# Patient Record
Sex: Male | Born: 1940 | Race: White | Hispanic: No | Smoking: Former smoker
Health system: Southern US, Community
[De-identification: ages and names within clinical notes are randomized; demographics above are authoritative.]

## PROBLEM LIST (undated history)

## (undated) DIAGNOSIS — M159 Polyosteoarthritis, unspecified: Secondary | ICD-10-CM

## (undated) HISTORY — DX: Polyosteoarthritis, unspecified: M15.9

## (undated) HISTORY — PX: TONSILLECTOMY: SUR1361

---

## 1969-06-21 HISTORY — PX: LUMBAR DISC SURGERY: SHX700

## 1989-06-21 HISTORY — PX: KNEE ARTHROSCOPY: SUR90

## 2000-12-18 ENCOUNTER — Encounter: Payer: Self-pay | Admitting: Emergency Medicine

## 2000-12-18 ENCOUNTER — Inpatient Hospital Stay (HOSPITAL_COMMUNITY): Admission: EM | Admit: 2000-12-18 | Discharge: 2000-12-23 | Payer: Self-pay | Admitting: Emergency Medicine

## 2002-04-01 ENCOUNTER — Ambulatory Visit (HOSPITAL_BASED_OUTPATIENT_CLINIC_OR_DEPARTMENT_OTHER): Admission: RE | Admit: 2002-04-01 | Discharge: 2002-04-01 | Payer: Self-pay | Admitting: Orthopedic Surgery

## 2004-06-09 ENCOUNTER — Emergency Department (HOSPITAL_COMMUNITY): Admission: EM | Admit: 2004-06-09 | Discharge: 2004-06-09 | Payer: Self-pay | Admitting: Emergency Medicine

## 2009-09-06 ENCOUNTER — Encounter: Admission: RE | Admit: 2009-09-06 | Discharge: 2009-09-06 | Payer: Self-pay | Admitting: Family Medicine

## 2011-03-08 NOTE — Op Note (Signed)
. [facility]  Patient:    Bryan Thomas, Bryan Thomas Visit Number: [id number] MRN: [id number]          Service Type: DSU Location: [facility] Attending Physician:  Cornell Barman Dictated by:   Lenard Galloway Chaney Malling, M.D. Proc. Date: 04/01/02 Admit Date:  04/01/2002 Discharge Date: 04/01/2002                             Operative Report  PREOPERATIVE DIAGNOSIS:  Internal derangement of left knee.  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  OPERATION:  Arthroscopy, partial medial meniscectomy of left knee.  SURGEON:  Lenard Galloway. Chaney Malling, M.D.  ANESTHESIA:  General.  PATHOLOGY:  With the arthroscope in the knee, a very careful examination of the entire knee was then obtained.  The patellofemoral junction was visualized.  There was grade 2 cartilage damage to the posterior aspect of the patella, and grade 3 cartilage damage in the femoral notch area and osteoarthritis in the femoral notch area.  In the medial compartment, there was a complex tear of the posterior one-half of the medial meniscus.  There was some scuffing of the tibial cartilage over the medial femoral condyle.  In the intercondylar, the ACL was intact.  In the lateral compartment, there were fissures and cartilage tearing about the lateral tibial plateau.  The lateral meniscus appeared fairly normal except for some mild fraying of the leading edge.  DESCRIPTION OF PROCEDURE:  The patient was placed on the operating table in the supine position with a pneumatic tourniquet about the left upper thigh. The left leg was placed in the leg holder and the entire left lower extremity was prepped with DuraPrep, and draped out in the usual manner.  An infusion cannula was placed in the superior medial pouch and the knee distended with saline.  Anteromedial  and anterolateral portals were made and the arthroscope was introduced.  The findings are described as above.  Attention was turned first to the medial compartment.  Through both medial and lateral portals, a series of baskets were inserted, and the complex tear of the medial meniscus was very aggressively debrided.  This was followed up with the intra-articular shaver and all debris was removed.  A smooth stable rim was then developed and this transitioned nicely to the anterior half of the medial meniscus which appeared fairly normal.  There was some fraying of the articular cartilage about the lateral tibial plateau and this was debrided with the chondroplasty shaver.  The leading edge of the lateral meniscus was also widely debrided with the chondroplasty shaver.  The knee was then filled with Marcaine.  A large bulky compressive dressing was applied.  The patient was returned to the recovery room in excellent condition. Technically, this procedure went extremely well.  FOLLOWUP CARE:  See in my office in one week. Dictated by:   Lenard Galloway Chaney Malling, M.D. Attending Physician:  Cornell Barman DD:  04/01/02 TD:  04/03/02 Job: [id number]

## 2011-03-08 NOTE — Cardiovascular Report (Signed)
[place]. [facility]  Patient:    Bryan Thomas, Bryan Thomas                    MRN: [id number] Proc. Date: 12/22/00 Adm. Date:  [id number] Attending:  Talitha Givens CC:         CV Laboratory  Hadassah Pais. Jeannetta Nap, M.D.  Luis Abed, M.D. [facility]   Cardiac Catheterization  INDICATIONS:  Mr. Lebeda presented with chest pain.  His CT scan was negative for PE.  An adenosine Cardiolite revealed inferoseptal ischemia.  As a result further evaluation was recommended.  PROCEDURES: 1. Left heart catheterization. 2. Selective coronary arteriography. 3. Selective left ventriculography.  DESCRIPTION OF PROCEDURE:  The procedure was performed from the right femoral artery using 6 French catheters.  He tolerated the procedure without complication.  When injecting the right coronary, he developed what appeared to be catheter related spasm on some moderate ostial disease.  Because his blood pressure was barely over 90, we were unable to given intracoronary nitroglycerin.  We then gave intravenous fluids and 1500 units of heparin.  We also gave sublingual nitroglycerin and waited for about 10 minutes. Ventriculography was then performed.  We then placed a catheter in the right coronary ostium and followup views were obtained.  There was improving appearance of the right coronary artery with a residual of about 40-50% at the ostium.  The procedure was completed without difficulty.  He was taken to the holding area in satisfactory clinical condition.  HEMODYNAMIC DATA:  The central aortic pressure was 102/68.  LV pressure 99/5. There was no gradient on pullback across the aortic valve.  ANGIOGRAPHIC DATA:  The left main coronary artery was free of critical disease.  The left anterior descending artery demonstrates minimal luminal irregularity. There are a couple of smaller diagonal branches and a septal perforator.  The distal LAD wraps the apex.  No high-grade  stenoses are noted.  The circumflex provides basically three marginal branches which trifurcate. There is minimal luminal irregularity but no high-grade stenosis.  The right coronary artery is a dominant vessel.  There is a moderate sized posterior descending artery which bifurcates in the posterolateral system. Right at the ostium, there appears to be probably around 40-50% narrowing. This is not seen initially the first views because of the takeoff orientation of the right coronary.  Later, with what appears to be catheter related spasm, this was up to 80% in residual luminal narrowing with slightly a slow flow. Following the nitroglycerin, there is 40-50% narrowing at the ostium with a residual mean lumen diameter that appears to be in excess of 3.5 mm.  There is efflux of dye easily with the 2 mm catheter and no damping.  LEFT VENTRICULOGRAPHY:  Ventriculography in the RAO projection reveals adequate systolic function.  No segmental abnormalities contraction are identified.  CONCLUSIONS: 1. 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 2. Preserved overall left ventricular function.  DISPOSITION:  The patient chews a lot of tobacco.  We will recommend that he stop this.  Risk factor reduction will be recommended.  I also will recommend that the patient do an exercise test.  He will follow up with Dr. Myrtis Ser. DD:  12/22/00 TD:  12/23/00 Job: [id number]

## 2011-03-08 NOTE — Discharge Summary (Signed)
[place]. [facility]  Patient:    Bryan Thomas, Bryan Thomas                    MRN: [id number] Adm. Date:  [id number] Disc. Date: 12/23/00 Attending:  Talitha Givens Dictator:   Thad Ranger McNeer, P.A. CC:         [facility]   Discharge Summary  DISCHARGE DIAGNOSIS:  Chest pain status post cardiac catheterization, status post negative exercise tolerance test.  HOSPITAL COURSE:  Patient presented to the ER from his primary care Kristianne Albin complaining of chest pain.  He was seen and admitted by Dr. Willa Rough.  At time of presentation the patient was complaining of generalized fatigue and his chest pain had improved.  He described the chest pain as a band around the chest which worsened somewhat with inspiration.  He also noted the patient had a history of deep vein thrombosis or pulmonary embolus.  In light of this, he felt that it was prudent to rule out pulmonary embolus with a spiral CT.  He also ordered exercise Cardiolite later in the day.  On December 20, 2000, patient still complained of overall weakness, however, he denied chest pain.  Spiral CT revealed no evidence of focal pulmonary embolism.  His Cardiolite exam was abnormal with some inferoseptal ischemia and a normal left ejection fraction.  As a result, Dr. Juanito Doom felt that the patient needed cardiac catheterization.  On December 21, 2000, patient complained of chest pain, ______ , shortness of breath with minimal exertion.  Denied chest pain.  Catheterization was scheduled for the following day.  Patient was taken to the cath lab on Monday, December 22, 2000 by Dr. Bonnee Quin.  He noted a 40-50% lesion in the right coronary artery.  The vessel also spasmed down to 80% with good administration of intracoronary nitroglycerin reduced down to 40%.  Aortic pressure was 102/68, LV pressure was 99/5.  There was no gradient.  Dr. Louretta Shorten recommendations were for medical treatment as well as  a standard GXT Cardiolite and the patient should stop smoking.  On December 23, 2000, patient underwent a GXT Cariolite nuclear scan using the standard Bruce protocol.  Exercise total of 9-1/2 minutes.  There was limited fatigue and no EKG changes noted.  On the basis of this information Dr. Myrtis Ser felt the patient was stable for discharge.  Electrocardiogram reveals sinus bradycardia of approximately 58.  PR interval of 0.190, QRS of 0.124, QTC of 0.426, axis 24.  LABORATORY TESTS:  Cardiac enzymes serially negative for MI.  White count 8.4, hemoglobin 15.0, hematocrit 44.4, platelets 295.  Sodium of 142, potassium 3.8, chloride 108, CO2 31, BUN 14, creatinine 0.8, glucose 96.  TSH 2.448. Cholesterol of 147.  Spiral CT of the chest revealed no sign of pulmonary emboli.  There was also noted some slight enlargement of multiple lymph nodes for nonspecific _____ . Spiral CT of the lower extremities revealed no evidence of deep vein thrombosis.  DISCHARGE MEDICATION:  Enteric-coated aspirin 325 mg q.d.  DISCHARGE INSTRUCTIONS:  Patient was to increase his activity level as tolerated.  He is to eat a low-fat, low-cholesterol diet.  He is to stop smoking.  He is to followup with [facility] as needed or scheduled.  Follow up with Dr. Myrtis Ser in the office in two to four weeks. DD:  12/23/00 TD:  12/24/00 Job: [id number]

## 2016-02-13 ENCOUNTER — Ambulatory Visit (INDEPENDENT_AMBULATORY_CARE_PROVIDER_SITE_OTHER): Payer: PPO | Admitting: Internal Medicine

## 2016-02-13 ENCOUNTER — Encounter: Payer: Self-pay | Admitting: Internal Medicine

## 2016-02-13 ENCOUNTER — Telehealth: Payer: Self-pay

## 2016-02-13 VITALS — BP 108/80 | HR 59 | Temp 98.1°F | Ht 74.0 in | Wt 241.0 lb

## 2016-02-13 DIAGNOSIS — R1900 Intra-abdominal and pelvic swelling, mass and lump, unspecified site: Secondary | ICD-10-CM

## 2016-02-13 DIAGNOSIS — M159 Polyosteoarthritis, unspecified: Secondary | ICD-10-CM | POA: Diagnosis not present

## 2016-02-13 DIAGNOSIS — Z0000 Encounter for general adult medical examination without abnormal findings: Secondary | ICD-10-CM | POA: Insufficient documentation

## 2016-02-13 DIAGNOSIS — R200 Anesthesia of skin: Secondary | ICD-10-CM | POA: Insufficient documentation

## 2016-02-13 DIAGNOSIS — R208 Other disturbances of skin sensation: Secondary | ICD-10-CM

## 2016-02-13 DIAGNOSIS — Z7189 Other specified counseling: Secondary | ICD-10-CM

## 2016-02-13 LAB — CBC WITH DIFFERENTIAL/PLATELET
BASOS ABS: 0 10*3/uL (ref 0.0–0.1)
Basophils Relative: 0.4 % (ref 0.0–3.0)
Eosinophils Absolute: 0.4 10*3/uL (ref 0.0–0.7)
Eosinophils Relative: 3.9 % (ref 0.0–5.0)
HCT: 45.1 % (ref 39.0–52.0)
HEMOGLOBIN: 15.6 g/dL (ref 13.0–17.0)
LYMPHS ABS: 2.8 10*3/uL (ref 0.7–4.0)
Lymphocytes Relative: 30.5 % (ref 12.0–46.0)
MCHC: 34.6 g/dL (ref 30.0–36.0)
MCV: 92.7 fl (ref 78.0–100.0)
MONO ABS: 0.9 10*3/uL (ref 0.1–1.0)
Monocytes Relative: 10.1 % (ref 3.0–12.0)
NEUTROS PCT: 55.1 % (ref 43.0–77.0)
Neutro Abs: 5 10*3/uL (ref 1.4–7.7)
Platelets: 292 10*3/uL (ref 150.0–400.0)
RBC: 4.86 Mil/uL (ref 4.22–5.81)
RDW: 13.6 % (ref 11.5–15.5)
WBC: 9.1 10*3/uL (ref 4.0–10.5)

## 2016-02-13 LAB — VITAMIN B12: Vitamin B-12: 215 pg/mL (ref 211–911)

## 2016-02-13 LAB — COMPREHENSIVE METABOLIC PANEL
ALT: 27 U/L (ref 0–53)
AST: 27 U/L (ref 0–37)
Albumin: 4.2 g/dL (ref 3.5–5.2)
Alkaline Phosphatase: 105 U/L (ref 39–117)
BUN: 16 mg/dL (ref 6–23)
CO2: 27 mEq/L (ref 19–32)
Calcium: 9.5 mg/dL (ref 8.4–10.5)
Chloride: 104 mEq/L (ref 96–112)
Creatinine, Ser: 0.9 mg/dL (ref 0.40–1.50)
GFR: 87.01 mL/min (ref >60.00)
Glucose, Bld: 96 mg/dL (ref 70–99)
Potassium: 4 mEq/L (ref 3.5–5.1)
Sodium: 138 mEq/L (ref 135–145)
Total Bilirubin: 0.7 mg/dL (ref 0.2–1.2)
Total Protein: 7.4 g/dL (ref 6.0–8.3)

## 2016-02-13 LAB — T4, FREE: FREE T4: 0.91 ng/dL (ref 0.60–1.60)

## 2016-02-13 NOTE — Assessment & Plan Note (Signed)
He prefers no cancer screening

## 2016-02-13 NOTE — Progress Notes (Signed)
Pre visit review using our clinic review tool, if applicable. No additional management support is needed unless otherwise documented below in the visit note. 

## 2016-02-13 NOTE — Assessment & Plan Note (Addendum)
Ongoing tobacco use which he won't stop Will check ultrasound  4/27 Reviewed prior physician notes Had ultrasound 09/06/09 which didn't show AAA Can cancel test

## 2016-02-13 NOTE — Assessment & Plan Note (Signed)
Mostly in knees Does okay without meds Discussed acetaminophen or low dose NSAID--but he doesn't need anything now

## 2016-02-13 NOTE — Assessment & Plan Note (Signed)
Mild in feet Will just check labs 

## 2016-02-13 NOTE — Assessment & Plan Note (Signed)
See social history 

## 2016-02-13 NOTE — Progress Notes (Signed)
Subjective:    Patient ID: Bryan Thomas, male    DOB: 1941-03-05, 75 y.o.   MRN: [id number]  HPI Here to establish Had been going out to [place] but now wife comes here  No meds Does have some pain in knees and ankles--mostly knees No meds for this Does work around the house--mechanic work Museum/gallery curator) Now caring for wife more than anything else-- ALzheimers  Gets feeling of pins in bottom of feet at the end of the day This has been for a year Not in hands No weakness  No current outpatient prescriptions on file prior to visit.   No current facility-administered medications on file prior to visit.    Not on File  Past Medical History  Diagnosis Date  . Generalized osteoarthritis of multiple sites     Past Surgical History  Procedure Laterality Date  . Lumbar disc surgery  1970's  . Knee arthroscopy Right 1990's    Family History  Problem Relation Age of Onset  . Cancer Mother   . Diabetes Sister   . Diabetes Brother   . Heart disease Brother     CABG  . Diabetes Brother   . Diabetes Brother     Social History   Social History  . Marital Status: Married    Spouse Name: N/A  . Number of Children: 4  . Years of Education: N/A   Occupational History  . Service work--mechanic     tired   Social History Main Topics  . Smoking status: Former Smoker    Quit date: 10/21/1997  . Smokeless tobacco: Current User    Types: Chew  . Alcohol Use: No  . Drug Use: Not on file  . Sexual Activity: Not on file   Other Topics Concern  . Not on file   Social History Narrative   Has living will   Daughter [name] is health care POA   Would accept resuscitation attempts--but no prolonged artificial ventilation   No tube feeds if cognitively unaware   Review of Systems  Constitutional: Negative for unexpected weight change.       Doesn't wear seat belt   HENT: Positive for hearing loss. Negative for tinnitus.        Teeth okay Sees dentist    Eyes: Negative for visual disturbance.       No diplopia or unilateral vision loss  Respiratory: Negative for cough, chest tightness and shortness of breath.   Cardiovascular: Negative for chest pain, palpitations and leg swelling.  Gastrointestinal: Negative for nausea, vomiting, abdominal pain, constipation and blood in stool.  Endocrine: Negative for polydipsia and polyuria.  Genitourinary:       Mildly slow stream but no dribbling or other problems No nocturia  Musculoskeletal: Positive for arthralgias. Negative for back pain.  Skin: Negative for rash.  Allergic/Immunologic: Negative for environmental allergies and immunocompromised state.  Neurological: Negative for dizziness, syncope, weakness, light-headedness and headaches.  Hematological: Negative for adenopathy. Does not bruise/bleed easily.  Psychiatric/Behavioral: Negative for sleep disturbance and dysphoric mood. The patient is not nervous/anxious.        Objective:   Physical Exam  Constitutional: He appears well-developed and well-nourished. No distress.  Neck: Normal range of motion. Neck supple. No thyromegaly present.  Cardiovascular: Normal rate, regular rhythm, normal heart sounds and intact distal pulses.  Exam reveals no gallop.   No murmur heard. Pulmonary/Chest: Effort normal and breath sounds normal. No respiratory distress. He has no wheezes. He has no  rales.  Abdominal: Soft. There is no tenderness.  Faintly palpable aortic pulsations  Musculoskeletal: He exhibits no edema or tenderness.  Lymphadenopathy:    He has no cervical adenopathy.  Neurological:  Slightly decreased sensation on plantar feet  Skin: No rash noted.  Psychiatric: He has a normal mood and affect. His behavior is normal.          Assessment & Plan:

## 2016-02-13 NOTE — Telephone Encounter (Signed)
lmov to schedule AAA Duplex per Dr. Silvio Pate order.

## 2016-02-26 ENCOUNTER — Encounter: Payer: Self-pay | Admitting: Internal Medicine

## 2016-02-28 ENCOUNTER — Other Ambulatory Visit: Payer: PPO

## 2016-12-19 ENCOUNTER — Telehealth: Payer: Self-pay | Admitting: Internal Medicine

## 2016-12-19 NOTE — Telephone Encounter (Signed)
LVM for pt to call back and schedule AWV/CPE with PCP.

## 2017-01-13 NOTE — Telephone Encounter (Signed)
Left pt message asking to call Allison back directly at [phone] to schedule AWV. Thanks! °

## 2017-02-25 ENCOUNTER — Ambulatory Visit (INDEPENDENT_AMBULATORY_CARE_PROVIDER_SITE_OTHER): Payer: PPO | Admitting: Internal Medicine

## 2017-02-25 ENCOUNTER — Encounter: Payer: Self-pay | Admitting: Internal Medicine

## 2017-02-25 VITALS — BP 110/72 | HR 56 | Temp 97.3°F | Ht 74.0 in | Wt 232.0 lb

## 2017-02-25 DIAGNOSIS — Z0000 Encounter for general adult medical examination without abnormal findings: Secondary | ICD-10-CM | POA: Diagnosis not present

## 2017-02-25 DIAGNOSIS — M159 Polyosteoarthritis, unspecified: Secondary | ICD-10-CM | POA: Diagnosis not present

## 2017-02-25 DIAGNOSIS — R200 Anesthesia of skin: Secondary | ICD-10-CM | POA: Diagnosis not present

## 2017-02-25 DIAGNOSIS — Z23 Encounter for immunization: Secondary | ICD-10-CM

## 2017-02-25 DIAGNOSIS — Z7189 Other specified counseling: Secondary | ICD-10-CM | POA: Diagnosis not present

## 2017-02-25 LAB — COMPREHENSIVE METABOLIC PANEL
ALT: 22 U/L (ref 0–53)
AST: 21 U/L (ref 0–37)
Albumin: 4.1 g/dL (ref 3.5–5.2)
Alkaline Phosphatase: 90 U/L (ref 39–117)
BUN: 17 mg/dL (ref 6–23)
CHLORIDE: 105 meq/L (ref 96–112)
CO2: 28 mEq/L (ref 19–32)
Calcium: 9.3 mg/dL (ref 8.4–10.5)
Creatinine, Ser: 0.79 mg/dL (ref 0.40–1.50)
GFR: 100.86 mL/min (ref >60.00)
GLUCOSE: 96 mg/dL (ref 70–99)
POTASSIUM: 4.5 meq/L (ref 3.5–5.1)
SODIUM: 138 meq/L (ref 135–145)
Total Bilirubin: 0.7 mg/dL (ref 0.2–1.2)
Total Protein: 7 g/dL (ref 6.0–8.3)

## 2017-02-25 LAB — CBC WITH DIFFERENTIAL/PLATELET
Basophils Absolute: 0 10*3/uL (ref 0.0–0.1)
Basophils Relative: 0.3 % (ref 0.0–3.0)
EOS PCT: 4.2 % (ref 0.0–5.0)
Eosinophils Absolute: 0.3 10*3/uL (ref 0.0–0.7)
HCT: 44.6 % (ref 39.0–52.0)
HEMOGLOBIN: 14.9 g/dL (ref 13.0–17.0)
LYMPHS ABS: 2.4 10*3/uL (ref 0.7–4.0)
Lymphocytes Relative: 31.1 % (ref 12.0–46.0)
MCHC: 33.3 g/dL (ref 30.0–36.0)
MCV: 93.6 fl (ref 78.0–100.0)
MONO ABS: 0.8 10*3/uL (ref 0.1–1.0)
MONOS PCT: 10 % (ref 3.0–12.0)
Neutro Abs: 4.1 10*3/uL (ref 1.4–7.7)
Neutrophils Relative %: 54.4 % (ref 43.0–77.0)
Platelets: 277 10*3/uL (ref 150.0–400.0)
RBC: 4.77 Mil/uL (ref 4.22–5.81)
RDW: 13.3 % (ref 11.5–15.5)
WBC: 7.6 10*3/uL (ref 4.0–10.5)

## 2017-02-25 LAB — VITAMIN B12: VITAMIN B 12: 491 pg/mL (ref 211–911)

## 2017-02-25 NOTE — Assessment & Plan Note (Signed)
I have personally reviewed the Medicare Annual Wellness questionnaire and have noted 1. The patient's medical and social history 2. Their use of alcohol, tobacco or illicit drugs 3. Their current medications and supplements 4. The patient's functional ability including ADL's, fall risks, home safety risks and hearing or visual             impairment. 5. Diet and physical activities 6. Evidence for depression or mood disorders  The patients weight, height, BMI and visual acuity have been recorded in the chart I have made referrals, counseling and provided education to the patient based review of the above and I have provided the pt with a written personalized care plan for preventive services.  I have provided you with a copy of your personalized plan for preventive services. Please take the time to review along with your updated medication list.  Discussed healthy eating---drinks sugared drinks, etc Will accept pneumovax with coaxing No cancer screening due to age 76 active

## 2017-02-25 NOTE — Progress Notes (Signed)
Subjective:    Patient ID: Bryan Thomas, male    DOB: 11/02/1940, 76 y.o.   MRN: [id number]  HPI Here for Medicare wellness and follow up of chronic medical conditions Reviewed form and advanced directives Reviewed other doctors--not really anyone else No alcohol Chewing tobacco--- doesn't want to stop Not really exercising but stays busy--wife's care, yard work, etc No falls No depression or anhedonia Independent with instrumental ADLs and does all care for wife Vision and hearing are okay No apparent memory problems  Still has tingling on the bottom of his feet Does have vitamin B12 which he uses intermittently (~ every other day) No progression  Still with pain in his knees Has tried arthritis tylenol--- not really helpful Hasn't tried NSAIDs Doesn't find this limiting  Has nevus on face Daughter wants it checked No recent change  No current outpatient prescriptions on file prior to visit.   No current facility-administered medications on file prior to visit.     Not on File  Past Medical History:  Diagnosis Date  . Generalized osteoarthritis of multiple sites     Past Surgical History:  Procedure Laterality Date  . KNEE ARTHROSCOPY Right 1990's  . LUMBAR DISC SURGERY  1970's    Family History  Problem Relation Age of Onset  . Cancer Mother   . Diabetes Sister   . Diabetes Brother   . Heart disease Brother     CABG  . Diabetes Brother   . Diabetes Brother     Social History   Social History  . Marital status: Married    Spouse name: N/A  . Number of children: 4  . Years of education: N/A   Occupational History  . Service work--mechanic     tired   Social History Main Topics  . Smoking status: Former Smoker    Quit date: 10/21/1997  . Smokeless tobacco: Current User    Types: Chew  . Alcohol use No  . Drug use: Unknown  . Sexual activity: Not on file   Other Topics Concern  . Not on file   Social History Narrative   Has living  will   Daughter [name] is health care POA   Would accept resuscitation attempts--but no prolonged artificial ventilation   No tube feeds if cognitively unaware   Review of Systems Teeth are okay--some grinding. Keeps up with Dr Tye Savoy. No oral lesions. Appetite is good Weight fairly stable Sleeps well Not consistent with seat belt--rarely. Counseled about this. Bowels are fine. No blood Occasional burning when he voids. Slow stream. No dribbling. No nocturia. He doesn't think this is infection No other skin lesions No chest pain or SOB No palpitations No dizziness or syncope No significant edema    Objective:   Physical Exam  Constitutional: He is oriented to person, place, and time. He appears well-nourished. No distress.  HENT:  Mouth/Throat: Oropharynx is clear and moist. No oropharyngeal exudate.  Neck: No thyromegaly present.  Cardiovascular: Normal rate, regular rhythm, normal heart sounds and intact distal pulses.  Exam reveals no gallop.   No murmur heard. Pulmonary/Chest: Effort normal and breath sounds normal. No respiratory distress. He has no wheezes. He has no rales.  Abdominal: Soft. Bowel sounds are normal. He exhibits no distension. There is no tenderness. There is no rebound and no guarding.  Musculoskeletal: He exhibits no edema or tenderness.  Lymphadenopathy:    He has no cervical adenopathy.  Neurological: He is alert and oriented to person, place,  and time.  President-- "Dwaine Deter, Bush" [phone] [id number] Recall 2/3  Skin: No rash noted. No erythema.  seb keratosis left preauricular area  Psychiatric: He has a normal mood and affect. His behavior is normal.          Assessment & Plan:

## 2017-02-25 NOTE — Assessment & Plan Note (Signed)
See social history 

## 2017-02-25 NOTE — Progress Notes (Signed)
Pre visit review using our clinic review tool, if applicable. No additional management support is needed unless otherwise documented below in the visit note. 

## 2017-02-25 NOTE — Addendum Note (Signed)
Addended by: Pilar Grammes on: 02/25/2017 11:38 AM   Modules accepted: Orders

## 2017-02-25 NOTE — Assessment & Plan Note (Signed)
Mild pain Okay to try occasional OTC NSAID

## 2017-02-25 NOTE — Patient Instructions (Addendum)
Call to set up an appointment if you want to try a cortisone shot in your knee.  DASH Eating Plan DASH stands for "Dietary Approaches to Stop Hypertension." The DASH eating plan is a healthy eating plan that has been shown to reduce high blood pressure (hypertension). It may also reduce your risk for type 2 diabetes, heart disease, and stroke. The DASH eating plan may also help with weight loss. What are tips for following this plan? General guidelines   Avoid eating more than 2,300 mg (milligrams) of salt (sodium) a day. If you have hypertension, you may need to reduce your sodium intake to 1,500 mg a day.  Limit alcohol intake to no more than 1 drink a day for nonpregnant women and 2 drinks a day for men. One drink equals 12 oz of beer, 5 oz of wine, or 1 oz of hard liquor.  Work with your health care provider to maintain a healthy body weight or to lose weight. Ask what an ideal weight is for you.  Get at least 30 minutes of exercise that causes your heart to beat faster (aerobic exercise) most days of the week. Activities may include walking, swimming, or biking.  Work with your health care provider or diet and nutrition specialist (dietitian) to adjust your eating plan to your individual calorie needs. Reading food labels   Check food labels for the amount of sodium per serving. Choose foods with less than 5 percent of the Daily Value of sodium. Generally, foods with less than 300 mg of sodium per serving fit into this eating plan.  To find whole grains, look for the word "whole" as the first word in the ingredient list. Shopping   Buy products labeled as "low-sodium" or "no salt added."  Buy fresh foods. Avoid canned foods and premade or frozen meals. Cooking   Avoid adding salt when cooking. Use salt-free seasonings or herbs instead of table salt or sea salt. Check with your health care provider or pharmacist before using salt substitutes.  Do not fry foods. Cook foods using  healthy methods such as baking, boiling, grilling, and broiling instead.  Cook with heart-healthy oils, such as olive, canola, soybean, or sunflower oil. Meal planning    Eat a balanced diet that includes:  5 or more servings of fruits and vegetables each day. At each meal, try to fill half of your plate with fruits and vegetables.  Up to 6-8 servings of whole grains each day.  Less than 6 oz of lean meat, poultry, or fish each day. A 3-oz serving of meat is about the same size as a deck of cards. One egg equals 1 oz.  2 servings of low-fat dairy each day.  A serving of nuts, seeds, or beans 5 times each week.  Heart-healthy fats. Healthy fats called Omega-3 fatty acids are found in foods such as flaxseeds and coldwater fish, like sardines, salmon, and mackerel.  Limit how much you eat of the following:  Canned or prepackaged foods.  Food that is high in trans fat, such as fried foods.  Food that is high in saturated fat, such as fatty meat.  Sweets, desserts, sugary drinks, and other foods with added sugar.  Full-fat dairy products.  Do not salt foods before eating.  Try to eat at least 2 vegetarian meals each week.  Eat more home-cooked food and less restaurant, buffet, and fast food.  When eating at a restaurant, ask that your food be prepared with less salt or  no salt, if possible. What foods are recommended? The items listed may not be a complete list. Talk with your dietitian about what dietary choices are best for you. Grains  Whole-grain or whole-wheat bread. Whole-grain or whole-wheat pasta. Brown rice. [place]. Bulgur. Whole-grain and low-sodium cereals. Pita bread. Low-fat, low-sodium crackers. Whole-wheat flour tortillas. Vegetables  Fresh or frozen vegetables (raw, steamed, roasted, or grilled). Low-sodium or reduced-sodium tomato and vegetable juice. Low-sodium or reduced-sodium tomato sauce and tomato paste. Low-sodium or reduced-sodium canned  vegetables. Fruits  All fresh, dried, or frozen fruit. Canned fruit in natural juice (without added sugar). Meat and other protein foods  Skinless chicken or [place]. Ground chicken or [place]. Pork with fat trimmed off. Fish and seafood. Egg whites. Dried beans, peas, or lentils. Unsalted nuts, nut butters, and seeds. Unsalted canned beans. Lean cuts of beef with fat trimmed off. Low-sodium, lean deli meat. Dairy  Low-fat (1%) or fat-free (skim) milk. Fat-free, low-fat, or reduced-fat cheeses. Nonfat, low-sodium ricotta or cottage cheese. Low-fat or nonfat yogurt. Low-fat, low-sodium cheese. Fats and oils  Soft margarine without trans fats. Vegetable oil. Low-fat, reduced-fat, or light mayonnaise and salad dressings (reduced-sodium). Canola, safflower, olive, soybean, and sunflower oils. Avocado. Seasoning and other foods  Herbs. Spices. Seasoning mixes without salt. Unsalted popcorn and pretzels. Fat-free sweets. What foods are not recommended? The items listed may not be a complete list. Talk with your dietitian about what dietary choices are best for you. Grains  Baked goods made with fat, such as croissants, muffins, or some breads. Dry pasta or rice meal packs. Vegetables  Creamed or fried vegetables. Vegetables in a cheese sauce. Regular canned vegetables (not low-sodium or reduced-sodium). Regular canned tomato sauce and paste (not low-sodium or reduced-sodium). Regular tomato and vegetable juice (not low-sodium or reduced-sodium). Angie Fava. Olives. Fruits  Canned fruit in a light or heavy syrup. Fried fruit. Fruit in cream or butter sauce. Meat and other protein foods  Fatty cuts of meat. Ribs. Fried meat. Berniece Salines. Sausage. Bologna and other processed lunch meats. Salami. Fatback. Hotdogs. Bratwurst. Salted nuts and seeds. Canned beans with added salt. Canned or smoked fish. Whole eggs or egg yolks. Chicken or [place] with skin. Dairy  Whole or 2% milk, cream, and half-and-half. Whole or  full-fat cream cheese. Whole-fat or sweetened yogurt. Full-fat cheese. Nondairy creamers. Whipped toppings. Processed cheese and cheese spreads. Fats and oils  Butter. Stick margarine. Lard. Shortening. Ghee. Bacon fat. Tropical oils, such as coconut, palm kernel, or palm oil. Seasoning and other foods  Salted popcorn and pretzels. Onion salt, garlic salt, seasoned salt, table salt, and sea salt. Worcestershire sauce. Tartar sauce. Barbecue sauce. Teriyaki sauce. Soy sauce, including reduced-sodium. Steak sauce. Canned and packaged gravies. Fish sauce. Oyster sauce. Cocktail sauce. Horseradish that you find on the shelf. Ketchup. Mustard. Meat flavorings and tenderizers. Bouillon cubes. Hot sauce and Tabasco sauce. Premade or packaged marinades. Premade or packaged taco seasonings. Relishes. Regular salad dressings. Where to find more information:  National Heart, Lung, and [place]: https://wilson-eaton.com/  American Heart Association: www.heart.org Summary  The DASH eating plan is a healthy eating plan that has been shown to reduce high blood pressure (hypertension). It may also reduce your risk for type 2 diabetes, heart disease, and stroke.  With the DASH eating plan, you should limit salt (sodium) intake to 2,300 mg a day. If you have hypertension, you may need to reduce your sodium intake to 1,500 mg a day.  When on the DASH eating plan, aim  to eat more fresh fruits and vegetables, whole grains, lean proteins, low-fat dairy, and heart-healthy fats.  Work with your health care provider or diet and nutrition specialist (dietitian) to adjust your eating plan to your individual calorie needs. This information is not intended to replace advice given to you by your health care provider. Make sure you discuss any questions you have with your health care provider. Document Released: 09/26/2011 Document Revised: 09/30/2016 Document Reviewed: 09/30/2016 Elsevier Interactive Patient Education  2017  [employer].

## 2017-02-25 NOTE — Assessment & Plan Note (Signed)
Likely related to B12 Will recheck level

## 2017-08-04 ENCOUNTER — Ambulatory Visit (INDEPENDENT_AMBULATORY_CARE_PROVIDER_SITE_OTHER): Payer: PPO | Admitting: Internal Medicine

## 2017-08-04 ENCOUNTER — Encounter: Payer: Self-pay | Admitting: Internal Medicine

## 2017-08-04 VITALS — BP 104/70 | HR 57 | Temp 97.9°F | Wt 226.0 lb

## 2017-08-04 DIAGNOSIS — L03031 Cellulitis of right toe: Secondary | ICD-10-CM | POA: Insufficient documentation

## 2017-08-04 DIAGNOSIS — R200 Anesthesia of skin: Secondary | ICD-10-CM

## 2017-08-04 DIAGNOSIS — S335XXA Sprain of ligaments of lumbar spine, initial encounter: Secondary | ICD-10-CM | POA: Diagnosis not present

## 2017-08-04 MED ORDER — CEPHALEXIN 500 MG PO TABS: 500.0000 mg | ORAL_TABLET | Freq: Three times a day (TID) | ORAL | 1 refills | Status: DC

## 2017-08-04 NOTE — Assessment & Plan Note (Signed)
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

## 2017-08-04 NOTE — Assessment & Plan Note (Deleted)
Some degree of infection--may have followed some minor trauma Better now after the drainage, discussed ongoing soaks as needed Will try 3 days of cephalexin

## 2017-08-04 NOTE — Progress Notes (Addendum)
   Subjective:    Patient ID: Bryan Thomas, male    DOB: [birth year], 76 y.o.   MRN: [id number]  HPI Here with wife and daughter  All last week, had headache and dizzy feeling Was wobbly when walking Ongoing burning on bottom of feet--and pins and needles feeling--worse than usual Seemed better so went out with chain saw 3 days ago Worked up a good sweat---and felt good.  Feet felt better after that Now with pain in his right hip No prior pain there  Trouble getting up  Can't raise his leg  Can walk okay once he loosens up No prior problems with that hip No medications for this  No current outpatient prescriptions on file prior to visit.   No current facility-administered medications on file prior to visit.     No Known Allergies  Past Medical History:  Diagnosis Date  . Generalized osteoarthritis of multiple sites     Past Surgical History:  Procedure Laterality Date  . KNEE ARTHROSCOPY Right 1990's  . LUMBAR DISC SURGERY  1970's    Family History  Problem Relation Age of Onset  . Cancer Mother   . Diabetes Sister   . Diabetes Brother   . Heart disease Brother        CABG  . Diabetes Brother   . Diabetes Brother     Social History   Social History  . Marital status: Married    Spouse name: N/A  . Number of children: 4  . Years of education: N/A   Occupational History  . Service work--mechanic     tired   Social History Main Topics  . Smoking status: Former Smoker    Quit date: 10/21/1997  . Smokeless tobacco: Current User    Types: Chew  . Alcohol use No  . Drug use: Unknown  . Sexual activity: Not on file   Other Topics Concern  . Not on file   Social History Narrative   Has living will   Daughter [name] is health care POA   Would accept resuscitation attempts--but no prolonged artificial ventilation   No tube feeds if cognitively unaware   Review of Systems  No back pain No bladder or bowel changes    Objective:   Physical Exam   Constitutional: No distress.  Musculoskeletal:  Tenderness along right flank--above the hip Trouble even getting up from chair or flexing back--but does loosen up some ROM in hip is fairly normal  Neurological:  Trouble flexing at right hip---but strength otherwise fairly normal   Skin:             Assessment & Plan:

## 2017-08-04 NOTE — Assessment & Plan Note (Signed)
Seems to be muscular Spine and hip don't seem to be involved Not a fan of meds--- discussed using heat though

## 2017-11-11 ENCOUNTER — Encounter: Payer: Self-pay | Admitting: Internal Medicine

## 2017-11-11 ENCOUNTER — Ambulatory Visit (INDEPENDENT_AMBULATORY_CARE_PROVIDER_SITE_OTHER)
Admission: RE | Admit: 2017-11-11 | Discharge: 2017-11-11 | Disposition: A | Discharge status: Home / Self Care | Payer: PPO | Source: Ambulatory Visit | Attending: Internal Medicine | Admitting: Internal Medicine

## 2017-11-11 ENCOUNTER — Other Ambulatory Visit: Payer: Self-pay | Admitting: Internal Medicine

## 2017-11-11 ENCOUNTER — Ambulatory Visit (INDEPENDENT_AMBULATORY_CARE_PROVIDER_SITE_OTHER): Payer: PPO | Admitting: Internal Medicine

## 2017-11-11 ENCOUNTER — Ambulatory Visit
Admission: RE | Admit: 2017-11-11 | Discharge: 2017-11-11 | Disposition: A | Discharge status: Home / Self Care | Payer: PPO | Source: Ambulatory Visit | Attending: Internal Medicine | Admitting: Internal Medicine

## 2017-11-11 VITALS — BP 120/76 | HR 84 | Temp 97.7°F | Wt 217.0 lb

## 2017-11-11 DIAGNOSIS — Y92009 Unspecified place in unspecified non-institutional (private) residence as the place of occurrence of the external cause: Secondary | ICD-10-CM

## 2017-11-11 DIAGNOSIS — W19XXXA Unspecified fall, initial encounter: Secondary | ICD-10-CM | POA: Diagnosis not present

## 2017-11-11 DIAGNOSIS — S82143A Displaced bicondylar fracture of unspecified tibia, initial encounter for closed fracture: Secondary | ICD-10-CM

## 2017-11-11 DIAGNOSIS — S8992XA Unspecified injury of left lower leg, initial encounter: Secondary | ICD-10-CM | POA: Diagnosis not present

## 2017-11-11 DIAGNOSIS — M25561 Pain in right knee: Secondary | ICD-10-CM

## 2017-11-11 DIAGNOSIS — M25562 Pain in left knee: Secondary | ICD-10-CM | POA: Diagnosis not present

## 2017-11-11 DIAGNOSIS — S8991XA Unspecified injury of right lower leg, initial encounter: Secondary | ICD-10-CM | POA: Diagnosis not present

## 2017-11-11 MED ORDER — TRAMADOL HCL 50 MG PO TABS
50.0000 mg | ORAL_TABLET | Freq: Three times a day (TID) | ORAL | 0 refills | Status: DC | PRN
Start: 2017-11-11 — End: 2018-03-03

## 2017-11-11 NOTE — Progress Notes (Signed)
Subjective:    Patient ID: Bryan Thomas, male    DOB: [birth year], 77 y.o.   MRN: [id number]  HPI  Pt presents to the clinic today with c/o bilateral knee pain. He reports this started 2 days ago, after he fell on a concrete floor. He reports he tripped and landed on his knees. He describes the pain as. The left is worse than the right. The pain is worse with weight bearing. He denies numbness or tingling but has noticed some weakness. He has tried Ibuprofen without any relief. He reports he has had an arthroscopic surgery on his right knee.   Review of Systems  Past Medical History:  Diagnosis Date  . Generalized osteoarthritis of multiple sites     No current outpatient medications on file.   No current facility-administered medications for this visit.     No Known Allergies  Family History  Problem Relation Age of Onset  . Cancer Mother   . Diabetes Sister   . Diabetes Brother   . Heart disease Brother        CABG  . Diabetes Brother   . Diabetes Brother     Social History   Socioeconomic History  . Marital status: Married    Spouse name: Not on file  . Number of children: 4  . Years of education: Not on file  . Highest education level: Not on file  Social Needs  . Financial resource strain: Not on file  . Food insecurity - worry: Not on file  . Food insecurity - inability: Not on file  . Transportation needs - medical: Not on file  . Transportation needs - non-medical: Not on file  Occupational History  . Occupation: TEFL teacher    Comment: tired  Tobacco Use  . Smoking status: Former Smoker    Last attempt to quit: 10/21/1997    Years since quitting: 20.0  . Smokeless tobacco: Current User    Types: Chew  Substance and Sexual Activity  . Alcohol use: No    Alcohol/week: 0.0 oz  . Drug use: Not on file  . Sexual activity: Not on file  Other Topics Concern  . Not on file  Social History Narrative   Has living will   Daughter [name] is  health care POA   Would accept resuscitation attempts--but no prolonged artificial ventilation   No tube feeds if cognitively unaware     Constitutional: Denies fever, malaise, fatigue, headache or abrupt weight changes.  Musculoskeletal: Pt reports bilateral knee pain, difficulty with gait.    No other specific complaints in a complete review of systems (except as listed in HPI above).     Objective:   Physical Exam   BP 120/76   Pulse 84   Temp 97.7 F (36.5 C) (Oral)   Wt 217 lb (98.4 kg)   BMI 27.86 kg/m  Wt Readings from Last 3 Encounters:  11/11/17 217 lb (98.4 kg)  08/04/17 226 lb (102.5 kg)  02/25/17 232 lb (105.2 kg)    General: Appears his stated age, in NAD. Skin: Warm, dry and intact. Thickened skin noted over the knees. Musculoskeletal: Normal flexion and extension of the right knee. Decreased flexion and extension of the left knee. Joint swelling noted L>R. Pain with palpation of the medial joint line, right knee. Pain with palpation of the medial and lateral joint line left knee. Gait slow but steady.    BMET    Component Value Date/Time   NA 138  02/25/2017 1119   K 4.5 02/25/2017 1119   CL 105 02/25/2017 1119   CO2 28 02/25/2017 1119   GLUCOSE 96 02/25/2017 1119   BUN 17 02/25/2017 1119   CREATININE 0.79 02/25/2017 1119   CALCIUM 9.3 02/25/2017 1119    Lipid Panel  No results found for: CHOL, TRIG, HDL, CHOLHDL, VLDL, LDLCALC  CBC    Component Value Date/Time   WBC 7.6 02/25/2017 1119   RBC 4.77 02/25/2017 1119   HGB 14.9 02/25/2017 1119   HCT 44.6 02/25/2017 1119   PLT 277.0 02/25/2017 1119   MCV 93.6 02/25/2017 1119   MCHC 33.3 02/25/2017 1119   RDW 13.3 02/25/2017 1119   LYMPHSABS 2.4 02/25/2017 1119   MONOABS 0.8 02/25/2017 1119   EOSABS 0.3 02/25/2017 1119   BASOSABS 0.0 02/25/2017 1119    Hgb A1C No results found for: HGBA1C         Assessment & Plan:   Bilateral Knee Pain s/p Fall:  He is obviously in pain but  declines IM Toradol or RX for Tramadol or Norco Advised him to take Ibuprofen OTC Ice may be helpful Xray bilateral knees today- will follow up after xray results are back Offered to wrap both knees in ACE for support but he declines  Return precautions discussed Webb Silversmith, NP

## 2017-11-11 NOTE — Patient Instructions (Signed)

## 2017-11-12 DIAGNOSIS — M170 Bilateral primary osteoarthritis of knee: Secondary | ICD-10-CM | POA: Diagnosis not present

## 2018-03-03 ENCOUNTER — Ambulatory Visit (INDEPENDENT_AMBULATORY_CARE_PROVIDER_SITE_OTHER): Payer: PPO | Admitting: Internal Medicine

## 2018-03-03 ENCOUNTER — Encounter: Payer: Self-pay | Admitting: Internal Medicine

## 2018-03-03 VITALS — BP 116/60 | HR 76 | Temp 97.4°F | Ht 74.0 in | Wt 232.0 lb

## 2018-03-03 DIAGNOSIS — R200 Anesthesia of skin: Secondary | ICD-10-CM | POA: Diagnosis not present

## 2018-03-03 DIAGNOSIS — Z0000 Encounter for general adult medical examination without abnormal findings: Secondary | ICD-10-CM | POA: Diagnosis not present

## 2018-03-03 DIAGNOSIS — Z7189 Other specified counseling: Secondary | ICD-10-CM

## 2018-03-03 DIAGNOSIS — M159 Polyosteoarthritis, unspecified: Secondary | ICD-10-CM

## 2018-03-03 LAB — COMPREHENSIVE METABOLIC PANEL
ALT: 21 U/L (ref 0–53)
AST: 21 U/L (ref 0–37)
Albumin: 3.9 g/dL (ref 3.5–5.2)
Alkaline Phosphatase: 106 U/L (ref 39–117)
BILIRUBIN TOTAL: 0.5 mg/dL (ref 0.2–1.2)
BUN: 19 mg/dL (ref 6–23)
CHLORIDE: 105 meq/L (ref 96–112)
CO2: 27 meq/L (ref 19–32)
CREATININE: 0.76 mg/dL (ref 0.40–1.50)
Calcium: 9.1 mg/dL (ref 8.4–10.5)
GFR: 105.19 mL/min (ref >60.00)
Glucose, Bld: 107 mg/dL — ABNORMAL HIGH (ref 70–99)
Potassium: 4.2 mEq/L (ref 3.5–5.1)
SODIUM: 140 meq/L (ref 135–145)
Total Protein: 7.1 g/dL (ref 6.0–8.3)

## 2018-03-03 LAB — CBC
HCT: 44.8 % (ref 39.0–52.0)
HEMOGLOBIN: 15.4 g/dL (ref 13.0–17.0)
MCHC: 34.4 g/dL (ref 30.0–36.0)
MCV: 93.9 fl (ref 78.0–100.0)
PLATELETS: 253 10*3/uL (ref 150.0–400.0)
RBC: 4.77 Mil/uL (ref 4.22–5.81)
RDW: 13.4 % (ref 11.5–15.5)
WBC: 6.6 10*3/uL (ref 4.0–10.5)

## 2018-03-03 LAB — T4, FREE: FREE T4: 0.66 ng/dL (ref 0.60–1.60)

## 2018-03-03 LAB — VITAMIN B12: Vitamin B-12: 322 pg/mL (ref 211–911)

## 2018-03-03 NOTE — Progress Notes (Signed)
Subjective:    Patient ID: Bryan Thomas, male    DOB: 1941-02-07, 77 y.o.   MRN: [id number]  HPI Here for Medicare wellness visit and follow up of chronic health conditions Reviewed advanced directives Reviewed other doctors Rare beer Still chewing tobacco--doesn't want to give up No depression or anhedonia No set exercise but caregiver for wife, yardwork, etc Bryan Thomas does all the instrumental ADLs Vision is fine Hearing is not great---not interested in hearing aide No memory issues  Ongoing sensory changes in feet---tingling in bottom of feet Also has sensation along outside of left chest ---"sensation"--when rolling in bed or getting up a certain way. Feels it in shoulder and down arm to elbow No chest pain No SOB No dizziness or syncope  Still with knee pain Ongoing problem--plus the fall with injury a few months ago  No current outpatient medications on file prior to visit.   No current facility-administered medications on file prior to visit.     No Known Allergies  Past Medical History:  Diagnosis Date  . Generalized osteoarthritis of multiple sites     Past Surgical History:  Procedure Laterality Date  . KNEE ARTHROSCOPY Right 1990's  . LUMBAR DISC SURGERY  1970's    Family History  Problem Relation Age of Onset  . Cancer Mother   . Diabetes Sister   . Diabetes Brother   . Heart disease Brother        CABG  . Diabetes Brother   . Diabetes Brother     Social History   Socioeconomic History  . Marital status: Married    Spouse name: Not on file  . Number of children: 4  . Years of education: Not on file  . Highest education level: Not on file  Occupational History  . Occupation: TEFL teacher    Comment: tired  Social Needs  . Financial resource strain: Not on file  . Food insecurity:    Worry: Not on file    Inability: Not on file  . Transportation needs:    Medical: Not on file    Non-medical: Not on file  Tobacco Use  .  Smoking status: Former Smoker    Last attempt to quit: 10/21/1997    Years since quitting: 20.3  . Smokeless tobacco: Current User    Types: Chew  Substance and Sexual Activity  . Alcohol use: No    Alcohol/week: 0.0 oz  . Drug use: Not on file  . Sexual activity: Not on file  Lifestyle  . Physical activity:    Days per week: Not on file    Minutes per session: Not on file  . Stress: Not on file  Relationships  . Social connections:    Talks on phone: Not on file    Gets together: Not on file    Attends religious service: Not on file    Active member of club or organization: Not on file    Attends meetings of clubs or organizations: Not on file    Relationship status: Not on file  . Intimate partner violence:    Fear of current or ex partner: Not on file    Emotionally abused: Not on file    Physically abused: Not on file    Forced sexual activity: Not on file  Other Topics Concern  . Not on file  Social History Narrative   Has living will   Daughter [name] is health care POA   Would accept resuscitation attempts--but no prolonged  artificial ventilation   No tube feeds if cognitively unaware   Review of Systems Sleeps well Appetite is good Weight overall stable Doesn't wear seat belt---discussed Teeth okay--does see dentist (needs new one after Tye Savoy retired) No skin problems --- no dermatologist Bowels are fine--no blood Voids okay--slow stream but no major issues. No nocturia No heartburn or dysphagia    Objective:   Physical Exam  Constitutional: Bryan Thomas is oriented to person, place, and time. Bryan Thomas appears well-developed. No distress.  HENT:  Mouth/Throat: Oropharynx is clear and moist. No oropharyngeal exudate.  Neck: No thyromegaly present.  Cardiovascular: Normal rate, regular rhythm, normal heart sounds and intact distal pulses. Exam reveals no gallop.  No murmur heard. Pulmonary/Chest: Effort normal and breath sounds normal. No respiratory distress. Bryan Thomas has no  wheezes. Bryan Thomas has no rales.  Abdominal: Soft. There is no tenderness.  Musculoskeletal: Bryan Thomas exhibits no edema or tenderness.  Lymphadenopathy:    Bryan Thomas has no cervical adenopathy.  Neurological: Bryan Thomas is alert and oriented to person, place, and time.  President--- "Sheela Stack----- Obama" [id number] Recall 2/3  Skin: Skin is warm. No rash noted.  Psychiatric: Bryan Thomas has a normal mood and affect. His behavior is normal.          Assessment & Plan:

## 2018-03-03 NOTE — Assessment & Plan Note (Signed)
I have personally reviewed the Medicare Annual Wellness questionnaire and have noted 1. The patient's medical and social history 2. Their use of alcohol, tobacco or illicit drugs 3. Their current medications and supplements 4. The patient's functional ability including ADL's, fall risks, home safety risks and hearing or visual             impairment. 5. Diet and physical activities 6. Evidence for depression or mood disorders  The patients weight, height, BMI and visual acuity have been recorded in the chart I have made referrals, counseling and provided education to the patient based review of the above and I have provided the pt with a written personalized care plan for preventive services.  I have provided you with a copy of your personalized plan for preventive services. Please take the time to review along with your updated medication list.  Doesn't want cancer screening--okay at his age Still prefers no flu vaccine --discussed Defer shingrix (and doesn't want) Not willing to give up chewing tobacco Stays fairly active

## 2018-03-03 NOTE — Assessment & Plan Note (Signed)
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

## 2018-03-03 NOTE — Assessment & Plan Note (Signed)
Mostly knees Does okay with occasional tylenol or advil

## 2018-03-03 NOTE — Assessment & Plan Note (Signed)
See social history 

## 2018-03-03 NOTE — Progress Notes (Signed)
Hearing Screening (Inadequate exam)   Method: Audiometry   125Hz  250Hz  500Hz  1000Hz  2000Hz  3000Hz  4000Hz  6000Hz  8000Hz   Right ear:   40 0 0  0    Left ear:   0 0 0  0      Visual Acuity Screening   Right eye Left eye Both eyes  Without correction: 20/30 20/30 20/30   With correction:

## 2019-05-20 ENCOUNTER — Other Ambulatory Visit: Payer: Self-pay

## 2020-05-16 ENCOUNTER — Other Ambulatory Visit: Payer: Self-pay

## 2021-03-15 ENCOUNTER — Encounter: Payer: PPO | Admitting: Internal Medicine

## 2021-03-23 ENCOUNTER — Ambulatory Visit (INDEPENDENT_AMBULATORY_CARE_PROVIDER_SITE_OTHER): Payer: PPO | Admitting: Internal Medicine

## 2021-03-23 ENCOUNTER — Other Ambulatory Visit: Payer: Self-pay

## 2021-03-23 ENCOUNTER — Encounter: Payer: Self-pay | Admitting: Internal Medicine

## 2021-03-23 VITALS — BP 132/80 | HR 59 | Temp 98.0°F | Ht 74.5 in | Wt 253.0 lb

## 2021-03-23 DIAGNOSIS — Z0000 Encounter for general adult medical examination without abnormal findings: Secondary | ICD-10-CM

## 2021-03-23 DIAGNOSIS — R200 Anesthesia of skin: Secondary | ICD-10-CM | POA: Diagnosis not present

## 2021-03-23 DIAGNOSIS — Z7189 Other specified counseling: Secondary | ICD-10-CM

## 2021-03-23 DIAGNOSIS — M159 Polyosteoarthritis, unspecified: Secondary | ICD-10-CM

## 2021-03-23 LAB — VITAMIN B12: Vitamin B-12: 239 pg/mL (ref 211–911)

## 2021-03-23 LAB — CBC
HCT: 45.7 % (ref 39.0–52.0)
Hemoglobin: 15.5 g/dL (ref 13.0–17.0)
MCHC: 33.9 g/dL (ref 30.0–36.0)
MCV: 95.6 fl (ref 78.0–100.0)
Platelets: 265 10*3/uL (ref 150.0–400.0)
RBC: 4.78 Mil/uL (ref 4.22–5.81)
RDW: 13.4 % (ref 11.5–15.5)
WBC: 10.6 10*3/uL — ABNORMAL HIGH (ref 4.0–10.5)

## 2021-03-23 LAB — T4, FREE: Free T4: 0.88 ng/dL (ref 0.60–1.60)

## 2021-03-23 NOTE — Assessment & Plan Note (Signed)
Seems to be stable from 3 years ago Will recheck labs---especially the B12

## 2021-03-23 NOTE — Assessment & Plan Note (Signed)
I have personally reviewed the Medicare Annual Wellness questionnaire and have noted 1. The patient's medical and social history 2. Their use of alcohol, tobacco or illicit drugs 3. Their current medications and supplements 4. The patient's functional ability including ADL's, fall risks, home safety risks and hearing or visual             impairment. 5. Diet and physical activities 6. Evidence for depression or mood disorders  The patients weight, height, BMI and visual acuity have been recorded in the chart I have made referrals, counseling and provided education to the patient based review of the above and I have provided the pt with a written personalized care plan for preventive services.  I have provided you with a copy of your personalized plan for preventive services. Please take the time to review along with your updated medication list.  Doesn't want to stop chewing tobacco Prefers no vaccines No cancer screening due to age Discussed some exercise

## 2021-03-23 NOTE — Patient Instructions (Signed)
Please try over the counter diclofenac (voltaren) gel for your knee.

## 2021-03-23 NOTE — Progress Notes (Signed)
Subjective:    Patient ID: Bryan Thomas, male    DOB: [birth year], 80 y.o.   MRN: [id number]  HPI Here for Medicare wellness visit and follow up of chronic health conditions This visit occurred during the SARS-CoV-2 public health emergency.  Safety protocols were in place, including screening questions prior to the visit, additional usage of staff PPE, and extensive cleaning of exam room while observing appropriate contact time as indicated for disinfecting solutions.   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   No current facility-administered medications on file prior to visit.    No Known Allergies  Past Medical History:  Diagnosis Date  . Generalized osteoarthritis of multiple sites     Past Surgical History:  Procedure Laterality Date  . KNEE ARTHROSCOPY Right 1990's  . LUMBAR DISC SURGERY  1970's    Family History  Problem Relation Age of Onset  . Cancer Mother   . Diabetes Sister   . Diabetes Brother   . Heart disease Brother        CABG  . Diabetes Brother   . Diabetes Brother     Social History   Socioeconomic History  . Marital status: Widowed    Spouse name: Not on file  . Number of children: 4  . Years of education: Not on file  . Highest education level: Not on file  Occupational History  . Occupation: TEFL teacher    Comment: tired  Tobacco Use  . Smoking status: Former Smoker     Quit date: 10/21/1997    Years since quitting: 23.4  . Smokeless tobacco: Current User    Types: Chew  Substance and Sexual Activity  . Alcohol use: No    Alcohol/week: 0.0 standard drinks  . Drug use: Not on file  . Sexual activity: Not on file  Other Topics Concern  . Not on file  Social History Narrative   Widowed 1/22      Has living will   Daughter Bryan Thomas is health care POA   Would accept resuscitation attempts--but no prolonged artificial ventilation   No tube feeds if cognitively unaware   Social Determinants of Health   Financial Resource Strain: Not on file  Food Insecurity: Not on file  Transportation Needs: Not on file  Physical Activity: Not on file  Stress: Not on file  Social Connections: Not on file  Intimate Partner Violence: Not on file   Review of Systems Appetite is good Weight up some Sleeps okay Not consistent with seat belt--discussed Keeps up with dentist No suspicious skin lesions No heartburn or dysphagia Bowels move fine--no blood No chest pain or SOB No dizziness or syncope No edema No other joint pains or back pain    Objective:   Physical Exam Constitutional:      Appearance: Normal appearance.  HENT:     Mouth/Throat:     Comments: No lesions Eyes:     Conjunctiva/sclera: Conjunctivae normal.     Pupils: Pupils  are equal, round, and reactive to light.  Cardiovascular:     Rate and Rhythm: Normal rate and regular rhythm.     Pulses: Normal pulses.     Heart sounds: No murmur heard. No gallop.      Comments: Some extra beats Pulmonary:     Effort: Pulmonary effort is normal.     Breath sounds: Normal breath sounds. No wheezing or rales.  Abdominal:     Palpations: Abdomen is soft.     Tenderness: There is no abdominal tenderness.  Musculoskeletal:     Cervical back: Neck supple.     Right lower leg: No edema.     Left lower leg: No edema.     Comments: Thickening in right knee without synovitis  Lymphadenopathy:      Cervical: No cervical adenopathy.  Skin:    General: Skin is warm.     Findings: No rash.  Neurological:     Mental Status: He is alert and oriented to person, place, and time.     Comments: President---"Biden, Trump, Bush---Obama" [id number] Recall 3/3  Psychiatric:        Mood and Affect: Mood normal.        Behavior: Behavior normal.            Assessment & Plan:

## 2021-03-23 NOTE — Assessment & Plan Note (Signed)
Mostly the right knee Recommended trying diclofenac gel

## 2021-03-23 NOTE — Assessment & Plan Note (Signed)
See social history 

## 2021-03-23 NOTE — Progress Notes (Signed)
Hearing Screening (Inadequate exam)   Method: Audiometry   125Hz  250Hz  500Hz  1000Hz  2000Hz  3000Hz  4000Hz  6000Hz  8000Hz   Right ear:   0 0 0  0    Left ear:   0 0 0  0      Visual Acuity Screening   Right eye Left eye Both eyes  Without correction: 20/50 20/30 20/30   With correction:

## 2021-03-26 LAB — COMPREHENSIVE METABOLIC PANEL
ALT: 20 U/L (ref 0–53)
AST: 21 U/L (ref 0–37)
Albumin: 4.2 g/dL (ref 3.5–5.2)
Alkaline Phosphatase: 104 U/L (ref 39–117)
BUN: 19 mg/dL (ref 6–23)
CO2: 25 mEq/L (ref 19–32)
Calcium: 9.2 mg/dL (ref 8.4–10.5)
Chloride: 102 mEq/L (ref 96–112)
Creatinine, Ser: 0.89 mg/dL (ref 0.40–1.50)
GFR: 80.13 mL/min (ref >60.00)
Glucose, Bld: 100 mg/dL — ABNORMAL HIGH (ref 70–99)
Potassium: 4.3 mEq/L (ref 3.5–5.1)
Sodium: 139 mEq/L (ref 135–145)
Total Bilirubin: 0.8 mg/dL (ref 0.2–1.2)
Total Protein: 7 g/dL (ref 6.0–8.3)

## 2021-07-31 ENCOUNTER — Other Ambulatory Visit: Payer: Self-pay

## 2021-07-31 ENCOUNTER — Telehealth: Payer: Self-pay

## 2021-07-31 ENCOUNTER — Ambulatory Visit (INDEPENDENT_AMBULATORY_CARE_PROVIDER_SITE_OTHER): Payer: PPO | Admitting: Physician Assistant

## 2021-07-31 ENCOUNTER — Encounter: Payer: Self-pay | Admitting: Physician Assistant

## 2021-07-31 VITALS — BP 130/72 | HR 72 | Temp 98.0°F | Ht 74.5 in | Wt 253.0 lb

## 2021-07-31 DIAGNOSIS — M170 Bilateral primary osteoarthritis of knee: Secondary | ICD-10-CM

## 2021-07-31 DIAGNOSIS — Z23 Encounter for immunization: Secondary | ICD-10-CM | POA: Diagnosis not present

## 2021-07-31 DIAGNOSIS — S61112A Laceration without foreign body of left thumb with damage to nail, initial encounter: Secondary | ICD-10-CM

## 2021-07-31 MED ORDER — CEFDINIR 300 MG PO CAPS: 300.0000 mg | ORAL_CAPSULE | Freq: Two times a day (BID) | ORAL | 0 refills | Status: DC

## 2021-07-31 NOTE — Progress Notes (Signed)
New Patient Office Visit  Subjective:  Patient ID: Bryan Thomas, male    DOB: 1941-05-14  Age: 80 y.o. MRN: [id number]  CC:  Chief Complaint  Patient presents with   thumb    HPI BASSEM BERNASCONI walked-in for laceration of left thumb with wood splitter. States it cut the top of his thumb, did not look for missing piece. Patient denies fever, chills, or lightheadedness. Our office contacted patient's PCP and he was out of the office this week and provider working in office had left for the day. Patient declined going to UC or ED for evaluation. Was added to the schedule.  Past Medical History:  Diagnosis Date   Generalized osteoarthritis of multiple sites     Past Surgical History:  Procedure Laterality Date   KNEE ARTHROSCOPY Right 74's   LUMBAR DISC SURGERY  1970's    Family History  Problem Relation Age of Onset   Cancer Mother    Diabetes Sister    Diabetes Brother    Heart disease Brother        CABG   Diabetes Brother    Diabetes Brother     Social History   Socioeconomic History   Marital status: Widowed    Spouse name: Not on file   Number of children: 4   Years of education: Not on file   Highest education level: Not on file  Occupational History   Occupation: Service work--mechanic    Comment: tired  Tobacco Use   Smoking status: Former   Smokeless tobacco: Current    Types: Chew  Substance and Sexual Activity   Alcohol use: No    Alcohol/week: 0.0 standard drinks   Drug use: Not on file   Sexual activity: Not on file  Other Topics Concern   Not on file  Social History Narrative   Widowed 1/22      Has living will   Daughter [name] is health care POA   Would accept resuscitation attempts--but no prolonged artificial ventilation   No tube feeds if cognitively unaware   Social Determinants of Health   Financial Resource Strain: Not on file  Food Insecurity: Not on file  Transportation Needs: Not on file  Physical Activity: Not on  file  Stress: Not on file  Social Connections: Not on file  Intimate Partner Violence: Not on file    ROS Review of Systems Review of Systems:  A fourteen system review of systems was performed and found to be positive as per HPI.  Objective:   Today's Vitals: BP 130/72   Pulse 72   Temp 98 F (36.7 C)   Ht 6' 2.5" (1.892 m)   Wt 253 lb (114.8 kg)   SpO2 92%   BMI 32.05 kg/m   Physical Exam General:  Well Developed, well nourished, appropriate for stated age.  Neuro:  Alert and oriented,  extra-ocular muscles intact  HEENT:  Normocephalic, atraumatic, neck supple Skin:  missing tip of left thumb and part of nail with fat tissue exposure, laceration with irregular edges, no foreign body noted MSK: Patient able to flex and extend left thumb, sensation intact to light touch Respiratory:  Not using accessory muscles, speaking in full sentences- unlabored. Vascular:  Ext warm, no cyanosis apprec.; radial pulse 2+ b/l  Psych:  No HI/SI, judgement and insight good, Euthymic mood. Full Affect.  Assessment & Plan:   Problem List Items Addressed This Visit   None Visit Diagnoses     Laceration  of left thumb without foreign body with damage to nail, initial encounter    -  Primary   Relevant Medications   cefdinir (OMNICEF) 300 MG capsule   Need for Tdap vaccination       Relevant Orders   Tdap vaccine greater than or equal to 7yo IM (Completed)      Laceration of left thumb without foreign body with damage to nail, initial encounter: -Extensive injury not suitable to suture repair and patient continues to decline going to the ED for evaluation. Wound cleaned with normal saline and non-adhesive dressing with topical antibiotic applied. Administered tetanus vaccine. Patient declined orthopedic referral. Will start prophylactic antibiotic therapy and discussed with patient red flag signs and symptoms to monitor for and advised to seek immediate medical care. Recommend to follow up  with his PCP.      Outpatient Encounter Medications as of 07/31/2021  Medication Sig   cefdinir (OMNICEF) 300 MG capsule Take 1 capsule (300 mg total) by mouth 2 (two) times daily.   No facility-administered encounter medications on file as of 07/31/2021.    Follow-up: Return if symptoms worsen or fail to improve with PCP.    Lorrene Reid, PA-C

## 2021-07-31 NOTE — Telephone Encounter (Signed)
Athena with [facility] called and said that pt walked in to their facility (their office was near where pt was injured)with tip of thumb off due to wood splitter mashed end of thumb off; pt is refusing to go to ED or UC and Chesley Noon is requesting order to clean area on thumb and give pt a tetanus shot. Dr Silvio Pate is out of office and I advised that pt needed eval to ck for possible fx, possibility of infection and tetanus immunization. Pt is also lightheaded. Athena asked me to hold on while she cked to see if someone there could see pt; Chesley Noon came back to phone and pt will be worked in and seen at clinic at [facility]. Sending note to Dr Silvio Pate who is out of office, Dr Einar Pheasant who is in office and [facility] CMA.

## 2021-07-31 NOTE — Telephone Encounter (Signed)
Appreciate [facility] seeing patient.

## 2021-07-31 NOTE — Telephone Encounter (Signed)
Noted I don't see a note----see if he needs a follow up

## 2021-07-31 NOTE — Patient Instructions (Signed)
Wound Care, Adult Taking care of your wound properly can help to prevent pain, infection, and scarring. It can also help your wound heal more quickly. Follow instructionsfrom your health care provider about how to care for your wound. Supplies needed: Soap and water. Wound cleanser. Gauze. If needed, a clean bandage (dressing) or other type of wound dressing material to cover or place in the wound. Follow your health care provider's instructions about what dressing supplies to use. Cream or ointment to apply to the wound, if told by your health care provider. How to care for your wound Cleaning the wound Ask your health care provider how to clean the wound. This may include: Using mild soap and water or a wound cleanser. Using a clean gauze to pat the wound dry after cleaning it. Do not rub or scrub the wound. Dressing care Wash your hands with soap and water for at least 20 seconds before and after you change the dressing. If soap and water are not available, use hand sanitizer. Change your dressing as told by your health care provider. This may include: Cleaning or rinsing out (irrigating) the wound. Placing a dressing over the wound or in the wound (packing). Covering the wound with an outer dressing. Leave any stitches (sutures), skin glue, or adhesive strips in place. These skin closures may need to stay in place for 2 weeks or longer. If adhesive strip edges start to loosen and curl up, you may trim the loose edges. Do not remove adhesive strips completely unless your health care provider tells you to do that. Ask your health care provider when you can leave the wound uncovered. Checking for infection Check your wound area every day for signs of infection. Check for: More redness, swelling, or pain. Fluid or blood. Warmth. Pus or a bad smell.  Follow these instructions at home Medicines If you were prescribed an antibiotic medicine, cream, or ointment, take or apply it as told by  your health care provider. Do not stop using the antibiotic even if your condition improves. If you were prescribed pain medicine, take it 30 minutes before you do any wound care or as told by your health care provider. Take over-the-counter and prescription medicines only as told by your health care provider. Eating and drinking Eat a diet that includes protein, vitamin A, vitamin C, and other nutrient-rich foods to help the wound heal. Foods rich in protein include meat, fish, eggs, dairy, beans, and nuts. Foods rich in vitamin A include carrots and dark green, leafy vegetables. Foods rich in vitamin C include citrus fruits, tomatoes, broccoli, and peppers. Drink enough fluid to keep your urine pale yellow. General instructions Do not take baths, swim, use a hot tub, or do anything that would put the wound underwater until your health care provider approves. Ask your health care provider if you may take showers. You may only be allowed to take sponge baths. Do not scratch or pick at the wound. Keep it covered as told by your health care provider. Return to your normal activities as told by your health care provider. Ask your health care provider what activities are safe for you. Protect your wound from the sun when you are outside for the first 6 months, or for as long as told by your health care provider. Cover up the scar area or apply sunscreen that has an SPF of at least 30. Do not use any products that contain nicotine or tobacco, such as cigarettes, e-cigarettes, and chewing tobacco.   These may delay wound healing. If you need help quitting, ask your health care provider. Keep all follow-up visits as told by your health care provider. This is important. Contact a health care provider if: You received a tetanus shot and you have swelling, severe pain, redness, or bleeding at the injection site. Your pain is not controlled with medicine. You have any of these signs of infection: More  redness, swelling, or pain around the wound. Fluid or blood coming from the wound. Warmth coming from the wound. Pus or a bad smell coming from the wound. A fever or chills. You are nauseous or you vomit. You are dizzy. Get help right away if: You have a red streak of skin near the area around your wound. Your wound has been closed with staples, sutures, skin glue, or adhesive strips and it begins to open up and separate. Your wound is bleeding, and the bleeding does not stop with gentle pressure. You have a rash. You faint. You have trouble breathing. These symptoms may represent a serious problem that is an emergency. Do not wait to see if the symptoms will go away. Get medical help right away. Call your local emergency services (911 in the [place] Do not drive yourself to the hospital. Summary Always wash your hands with soap and water for at least 20 seconds before and after changing your dressing. Change your dressing as told by your health care provider. To help with healing, eat foods that are rich in protein, vitamin A, vitamin C, and other nutrients. Check your wound every day for signs of infection. Contact your health care provider if you suspect that your wound is infected. This information is not intended to replace advice given to you by your health care provider. Make sure you discuss any questions you have with your healthcare provider. Document Revised: 07/23/2019 Document Reviewed: 07/23/2019 Elsevier Patient Education  2022 Elsevier Inc.  

## 2021-08-01 NOTE — Telephone Encounter (Signed)
Spoke to pt. He said he does not think he needs to be seen for his thumb right now. But, he would like to move forward with doing something for his knees. He said he has a hard time walking and getting up. I told him I remembered him having x-rays done a few years back and they said his knees were down to nothing. I told him he would probable be looking at replacements. He is asking does he need to start with [place] or be referred to an orthopedic dr to decide the best plan. [place] would be best.

## 2021-08-01 NOTE — Telephone Encounter (Addendum)
Spoke to Bryan Thomas. He said he wants someone good. Will agree to go to whoever Dr Silvio Pate recommends. He is aware someone will contact in the next 1-2 weeks with the referral.

## 2021-08-02 NOTE — Addendum Note (Signed)
Addended by: Viviana Simpler I on: 08/02/2021 04:12 PM   Modules accepted: Orders

## 2021-08-07 ENCOUNTER — Encounter: Payer: Self-pay | Admitting: Orthopaedic Surgery

## 2021-08-07 ENCOUNTER — Other Ambulatory Visit: Payer: Self-pay

## 2021-08-07 ENCOUNTER — Ambulatory Visit: Payer: PPO | Admitting: Orthopaedic Surgery

## 2021-08-07 ENCOUNTER — Ambulatory Visit: Payer: Self-pay

## 2021-08-07 DIAGNOSIS — M1711 Unilateral primary osteoarthritis, right knee: Secondary | ICD-10-CM | POA: Diagnosis not present

## 2021-08-07 DIAGNOSIS — M1712 Unilateral primary osteoarthritis, left knee: Secondary | ICD-10-CM | POA: Diagnosis not present

## 2021-08-07 NOTE — Progress Notes (Signed)
Office Visit Note   Patient: Bryan Thomas           Date of Birth: April 18, 1941           MRN: [id number] Visit Date: 08/07/2021              Requested by: Venia Carbon, MD [place],  [place] [id number] PCP: Venia Carbon, MD   Assessment & Plan: Visit Diagnoses:  1. Primary osteoarthritis of right knee   2. Primary osteoarthritis of left knee     Plan: I impression is end-stage bilateral knee DJD.  Fortunately he is no longer receiving relief from conservative management.  He is still very active and is very limited by his knees especially his right knee.  Therefore based on his options he has elected to proceed with a right total knee replacement.  He is aware of the risk benefits rehab recovery alternatives to surgery.  We will obtain the necessary preoperative medical clearance from PCP prior to scheduling the surgery.  Questions encouraged and answered.  Follow-Up Instructions: No follow-ups on file.   Orders:  Orders Placed This Encounter  Procedures   XR KNEE 3 VIEW RIGHT   XR KNEE 3 VIEW LEFT   No orders of the defined types were placed in this encounter.     Procedures: No procedures performed   Clinical Data: No additional findings.   Subjective: Chief Complaint  Patient presents with   Left Knee - Pain   Right Knee - Pain    Syrus is a very pleasant and active 80 year old gentleman with chronic severe bilateral knee pain that is very functionally limiting.  He finds that it is very difficult for him to walk due to the pain.  He has had cortisone injections in the past without significant relief.  He has undergone physician directed home exercise program without relief.  He takes over the counter medications as directed by his PCP.  Denies any numbness and tingling.   Review of Systems  Constitutional: Negative.   All other systems reviewed and are negative.   Objective: Vital Signs: There were no vitals taken for this  visit.  Physical Exam Vitals and nursing note reviewed.  Constitutional:      Appearance: He is well-developed.  Pulmonary:     Effort: Pulmonary effort is normal.  Abdominal:     Palpations: Abdomen is soft.  Skin:    General: Skin is warm.  Neurological:     Mental Status: He is alert and oriented to person, place, and time.  Psychiatric:        Behavior: Behavior normal.        Thought Content: Thought content normal.        Judgment: Judgment normal.    Ortho Exam  Right knee exam shows varus deformity.  2+ crepitus with range of motion.  Severe pain with flexion of the knee past 90 degrees.  No varus or valgus instability.  Left knee shows varus deformity.  2+ crepitus with range of motion.  Moderately severe pain with flexion of the knee past 90 degrees.  No varus or valgus instability.  Specialty Comments:  No specialty comments available.  Imaging: XR KNEE 3 VIEW LEFT  Result Date: 08/07/2021 See dictation for right knee x-ray.  XR KNEE 3 VIEW RIGHT  Result Date: 08/07/2021 Severe degenerative changes with varus deformity.  Large osteophytosis.  Bone-on-bone joint space narrowing.    PMFS History: Patient Active  Problem List   Diagnosis Date Noted   Primary osteoarthritis of left knee 08/07/2021   Primary osteoarthritis of right knee 08/07/2021   Sensory loss 02/13/2016   Preventative health care 02/13/2016   Advance directive discussed with patient 02/13/2016   Generalized osteoarthritis of multiple sites    Past Medical History:  Diagnosis Date   Generalized osteoarthritis of multiple sites     Family History  Problem Relation Age of Onset   Cancer Mother    Diabetes Sister    Diabetes Brother    Heart disease Brother        CABG   Diabetes Brother    Diabetes Brother     Past Surgical History:  Procedure Laterality Date   KNEE ARTHROSCOPY Right 1990's   LUMBAR [place] SURGERY  1970's   Social History   Occupational History   Occupation:  TEFL teacher    Comment: tired  Tobacco Use   Smoking status: Former   Smokeless tobacco: Current    Types: Chew  Substance and Sexual Activity   Alcohol use: No    Alcohol/week: 0.0 standard drinks   Drug use: Not on file   Sexual activity: Not on file

## 2021-08-08 ENCOUNTER — Other Ambulatory Visit: Payer: Self-pay

## 2021-09-10 ENCOUNTER — Ambulatory Visit (INDEPENDENT_AMBULATORY_CARE_PROVIDER_SITE_OTHER): Payer: PPO

## 2021-09-10 ENCOUNTER — Ambulatory Visit (INDEPENDENT_AMBULATORY_CARE_PROVIDER_SITE_OTHER): Payer: PPO | Admitting: Internal Medicine

## 2021-09-10 ENCOUNTER — Other Ambulatory Visit: Payer: Self-pay

## 2021-09-10 ENCOUNTER — Encounter: Payer: Self-pay | Admitting: Internal Medicine

## 2021-09-10 VITALS — BP 124/84 | HR 60 | Temp 97.7°F | Ht 75.0 in | Wt 248.0 lb

## 2021-09-10 DIAGNOSIS — R051 Acute cough: Secondary | ICD-10-CM

## 2021-09-10 DIAGNOSIS — Z01818 Encounter for other preprocedural examination: Secondary | ICD-10-CM | POA: Diagnosis not present

## 2021-09-10 DIAGNOSIS — R059 Cough, unspecified: Secondary | ICD-10-CM | POA: Diagnosis not present

## 2021-09-10 NOTE — Assessment & Plan Note (Addendum)
No clear infection and not sick Not sure if it could be related to giving up chewing tobacco Will check CXR--has increased interstitial markings but no clear infiltrate Will await the official overread before doing the clearance  CXR confirmed normal by radiologist His cough doesn't seem to be infectious and shouldn't limit him having the surgery He is medically cleared

## 2021-09-10 NOTE — Assessment & Plan Note (Addendum)
Has been healthy No cardiopulmonary conditions and no medications EKG shows sinus bradycardia at 58. Some sinus arrhythmia/PACs. NSIVCD.No ischemic changes or hypertrophy.  No sig change since 2009.  Only concern is cough--but isn't sick and no SOB

## 2021-09-10 NOTE — Progress Notes (Signed)
Subjective:    Patient ID: Bryan Thomas, male    DOB: [birth year], 80 y.o.   MRN: [id number]  HPI Here for medical clearance for planned right TKR This visit occurred during the SARS-CoV-2 public health emergency.  Safety protocols were in place, including screening questions prior to the visit, additional usage of staff PPE, and extensive cleaning of exam room while observing appropriate contact time as indicated for disinfecting solutions.   Scheduled for right TKR in 1 week  He feels okay No chest pain No SOB No dizziness or syncope No edema  Some cough for a couple of weeks Not sick No fever Cough drops help/ and mucinex Gave up chewing tobacco one month ago  No current outpatient medications on file prior to visit.   No current facility-administered medications on file prior to visit.    No Known Allergies  Past Medical History:  Diagnosis Date   Generalized osteoarthritis of multiple sites     Past Surgical History:  Procedure Laterality Date   KNEE ARTHROSCOPY Right 25's   LUMBAR DISC SURGERY  1970's    Family History  Problem Relation Age of Onset   Cancer Mother    Diabetes Sister    Diabetes Brother    Heart disease Brother        CABG   Diabetes Brother    Diabetes Brother     Social History   Socioeconomic History   Marital status: Widowed    Spouse name: Not on file   Number of children: 4   Years of education: Not on file   Highest education level: Not on file  Occupational History   Occupation: Service work--mechanic    Comment: tired  Tobacco Use   Smoking status: Former   Smokeless tobacco: Former    Types: Chew    Quit date: 08/21/2021  Substance and Sexual Activity   Alcohol use: No    Alcohol/week: 0.0 standard drinks   Drug use: Not on file   Sexual activity: Not on file  Other Topics Concern   Not on file  Social History Narrative   Widowed 1/22      Has living will   Daughter [name] is health care POA   Would  accept resuscitation attempts--but no prolonged artificial ventilation   No tube feeds if cognitively unaware   Social Determinants of Health   Financial Resource Strain: Not on file  Food Insecurity: Not on file  Transportation Needs: Not on file  Physical Activity: Not on file  Stress: Not on file  Social Connections: Not on file  Intimate Partner Violence: Not on file   Review of Systems Appetite is okay Weight is stable Sleeping okay No heartburn or dysphagia     Objective:   Physical Exam Constitutional:      Appearance: Normal appearance.  Cardiovascular:     Rate and Rhythm: Normal rate and regular rhythm.     Heart sounds: No murmur heard.   No gallop.  Pulmonary:     Effort: Pulmonary effort is normal.     Breath sounds: Normal breath sounds. No wheezing or rales.  Abdominal:     Palpations: Abdomen is soft.     Tenderness: There is no abdominal tenderness.  Musculoskeletal:     Cervical back: Neck supple.     Right lower leg: No edema.     Left lower leg: No edema.  Lymphadenopathy:     Cervical: No cervical adenopathy.  Neurological:  Mental Status: He is alert.  Psychiatric:        Mood and Affect: Mood normal.        Behavior: Behavior normal.           Assessment & Plan:

## 2021-09-11 NOTE — Pre-Procedure Instructions (Signed)
Surgical Instructions    Your procedure is scheduled on Monday 09/17/21.   Report to [facility] Main Entrance "A" at 10:20 A.M., then check in with the Admitting office.  Call this number if you have problems the morning of surgery:  [phone]   If you have any questions prior to your surgery date call [phone]: Open Monday-Friday 8am-4pm    Remember:  Do not eat after midnight the night before your surgery  You may drink clear liquids until 09:20 A.M. the morning of your surgery.   Clear liquids allowed are: Water, Non-Citrus Juices (without pulp), Carbonated Beverages, Clear Tea, Black Coffee ONLY (NO MILK, CREAM OR POWDERED CREAMER of any kind), and Gatorade    Enhanced Recovery after Surgery for Orthopedics Enhanced Recovery after Surgery is a protocol used to improve the stress on your body and your recovery after surgery.  Patient Instructions  The day of surgery (if you do NOT have diabetes):  Drink ONE (1) Pre-Surgery Clear Ensure by 09:20 A.M. the morning of surgery   This drink was given to you during your hospital  pre-op appointment visit. Nothing else to drink after completing the  Pre-Surgery Clear Ensure.        If you have questions, please contact your surgeon's office.    Take these medicines the morning of surgery with A SIP OF WATER   NONE   As of today, STOP taking any Aspirin (unless otherwise instructed by your surgeon) Aleve, Naproxen, Ibuprofen, Motrin, Advil, Goody's, BC's, all herbal medications, fish oil, and all vitamins.     After your COVID test   You are not required to quarantine however you are required to wear a well-fitting mask when you are out and around people not in your household.  If your mask becomes wet or soiled, replace with a new one.  Wash your hands often with soap and water for 20 seconds or clean your hands with an alcohol-based hand sanitizer that contains at least 60% alcohol.  Do not share personal  items.  Notify your provider: if you are in close contact with someone who has COVID  or if you develop a fever of 100.4 or greater, sneezing, cough, sore throat, shortness of breath or body aches.             Do not wear jewelry or makeup Do not wear lotions, powders, perfumes/colognes, or deodorant. Do not shave 48 hours prior to surgery.  Men may shave face and neck. Do not bring valuables to the hospital. DO Not wear nail polish, gel polish, artificial nails, or any other type of covering on natural nails including finger and toenails. If patients have artificial nails, gel coating, etc. that need to be removed by a nail salon, please have this removed prior to surgery or surgery may need to be canceled/delayed if the surgeon/ anesthesia feels like the patient is unable to be adequately monitored.             [facility] is not responsible for any belongings or valuables.  Do NOT Smoke (Tobacco/Vaping)  24 hours prior to your procedure  If you use a CPAP at night, you may bring your mask for your overnight stay.   Contacts, glasses, hearing aids, dentures or partials may not be worn into surgery, please bring cases for these belongings   For patients admitted to the hospital, discharge time will be determined by your treatment team.   Patients discharged the day of surgery will not  be allowed to drive home, and someone needs to stay with them for 24 hours.  NO VISITORS WILL BE ALLOWED IN PRE-OP WHERE PATIENTS ARE PREPPED FOR SURGERY.  ONLY 1 SUPPORT PERSON MAY BE PRESENT IN THE WAITING ROOM WHILE YOU ARE IN SURGERY.  IF YOU ARE TO BE ADMITTED, ONCE YOU ARE IN YOUR ROOM YOU WILL BE ALLOWED TWO (2) VISITORS. 1 (ONE) VISITOR MAY STAY OVERNIGHT BUT MUST ARRIVE TO THE ROOM BY 8pm.  Minor children may have two parents present. Special consideration for safety and communication needs will be reviewed on a case by case basis.  Special instructions:    Oral Hygiene is also important to  reduce your risk of infection.  Remember - BRUSH YOUR TEETH THE MORNING OF SURGERY WITH YOUR REGULAR TOOTHPASTE   [facility]- Preparing For Surgery  Before surgery, you can play an important role. Because skin is not sterile, your skin needs to be as free of germs as possible. You can reduce the number of germs on your skin by washing with CHG (chlorahexidine gluconate) Soap before surgery.  CHG is an antiseptic cleaner which kills germs and bonds with the skin to continue killing germs even after washing.     Please do not use if you have an allergy to CHG or antibacterial soaps. If your skin becomes reddened/irritated stop using the CHG.  Do not shave (including legs and underarms) for at least 48 hours prior to first CHG shower. It is OK to shave your face.  Please follow these instructions carefully.     Shower the NIGHT BEFORE SURGERY and the MORNING OF SURGERY with CHG Soap.   If you chose to wash your hair, wash your hair first as usual with your normal shampoo. After you shampoo, rinse your hair and body thoroughly to remove the shampoo.  Then [employer] and genitals (private parts) with your normal soap and rinse thoroughly to remove soap.  After that Use CHG Soap as you would any other liquid soap. You can apply CHG directly to the skin and wash gently with a scrungie or a clean washcloth.   Apply the CHG Soap to your body ONLY FROM THE NECK DOWN.  Do not use on open wounds or open sores. Avoid contact with your eyes, ears, mouth and genitals (private parts). Wash Face and genitals (private parts)  with your normal soap.   Wash thoroughly, paying special attention to the area where your surgery will be performed.  Thoroughly rinse your body with warm water from the neck down.  DO NOT shower/wash with your normal soap after using and rinsing off the CHG Soap.  Pat yourself dry with a CLEAN TOWEL.  Wear CLEAN PAJAMAS to bed the night before surgery  Place CLEAN SHEETS on your  bed the night before your surgery  DO NOT SLEEP WITH PETS.   Day of Surgery:  Take a shower with CHG soap. Wear Clean/Comfortable clothing the morning of surgery Do not apply any deodorants/lotions.   Remember to brush your teeth WITH YOUR REGULAR TOOTHPASTE.   Please read over the following fact sheets that you were given.

## 2021-09-12 ENCOUNTER — Other Ambulatory Visit: Payer: Self-pay | Admitting: Physician Assistant

## 2021-09-12 ENCOUNTER — Telehealth: Payer: Self-pay | Admitting: Internal Medicine

## 2021-09-12 MED ORDER — ASPIRIN EC 81 MG PO TBEC: 81.0000 mg | DELAYED_RELEASE_TABLET | Freq: Two times a day (BID) | ORAL | 0 refills | Status: DC

## 2021-09-12 MED ORDER — METHOCARBAMOL 500 MG PO TABS: 500.0000 mg | ORAL_TABLET | Freq: Two times a day (BID) | ORAL | 2 refills | Status: DC | PRN

## 2021-09-12 MED ORDER — DOCUSATE SODIUM 100 MG PO CAPS: 100.0000 mg | ORAL_CAPSULE | Freq: Every day | ORAL | 2 refills | Status: DC | PRN

## 2021-09-12 MED ORDER — ONDANSETRON HCL 4 MG PO TABS: 4.0000 mg | ORAL_TABLET | Freq: Three times a day (TID) | ORAL | 0 refills | Status: DC | PRN

## 2021-09-12 MED ORDER — OXYCODONE-ACETAMINOPHEN 5-325 MG PO TABS: 1.0000 | ORAL_TABLET | Freq: Four times a day (QID) | ORAL | 0 refills | Status: DC | PRN

## 2021-09-12 NOTE — Progress Notes (Addendum)
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   History includes former smoker (recently quit 08/21/21), osteoarthritis, lumbar disc surgery (1970's).   He had preoperative evaluation by Venia Carbon, MD on 09/10/21. He wrote,  "No clear infection and not sick Not sure if it could be related to giving up chewing tobacco Will check CXR--has increased interstitial markings but no clear infiltrate Will await the official overread before doing the clearance   CXR confirmed normal by radiologist His cough doesn't seem to be infectious and shouldn't limit him having the surgery He is medically cleared".  09/10/21 EKG is not yet viewable in [facility], so I spoke with staff at Dr. Alla German office on 09/12/21 and requested the EKG copy be either uploaded into [facility] or faxed to PAT. As of 09/14/21, EKG tracing still not available for review. He does not carry CAD, DM or HTN history and denied chest pain and SOB at PAT RN visit. If felt indicated, then he could get a repeat tracing on the day of surgery--however, per Dr. Alla German description (see EKG), tracing felt stable since 2009.   09/14/21 presurgical COVID-19 test is in process. Anesthesia team to evaluate on the day of surgery.    VS: BP (!) 147/67   Pulse (!) 55   Temp 36.8 C (Oral)   Resp 17   Ht 6\' 2"  (1.88 m)   Wt 113 kg   SpO2 97%   BMI 32.00 kg/m    PROVIDERS: Venia Carbon, MD is PCP    LABS: Preoperative labs noted.   (all labs ordered are listed, but only abnormal results are displayed)  Labs Reviewed  COMPREHENSIVE METABOLIC PANEL - Abnormal; Notable for the following components:      Result Value   Glucose,  Bld 142 (*)    All other components within normal limits  SARS CORONAVIRUS 2 (TAT 6-24 HRS)  SURGICAL PCR SCREEN  CBC WITH DIFFERENTIAL/PLATELET  PROTIME-INR  APTT  URINALYSIS, ROUTINE W REFLEX MICROSCOPIC     IMAGES: CXR 09/10/21: FINDINGS: The heart size and mediastinal contours are within normal limits. Both lungs are clear. The visualized skeletal structures are unremarkable. IMPRESSION: No active cardiopulmonary disease.    EKG: 09/10/21: Requested from Dr. Alla German office, as not yet available for review in [facility]. Per his note, "EKG shows sinus bradycardia at 58. Some sinus arrhythmia/PACs. NSIVCD.No ischemic changes or hypertrophy.  No sig change since 2009."   CV: Cardiac cath 12/22/00: CONCLUSIONS: 1. 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 2. Preserved overall left ventricular function.   Past Medical History:  Diagnosis Date   Generalized osteoarthritis of multiple sites     Past Surgical History:  Procedure Laterality Date   KNEE ARTHROSCOPY Right 06/21/1989   LUMBAR [place] SURGERY  06/21/1969   TONSILLECTOMY     removed as a teenager    MEDICATIONS:  aspirin EC 81 MG tablet   docusate sodium (COLACE) 100 MG capsule   methocarbamol (ROBAXIN) 500 MG tablet   ondansetron (ZOFRAN) 4 MG tablet   oxyCODONE-acetaminophen (PERCOCET) 5-325 MG tablet  No current facility-administered medications for this encounter.  ASA is a post-operative order.   Myra Gianotti, PA-C Surgical Short Stay/Anesthesiology [facility] Phone [phone] [facility] Phone [phone] 09/14/2021 1:29 PM

## 2021-09-12 NOTE — Telephone Encounter (Signed)
[facility] PreOp called the office to inform Bryan Thomas she cannot see the most recent EKG on Epic.   Asked for it to be uploaded again or faxed to [phone]

## 2021-09-12 NOTE — Telephone Encounter (Signed)
The EKG was not done through Epic. The printed EKG was sent to [facility]. It may not be available yet. A phone number was not given for me to call them back to advise. The EKG was faxed to the surgeons office. They may be able to get it from them. Can you track down the number?

## 2021-09-14 ENCOUNTER — Other Ambulatory Visit: Payer: Self-pay

## 2021-09-14 ENCOUNTER — Encounter (HOSPITAL_COMMUNITY): Payer: Self-pay

## 2021-09-14 ENCOUNTER — Encounter (HOSPITAL_COMMUNITY)
Admission: RE | Admit: 2021-09-14 | Discharge: 2021-09-14 | Disposition: A | Discharge status: Home / Self Care | Payer: PPO | Source: Ambulatory Visit | Attending: Orthopaedic Surgery | Admitting: Orthopaedic Surgery

## 2021-09-14 VITALS — BP 147/67 | HR 55 | Temp 98.3°F | Resp 17 | Ht 74.0 in | Wt 249.2 lb

## 2021-09-14 DIAGNOSIS — Z01818 Encounter for other preprocedural examination: Secondary | ICD-10-CM

## 2021-09-14 DIAGNOSIS — M1711 Unilateral primary osteoarthritis, right knee: Secondary | ICD-10-CM | POA: Diagnosis not present

## 2021-09-14 DIAGNOSIS — R059 Cough, unspecified: Secondary | ICD-10-CM | POA: Diagnosis not present

## 2021-09-14 DIAGNOSIS — Z20822 Contact with and (suspected) exposure to covid-19: Secondary | ICD-10-CM | POA: Insufficient documentation

## 2021-09-14 DIAGNOSIS — I498 Other specified cardiac arrhythmias: Secondary | ICD-10-CM | POA: Diagnosis not present

## 2021-09-14 DIAGNOSIS — Z01812 Encounter for preprocedural laboratory examination: Secondary | ICD-10-CM | POA: Diagnosis not present

## 2021-09-14 DIAGNOSIS — Z87891 Personal history of nicotine dependence: Secondary | ICD-10-CM | POA: Diagnosis not present

## 2021-09-14 LAB — CBC WITH DIFFERENTIAL/PLATELET
Abs Immature Granulocytes: 0.03 10*3/uL (ref 0.00–0.07)
Basophils Absolute: 0 10*3/uL (ref 0.0–0.1)
Basophils Relative: 0 %
Eosinophils Absolute: 0.5 10*3/uL (ref 0.0–0.5)
Eosinophils Relative: 6 %
HCT: 46.9 % (ref 39.0–52.0)
Hemoglobin: 15.3 g/dL (ref 13.0–17.0)
Immature Granulocytes: 0 %
Lymphocytes Relative: 35 %
Lymphs Abs: 3.3 10*3/uL (ref 0.7–4.0)
MCH: 32.6 pg (ref 26.0–34.0)
MCHC: 32.6 g/dL (ref 30.0–36.0)
MCV: 99.8 fL (ref 80.0–100.0)
Monocytes Absolute: 0.7 10*3/uL (ref 0.1–1.0)
Monocytes Relative: 8 %
Neutro Abs: 4.8 10*3/uL (ref 1.7–7.7)
Neutrophils Relative %: 51 %
Platelets: 289 10*3/uL (ref 150–400)
RBC: 4.7 MIL/uL (ref 4.22–5.81)
RDW: 12.8 % (ref 11.5–15.5)
WBC: 9.4 10*3/uL (ref 4.0–10.5)
nRBC: 0 % (ref 0.0–0.2)

## 2021-09-14 LAB — COMPREHENSIVE METABOLIC PANEL
ALT: 22 U/L (ref 0–44)
AST: 22 U/L (ref 15–41)
Albumin: 3.6 g/dL (ref 3.5–5.0)
Alkaline Phosphatase: 102 U/L (ref 38–126)
Anion gap: 7 (ref 5–15)
BUN: 17 mg/dL (ref 8–23)
CO2: 25 mmol/L (ref 22–32)
Calcium: 8.9 mg/dL (ref 8.9–10.3)
Chloride: 105 mmol/L (ref 98–111)
Creatinine, Ser: 0.79 mg/dL (ref 0.61–1.24)
GFR, Estimated: >60 mL/min (ref >60)
Glucose, Bld: 142 mg/dL — ABNORMAL HIGH (ref 70–99)
Potassium: 4.3 mmol/L (ref 3.5–5.1)
Sodium: 137 mmol/L (ref 135–145)
Total Bilirubin: 0.8 mg/dL (ref 0.3–1.2)
Total Protein: 6.8 g/dL (ref 6.5–8.1)

## 2021-09-14 LAB — URINALYSIS, ROUTINE W REFLEX MICROSCOPIC
Bilirubin Urine: NEGATIVE
Glucose, UA: NEGATIVE mg/dL
Hgb urine dipstick: NEGATIVE
Ketones, ur: NEGATIVE mg/dL
Leukocytes,Ua: NEGATIVE
Nitrite: NEGATIVE
Protein, ur: NEGATIVE mg/dL
Specific Gravity, Urine: 1.024 (ref 1.005–1.030)
pH: 5 (ref 5.0–8.0)

## 2021-09-14 LAB — APTT: aPTT: 29 seconds (ref 24–36)

## 2021-09-14 LAB — PROTIME-INR
INR: 1 (ref 0.8–1.2)
Prothrombin Time: 13.3 seconds (ref 11.4–15.2)

## 2021-09-14 LAB — SURGICAL PCR SCREEN
MRSA, PCR: NEGATIVE
Staphylococcus aureus: NEGATIVE

## 2021-09-14 LAB — SARS CORONAVIRUS 2 (TAT 6-24 HRS): SARS Coronavirus 2: NEGATIVE

## 2021-09-14 MED ORDER — TRANEXAMIC ACID 1000 MG/10ML IV SOLN
2000.0000 mg | INTRAVENOUS | Status: DC
  Filled 2021-09-14: qty 20

## 2021-09-14 NOTE — Progress Notes (Signed)
PCP - Dr. Viviana Simpler Cardiologist - denies  PPM/ICD - denies   Chest x-ray - 09/10/21 EKG - 09/10/21 Stress Test - pt states he had one around [place], no abnormalities ECHO - pt states he had one in the 1990's and Dr. Michela Pitcher he only had "a 5% blockage and things looked great" Cardiac Cath - 12/22/2000  Sleep Study - denies  DM- denies  Blood Thinner Instructions: n/a Aspirin Instructions: n/a  ERAS Protcol - yes PRE-SURGERY Ensure given at PAT  COVID TEST- 09/14/21 at PAT   Anesthesia review: no  Patient denies shortness of breath, fever, cough and chest pain at PAT appointment   All instructions explained to the patient, with a verbal understanding of the material. Patient agrees to go over the instructions while at home for a better understanding. Patient also instructed to wear a mask in public after being tested for COVID-19. The opportunity to ask questions was provided.

## 2021-09-14 NOTE — Anesthesia Preprocedure Evaluation (Addendum)
Anesthesia Evaluation  Patient identified by MRN, date of birth, ID band Patient awake    Reviewed: Allergy & Precautions, H&P , NPO status , Patient's Chart, lab work & pertinent test results  Airway Mallampati: II  TM Distance: >3 FB Neck ROM: Full    Dental no notable dental hx.    Pulmonary neg pulmonary ROS, former smoker,    Pulmonary exam normal breath sounds clear to auscultation       Cardiovascular Exercise Tolerance: Good negative cardio ROS Normal cardiovascular exam Rhythm:Regular Rate:Normal     Neuro/Psych negative neurological ROS  negative psych ROS   GI/Hepatic negative GI ROS, Neg liver ROS,   Endo/Other  obesity  Renal/GU negative Renal ROS  negative genitourinary   Musculoskeletal  (+) Arthritis  (h/l lumbar disc surgery), Osteoarthritis,    Abdominal   Peds negative pediatric ROS (+)  Hematology negative hematology ROS (+)   Anesthesia Other Findings   Reproductive/Obstetrics negative OB ROS                          Anesthesia Physical Anesthesia Plan  ASA: 2  Anesthesia Plan: MAC, Spinal and Regional   Post-op Pain Management: Tylenol PO (pre-op)   Induction: Intravenous  PONV Risk Score and Plan: 1 and Propofol infusion, Treatment may vary due to age or medical condition and TIVA  Airway Management Planned: Natural Airway and Simple Face Mask  Additional Equipment: None  Intra-op Plan:   Post-operative Plan: Extubation in OR  Informed Consent: I have reviewed the patients History and Physical, chart, labs and discussed the procedure including the risks, benefits and alternatives for the proposed anesthesia with the patient or authorized representative who has indicated his/her understanding and acceptance.     Dental advisory given  Plan Discussed with: CRNA, Anesthesiologist and Surgeon  Anesthesia Plan Comments: (Adductor canal block. Spinal.  GETA as backup. Norton Blizzard, MD   )     Anesthesia Quick Evaluation

## 2021-09-16 ENCOUNTER — Telehealth: Payer: Self-pay | Admitting: *Deleted

## 2021-09-16 NOTE — Telephone Encounter (Signed)
Ortho bundle pre-op call completed. 

## 2021-09-16 NOTE — Care Plan (Signed)
RNCM spoke with patient prior to his Right total knee arthroplasty. He lives alone, but has a daughter, who he states will assist after discharge. He had his home CPM delivered and already has a RW and BSC/3in1. Anticipate HHPT will be needed. Made referral to [facility], but due to location and staffing, they could not assist. Referral made to [facility]. Referral pending. Reviewed post op care instructions. Will continue to follow for needs.

## 2021-09-17 ENCOUNTER — Ambulatory Visit (HOSPITAL_COMMUNITY): Payer: PPO | Admitting: Anesthesiology

## 2021-09-17 ENCOUNTER — Ambulatory Visit (HOSPITAL_COMMUNITY): Payer: PPO | Admitting: Vascular Surgery

## 2021-09-17 ENCOUNTER — Observation Stay (HOSPITAL_COMMUNITY): Payer: PPO

## 2021-09-17 ENCOUNTER — Encounter (HOSPITAL_COMMUNITY)
Admission: RE | Disposition: A | Discharge status: Home / Self Care | Payer: Self-pay | Source: Home / Self Care | Attending: Orthopaedic Surgery

## 2021-09-17 ENCOUNTER — Observation Stay (HOSPITAL_COMMUNITY)
Admission: RE | Admit: 2021-09-17 | Discharge: 2021-09-19 | Disposition: A | Discharge status: Home / Self Care | Payer: PPO | Attending: Orthopaedic Surgery | Admitting: Orthopaedic Surgery

## 2021-09-17 ENCOUNTER — Encounter (HOSPITAL_COMMUNITY): Payer: Self-pay | Admitting: Orthopaedic Surgery

## 2021-09-17 DIAGNOSIS — G8918 Other acute postprocedural pain: Secondary | ICD-10-CM | POA: Diagnosis not present

## 2021-09-17 DIAGNOSIS — M1711 Unilateral primary osteoarthritis, right knee: Secondary | ICD-10-CM | POA: Diagnosis not present

## 2021-09-17 DIAGNOSIS — Z87891 Personal history of nicotine dependence: Secondary | ICD-10-CM | POA: Diagnosis not present

## 2021-09-17 DIAGNOSIS — M7989 Other specified soft tissue disorders: Secondary | ICD-10-CM | POA: Diagnosis not present

## 2021-09-17 DIAGNOSIS — R52 Pain, unspecified: Secondary | ICD-10-CM

## 2021-09-17 DIAGNOSIS — Z96651 Presence of right artificial knee joint: Secondary | ICD-10-CM | POA: Diagnosis not present

## 2021-09-17 DIAGNOSIS — M21161 Varus deformity, not elsewhere classified, right knee: Secondary | ICD-10-CM | POA: Diagnosis not present

## 2021-09-17 HISTORY — PX: TOTAL KNEE ARTHROPLASTY: SHX125

## 2021-09-17 SURGERY — ARTHROPLASTY, KNEE, TOTAL
Anesthesia: Monitor Anesthesia Care | Site: Knee | Laterality: Right

## 2021-09-17 MED ORDER — ORAL CARE MOUTH RINSE: 15.0000 mL | Freq: Once | OROMUCOSAL | Status: AC

## 2021-09-17 MED ORDER — ONDANSETRON HCL 4 MG/2ML IJ SOLN: 4.0000 mg | Freq: Four times a day (QID) | INTRAMUSCULAR | Status: DC | PRN

## 2021-09-17 MED ORDER — DEXAMETHASONE SODIUM PHOSPHATE 10 MG/ML IJ SOLN
INTRAMUSCULAR | Status: DC | PRN
  Administered 2021-09-17: 4 mg via INTRAVENOUS

## 2021-09-17 MED ORDER — SODIUM CHLORIDE 0.9 % IV SOLN: INTRAVENOUS | Status: DC

## 2021-09-17 MED ORDER — METHOCARBAMOL 500 MG PO TABS
500.0000 mg | ORAL_TABLET | Freq: Four times a day (QID) | ORAL | Status: DC | PRN
  Administered 2021-09-17 – 2021-09-19 (×4): 500 mg via ORAL
  Filled 2021-09-17 (×3): qty 1

## 2021-09-17 MED ORDER — PROPOFOL 500 MG/50ML IV EMUL
INTRAVENOUS | Status: DC | PRN
  Administered 2021-09-17: 75 ug/kg/min via INTRAVENOUS

## 2021-09-17 MED ORDER — BUPIVACAINE-MELOXICAM ER 400-12 MG/14ML IJ SOLN
INTRAMUSCULAR | Status: AC
  Filled 2021-09-17: qty 1

## 2021-09-17 MED ORDER — PHENYLEPHRINE 40 MCG/ML (10ML) SYRINGE FOR IV PUSH (FOR BLOOD PRESSURE SUPPORT)
PREFILLED_SYRINGE | INTRAVENOUS | Status: AC
  Filled 2021-09-17: qty 10

## 2021-09-17 MED ORDER — FENTANYL CITRATE PF 50 MCG/ML IJ SOSY: 50.0000 ug | PREFILLED_SYRINGE | Freq: Once | INTRAMUSCULAR | Status: DC

## 2021-09-17 MED ORDER — HYDROMORPHONE HCL 1 MG/ML IJ SOLN
0.2500 mg | INTRAMUSCULAR | Status: DC | PRN
  Administered 2021-09-17: 15:00:00 0.5 mg via INTRAVENOUS

## 2021-09-17 MED ORDER — MIDAZOLAM HCL 2 MG/2ML IJ SOLN
INTRAMUSCULAR | Status: AC
  Filled 2021-09-17: qty 2

## 2021-09-17 MED ORDER — IRRISEPT - 450ML BOTTLE WITH 0.05% CHG IN STERILE WATER, USP 99.95% OPTIME
TOPICAL | Status: DC | PRN
  Administered 2021-09-17: 13:00:00 450 mL

## 2021-09-17 MED ORDER — METHOCARBAMOL 1000 MG/10ML IJ SOLN
500.0000 mg | Freq: Four times a day (QID) | INTRAVENOUS | Status: DC | PRN
  Filled 2021-09-17: qty 5

## 2021-09-17 MED ORDER — ACETAMINOPHEN 325 MG PO TABS
325.0000 mg | ORAL_TABLET | Freq: Four times a day (QID) | ORAL | Status: DC | PRN
  Administered 2021-09-18: 650 mg via ORAL
  Filled 2021-09-17: qty 2

## 2021-09-17 MED ORDER — FENTANYL CITRATE (PF) 250 MCG/5ML IJ SOLN
INTRAMUSCULAR | Status: AC
  Filled 2021-09-17: qty 5

## 2021-09-17 MED ORDER — CEFAZOLIN SODIUM-DEXTROSE 2-4 GM/100ML-% IV SOLN
2.0000 g | Freq: Four times a day (QID) | INTRAVENOUS | Status: AC
  Administered 2021-09-17 – 2021-09-18 (×2): 2 g via INTRAVENOUS
  Filled 2021-09-17 (×2): qty 100

## 2021-09-17 MED ORDER — ONDANSETRON HCL 4 MG/2ML IJ SOLN
INTRAMUSCULAR | Status: AC
  Filled 2021-09-17: qty 2

## 2021-09-17 MED ORDER — METHOCARBAMOL 500 MG PO TABS
ORAL_TABLET | ORAL | Status: AC
  Filled 2021-09-17: qty 1

## 2021-09-17 MED ORDER — SODIUM CHLORIDE 0.9 % IR SOLN
Status: DC | PRN
  Administered 2021-09-17: 1000 mL

## 2021-09-17 MED ORDER — OXYCODONE HCL ER 10 MG PO T12A
10.0000 mg | EXTENDED_RELEASE_TABLET | Freq: Two times a day (BID) | ORAL | Status: DC
  Administered 2021-09-17: 22:00:00 10 mg via ORAL
  Filled 2021-09-17: qty 1

## 2021-09-17 MED ORDER — 0.9 % SODIUM CHLORIDE (POUR BTL) OPTIME
TOPICAL | Status: DC | PRN
  Administered 2021-09-17: 13:00:00 1000 mL

## 2021-09-17 MED ORDER — PROPOFOL 10 MG/ML IV BOLUS
INTRAVENOUS | Status: DC | PRN
  Administered 2021-09-17: 20 mg via INTRAVENOUS
  Administered 2021-09-17: 200 mg via INTRAVENOUS

## 2021-09-17 MED ORDER — DEXAMETHASONE SODIUM PHOSPHATE 10 MG/ML IJ SOLN
10.0000 mg | Freq: Once | INTRAMUSCULAR | Status: AC
  Administered 2021-09-18: 10 mg via INTRAVENOUS
  Filled 2021-09-17: qty 1

## 2021-09-17 MED ORDER — BUPIVACAINE HCL (PF) 0.5 % IJ SOLN
INTRAMUSCULAR | Status: DC | PRN
  Administered 2021-09-17: 30 mL

## 2021-09-17 MED ORDER — POVIDONE-IODINE 10 % EX SWAB
2.0000 "application " | Freq: Once | CUTANEOUS | Status: AC
  Administered 2021-09-17: 2 via TOPICAL

## 2021-09-17 MED ORDER — CHLORHEXIDINE GLUCONATE 0.12 % MT SOLN
15.0000 mL | Freq: Once | OROMUCOSAL | Status: AC
  Administered 2021-09-17: 10:00:00 15 mL via OROMUCOSAL
  Filled 2021-09-17: qty 15

## 2021-09-17 MED ORDER — METOCLOPRAMIDE HCL 5 MG PO TABS: 5.0000 mg | ORAL_TABLET | Freq: Three times a day (TID) | ORAL | Status: DC | PRN

## 2021-09-17 MED ORDER — KETOROLAC TROMETHAMINE 15 MG/ML IJ SOLN
7.5000 mg | Freq: Four times a day (QID) | INTRAMUSCULAR | Status: AC
  Administered 2021-09-17 – 2021-09-18 (×3): 7.5 mg via INTRAVENOUS
  Filled 2021-09-17 (×3): qty 1

## 2021-09-17 MED ORDER — ACETAMINOPHEN 500 MG PO TABS
1000.0000 mg | ORAL_TABLET | Freq: Four times a day (QID) | ORAL | Status: AC
  Administered 2021-09-17 – 2021-09-18 (×4): 1000 mg via ORAL
  Filled 2021-09-17 (×4): qty 2

## 2021-09-17 MED ORDER — BUPIVACAINE-MELOXICAM ER 400-12 MG/14ML IJ SOLN
INTRAMUSCULAR | Status: DC | PRN
  Administered 2021-09-17: 400 mg

## 2021-09-17 MED ORDER — TRANEXAMIC ACID 1000 MG/10ML IV SOLN
INTRAVENOUS | Status: DC | PRN
  Administered 2021-09-17: 13:00:00 2000 mg via TOPICAL

## 2021-09-17 MED ORDER — OXYCODONE HCL 5 MG PO TABS
ORAL_TABLET | ORAL | Status: AC
  Filled 2021-09-17: qty 2

## 2021-09-17 MED ORDER — TRANEXAMIC ACID-NACL 1000-0.7 MG/100ML-% IV SOLN
1000.0000 mg | Freq: Once | INTRAVENOUS | Status: AC
  Administered 2021-09-17: 19:00:00 1000 mg via INTRAVENOUS
  Filled 2021-09-17: qty 100

## 2021-09-17 MED ORDER — CEFAZOLIN SODIUM-DEXTROSE 2-4 GM/100ML-% IV SOLN
2.0000 g | INTRAVENOUS | Status: AC
  Administered 2021-09-17: 12:00:00 2 g via INTRAVENOUS
  Filled 2021-09-17: qty 100

## 2021-09-17 MED ORDER — PHENYLEPHRINE 40 MCG/ML (10ML) SYRINGE FOR IV PUSH (FOR BLOOD PRESSURE SUPPORT)
PREFILLED_SYRINGE | INTRAVENOUS | Status: DC | PRN
  Administered 2021-09-17: 40 ug via INTRAVENOUS

## 2021-09-17 MED ORDER — VANCOMYCIN HCL 1000 MG IV SOLR
INTRAVENOUS | Status: DC | PRN
  Administered 2021-09-17: 1000 mg via TOPICAL

## 2021-09-17 MED ORDER — LACTATED RINGERS IV SOLN: INTRAVENOUS | Status: DC

## 2021-09-17 MED ORDER — HYDROMORPHONE HCL 1 MG/ML IJ SOLN: 0.5000 mg | INTRAMUSCULAR | Status: DC | PRN

## 2021-09-17 MED ORDER — ONDANSETRON HCL 4 MG/2ML IJ SOLN
INTRAMUSCULAR | Status: DC | PRN
  Administered 2021-09-17: 4 mg via INTRAVENOUS

## 2021-09-17 MED ORDER — OXYCODONE HCL 5 MG PO TABS
10.0000 mg | ORAL_TABLET | ORAL | Status: DC | PRN
  Administered 2021-09-17: 19:00:00 10 mg via ORAL
  Filled 2021-09-17: qty 2

## 2021-09-17 MED ORDER — MENTHOL 3 MG MT LOZG: 1.0000 | LOZENGE | OROMUCOSAL | Status: DC | PRN

## 2021-09-17 MED ORDER — METOCLOPRAMIDE HCL 5 MG/ML IJ SOLN: 5.0000 mg | Freq: Three times a day (TID) | INTRAMUSCULAR | Status: DC | PRN

## 2021-09-17 MED ORDER — TRANEXAMIC ACID-NACL 1000-0.7 MG/100ML-% IV SOLN
1000.0000 mg | INTRAVENOUS | Status: AC
  Administered 2021-09-17: 12:00:00 1000 mg via INTRAVENOUS
  Filled 2021-09-17: qty 100

## 2021-09-17 MED ORDER — VANCOMYCIN HCL 1000 MG IV SOLR
INTRAVENOUS | Status: AC
  Filled 2021-09-17: qty 20

## 2021-09-17 MED ORDER — OXYCODONE HCL 5 MG PO TABS
5.0000 mg | ORAL_TABLET | ORAL | Status: DC | PRN
  Administered 2021-09-17 – 2021-09-18 (×2): 10 mg via ORAL
  Filled 2021-09-17: qty 2

## 2021-09-17 MED ORDER — FENTANYL CITRATE (PF) 100 MCG/2ML IJ SOLN
INTRAMUSCULAR | Status: DC | PRN
  Administered 2021-09-17 (×3): 50 ug via INTRAVENOUS

## 2021-09-17 MED ORDER — HYDROMORPHONE HCL 1 MG/ML IJ SOLN
INTRAMUSCULAR | Status: AC
  Filled 2021-09-17: qty 1

## 2021-09-17 MED ORDER — ASPIRIN 81 MG PO CHEW
81.0000 mg | CHEWABLE_TABLET | Freq: Two times a day (BID) | ORAL | Status: DC
  Administered 2021-09-17 – 2021-09-19 (×4): 81 mg via ORAL
  Filled 2021-09-17 (×4): qty 1

## 2021-09-17 MED ORDER — ACETAMINOPHEN 500 MG PO TABS
1000.0000 mg | ORAL_TABLET | Freq: Once | ORAL | Status: AC
  Administered 2021-09-17: 10:00:00 1000 mg via ORAL
  Filled 2021-09-17: qty 2

## 2021-09-17 MED ORDER — DOCUSATE SODIUM 100 MG PO CAPS
100.0000 mg | ORAL_CAPSULE | Freq: Two times a day (BID) | ORAL | Status: DC
  Administered 2021-09-17 – 2021-09-19 (×4): 100 mg via ORAL
  Filled 2021-09-17 (×4): qty 1

## 2021-09-17 MED ORDER — FENTANYL CITRATE (PF) 100 MCG/2ML IJ SOLN
INTRAMUSCULAR | Status: AC
  Administered 2021-09-17: 11:00:00 100 ug
  Filled 2021-09-17: qty 2

## 2021-09-17 MED ORDER — ONDANSETRON HCL 4 MG PO TABS: 4.0000 mg | ORAL_TABLET | Freq: Four times a day (QID) | ORAL | Status: DC | PRN

## 2021-09-17 MED ORDER — DEXAMETHASONE SODIUM PHOSPHATE 10 MG/ML IJ SOLN
INTRAMUSCULAR | Status: AC
  Filled 2021-09-17: qty 1

## 2021-09-17 MED ORDER — PHENOL 1.4 % MT LIQD: 1.0000 | OROMUCOSAL | Status: DC | PRN

## 2021-09-17 SURGICAL SUPPLY — 86 items
ADH SKN CLS APL DERMABOND .7 (GAUZE/BANDAGES/DRESSINGS) ×1
ALCOHOL 70% 16 OZ (MISCELLANEOUS) ×2 IMPLANT
BAG COUNTER SPONGE SURGICOUNT (BAG) IMPLANT
BAG DECANTER FOR FLEXI CONT (MISCELLANEOUS) ×2 IMPLANT
BAG SPNG CNTER NS LX DISP (BAG)
BANDAGE ESMARK 6X9 LF (GAUZE/BANDAGES/DRESSINGS) IMPLANT
BLADE SAG 18X100X1.27 (BLADE) ×2 IMPLANT
BNDG CMPR 9X6 STRL LF SNTH (GAUZE/BANDAGES/DRESSINGS)
BNDG ESMARK 6X9 LF (GAUZE/BANDAGES/DRESSINGS)
BOWL SMART MIX CTS (DISPOSABLE) ×2 IMPLANT
BSPLAT TIB 5D H CMNT STM RT (Knees) ×1 IMPLANT
CEMENT BONE REFOBACIN R1X40 US (Cement) ×2 IMPLANT
CLSR STERI-STRIP ANTIMIC 1/2X4 (GAUZE/BANDAGES/DRESSINGS) ×2 IMPLANT
COMP FEM CMT STD PS 12 RT (Joint) ×2 IMPLANT
COMPONENT FEM CMT STD PS 12 RT (Joint) IMPLANT
COOLER ICEMAN CLASSIC (MISCELLANEOUS) ×2 IMPLANT
COVER SURGICAL LIGHT HANDLE (MISCELLANEOUS) ×2 IMPLANT
CUFF TOURN SGL QUICK 34 (TOURNIQUET CUFF) ×2
CUFF TOURN SGL QUICK 42 (TOURNIQUET CUFF) IMPLANT
CUFF TRNQT CYL 34X4.125X (TOURNIQUET CUFF) ×1 IMPLANT
DERMABOND ADVANCED (GAUZE/BANDAGES/DRESSINGS) ×1
DERMABOND ADVANCED .7 DNX12 (GAUZE/BANDAGES/DRESSINGS) ×1 IMPLANT
DRAPE EXTREMITY T 121X128X90 (DISPOSABLE) ×2 IMPLANT
DRAPE HALF SHEET 40X57 (DRAPES) ×2 IMPLANT
DRAPE INCISE IOBAN 66X45 STRL (DRAPES) ×2 IMPLANT
DRAPE ORTHO SPLIT 77X108 STRL (DRAPES) ×4
DRAPE POUCH INSTRU U-SHP 10X18 (DRAPES) ×2 IMPLANT
DRAPE SURG ORHT 6 SPLT 77X108 (DRAPES) ×2 IMPLANT
DRAPE U-SHAPE 47X51 STRL (DRAPES) ×4 IMPLANT
DRESSING AQUACEL AG SP 3.5X10 (GAUZE/BANDAGES/DRESSINGS) IMPLANT
DRSG AQUACEL AG ADV 3.5X10 (GAUZE/BANDAGES/DRESSINGS) ×1 IMPLANT
DRSG AQUACEL AG SP 3.5X10 (GAUZE/BANDAGES/DRESSINGS) ×2
DURAPREP 26ML APPLICATOR (WOUND CARE) ×6 IMPLANT
ELECT CAUTERY BLADE 6.4 (BLADE) ×2 IMPLANT
ELECT REM PT RETURN 9FT ADLT (ELECTROSURGICAL) ×2
ELECTRODE REM PT RTRN 9FT ADLT (ELECTROSURGICAL) ×1 IMPLANT
GLOVE SURG SYN 7.5  E (GLOVE) ×8
GLOVE SURG SYN 7.5 E (GLOVE) ×4 IMPLANT
GLOVE SURG SYN 7.5 PF PI (GLOVE) ×4 IMPLANT
GLOVE SURG UNDER LTX SZ7.5 (GLOVE) ×4 IMPLANT
GLOVE SURG UNDER POLY LF SZ7 (GLOVE) ×2 IMPLANT
GOWN STRL REIN XL XLG (GOWN DISPOSABLE) ×2 IMPLANT
GOWN STRL REUS W/ TWL LRG LVL3 (GOWN DISPOSABLE) ×1 IMPLANT
GOWN STRL REUS W/TWL LRG LVL3 (GOWN DISPOSABLE) ×2
HANDPIECE INTERPULSE COAX TIP (DISPOSABLE) ×2
HDLS TROCR DRIL PIN KNEE 75 (PIN) ×2
HOOD PEEL AWAY FLYTE STAYCOOL (MISCELLANEOUS) ×4 IMPLANT
IV NS 1000ML (IV SOLUTION) ×2
IV NS 1000ML BAXH (IV SOLUTION) IMPLANT
JET LAVAGE IRRISEPT WOUND (IRRIGATION / IRRIGATOR) ×2
KIT BASIN OR (CUSTOM PROCEDURE TRAY) ×2 IMPLANT
KIT TURNOVER KIT B (KITS) ×2 IMPLANT
LAVAGE JET IRRISEPT WOUND (IRRIGATION / IRRIGATOR) ×1 IMPLANT
LINER TIB ASF PS GH/12 14 RT (Liner) ×1 IMPLANT
MANIFOLD NEPTUNE II (INSTRUMENTS) ×2 IMPLANT
MARKER SKIN DUAL TIP RULER LAB (MISCELLANEOUS) ×4 IMPLANT
NDL SPNL 18GX3.5 QUINCKE PK (NEEDLE) ×1 IMPLANT
NEEDLE SPNL 18GX3.5 QUINCKE PK (NEEDLE) ×2 IMPLANT
NS IRRIG 1000ML POUR BTL (IV SOLUTION) ×2 IMPLANT
PACK TOTAL JOINT (CUSTOM PROCEDURE TRAY) ×2 IMPLANT
PAD ARMBOARD 7.5X6 YLW CONV (MISCELLANEOUS) ×4 IMPLANT
PAD COLD SHLDR WRAP-ON (PAD) ×2 IMPLANT
PIN DRILL HDLS TROCAR 75 4PK (PIN) IMPLANT
SAW OSC TIP CART 19.5X105X1.3 (SAW) ×2 IMPLANT
SCREW FEMALE HEX FIX 25X2.5 (ORTHOPEDIC DISPOSABLE SUPPLIES) ×1 IMPLANT
SET HNDPC FAN SPRY TIP SCT (DISPOSABLE) ×1 IMPLANT
SPONGE T-LAP 18X18 ~~LOC~~+RFID (SPONGE) ×2 IMPLANT
STAPLER VISISTAT 35W (STAPLE) IMPLANT
STEM POLY PAT PLY 38M KNEE (Knees) ×1 IMPLANT
STEM TIBIA 5 DEG SZ H R KNEE (Knees) IMPLANT
SUCTION FRAZIER HANDLE 10FR (MISCELLANEOUS) ×2
SUCTION TUBE FRAZIER 10FR DISP (MISCELLANEOUS) ×1 IMPLANT
SUT ETHILON 2 0 FS 18 (SUTURE) ×2 IMPLANT
SUT MNCRL AB 4-0 PS2 18 (SUTURE) IMPLANT
SUT VIC AB 0 CT1 27 (SUTURE) ×4
SUT VIC AB 0 CT1 27XBRD ANBCTR (SUTURE) ×2 IMPLANT
SUT VIC AB 1 CTX 27 (SUTURE) ×7 IMPLANT
SUT VIC AB 2-0 CT1 27 (SUTURE) ×8
SUT VIC AB 2-0 CT1 TAPERPNT 27 (SUTURE) ×4 IMPLANT
SYR 50ML LL SCALE MARK (SYRINGE) ×4 IMPLANT
TIBIA STEM 5 DEG SZ H R KNEE (Knees) ×2 IMPLANT
TOWEL GREEN STERILE (TOWEL DISPOSABLE) ×2 IMPLANT
TOWEL GREEN STERILE FF (TOWEL DISPOSABLE) ×2 IMPLANT
TRAY CATH 16FR W/PLASTIC CATH (SET/KITS/TRAYS/PACK) IMPLANT
UNDERPAD 30X36 HEAVY ABSORB (UNDERPADS AND DIAPERS) ×2 IMPLANT
YANKAUER SUCT BULB TIP NO VENT (SUCTIONS) ×4 IMPLANT

## 2021-09-17 NOTE — Discharge Instructions (Signed)

## 2021-09-17 NOTE — Transfer of Care (Signed)
Immediate Anesthesia Transfer of Care Note  Patient: Bryan Thomas  Procedure(s) Performed: RIGHT TOTAL KNEE ARTHROPLASTY (Right: Knee)  Patient Location: PACU  Anesthesia Type:GA combined with regional for post-op pain  Level of Consciousness: drowsy and patient cooperative  Airway & Oxygen Therapy: Patient Spontanous Breathing and Patient connected to nasal cannula oxygen  Post-op Assessment: Report given to RN, Post -op Vital signs reviewed and stable and Patient moving all extremities  Post vital signs: Reviewed and stable  Last Vitals:  Vitals Value Taken Time  BP 139/99 09/17/21 1437  Temp 36.6 C 09/17/21 1435  Pulse 61 09/17/21 1441  Resp 9 09/17/21 1441  SpO2 95 % 09/17/21 1441  Vitals shown include unvalidated device data.  Last Pain:  Vitals:   09/17/21 1059  TempSrc:   PainSc: 0-No pain         Complications: No notable events documented.

## 2021-09-17 NOTE — Anesthesia Procedure Notes (Signed)
Procedure Name: LMA Insertion Date/Time: 09/17/2021 12:18 PM Performed by: Moshe Salisbury, CRNA Pre-anesthesia Checklist: Patient identified, Emergency Drugs available, Suction available and Patient being monitored Patient Re-evaluated:Patient Re-evaluated prior to induction Oxygen Delivery Method: Circle System Utilized Preoxygenation: Pre-oxygenation with 100% oxygen Induction Type: IV induction Ventilation: Mask ventilation without difficulty LMA: LMA inserted LMA Size: 5.0 Number of attempts: 1 Airway Equipment and Method: Bite block Placement Confirmation: positive ETCO2 Tube secured with: Tape Dental Injury: Teeth and Oropharynx as per pre-operative assessment

## 2021-09-17 NOTE — H&P (Signed)
PREOPERATIVE H&P  Chief Complaint: RIGHT KNEE DEGENERATIVE JOINT DISEASE  HPI: Bryan Thomas is a 80 y.o. male who presents for surgical treatment of RIGHT KNEE DEGENERATIVE JOINT DISEASE.  He denies any changes in medical history.  Past Medical History:  Diagnosis Date   Generalized osteoarthritis of multiple sites    Past Surgical History:  Procedure Laterality Date   KNEE ARTHROSCOPY Right 06/21/1989   LUMBAR [place] SURGERY  06/21/1969   TONSILLECTOMY     removed as a teenager   Social History   Socioeconomic History   Marital status: Widowed    Spouse name: Not on file   Number of children: 4   Years of education: Not on file   Highest education level: Not on file  Occupational History   Occupation: Service work--mechanic    Comment: tired  Tobacco Use   Smoking status: Former   Smokeless tobacco: Former    Types: Chew    Quit date: 08/21/2021  Vaping Use   Vaping Use: Never used  Substance and Sexual Activity   Alcohol use: No    Alcohol/week: 0.0 standard drinks   Drug use: Never   Sexual activity: Not on file  Other Topics Concern   Not on file  Social History Narrative   Widowed 1/22      Has living will   Daughter [name] is health care POA   Would accept resuscitation attempts--but no prolonged artificial ventilation   No tube feeds if cognitively unaware   Social Determinants of Health   Financial Resource Strain: Not on file  Food Insecurity: Not on file  Transportation Needs: Not on file  Physical Activity: Not on file  Stress: Not on file  Social Connections: Not on file   Family History  Problem Relation Age of Onset   Cancer Mother    Diabetes Sister    Diabetes Brother    Heart disease Brother        CABG   Diabetes Brother    Diabetes Brother    No Known Allergies Prior to Admission medications   Medication Sig Start Date End Date Taking? Authorizing Provider  aspirin EC 81 MG tablet Take 1 tablet (81 mg total) by mouth 2  (two) times daily. To be taken after surgery 09/12/21   Aundra Dubin, PA-C  docusate sodium (COLACE) 100 MG capsule Take 1 capsule (100 mg total) by mouth daily as needed. 09/12/21 09/12/22  Aundra Dubin, PA-C  methocarbamol (ROBAXIN) 500 MG tablet Take 1 tablet (500 mg total) by mouth 2 (two) times daily as needed. To be taken after surgery 09/12/21   Aundra Dubin, PA-C  ondansetron (ZOFRAN) 4 MG tablet Take 1 tablet (4 mg total) by mouth every 8 (eight) hours as needed for nausea or vomiting. 09/12/21   Aundra Dubin, PA-C  oxyCODONE-acetaminophen (PERCOCET) 5-325 MG tablet Take 1-2 tablets by mouth every 6 (six) hours as needed. To be taken after surgery 09/12/21   Aundra Dubin, PA-C     Positive ROS: All other systems have been reviewed and were otherwise negative with the exception of those mentioned in the HPI and as above.  Physical Exam: General: Alert, no acute distress Cardiovascular: No pedal edema Respiratory: No cyanosis, no use of accessory musculature GI: abdomen soft Skin: No lesions in the area of chief complaint Neurologic: Sensation intact distally Psychiatric: Patient is competent for consent with normal mood and affect Lymphatic: no lymphedema  MUSCULOSKELETAL: exam stable  Assessment: RIGHT KNEE DEGENERATIVE JOINT DISEASE  Plan: Plan for Procedure(s): RIGHT TOTAL KNEE ARTHROPLASTY  The risks benefits and alternatives were discussed with the patient including but not limited to the risks of nonoperative treatment, versus surgical intervention including infection, bleeding, nerve injury,  blood clots, cardiopulmonary complications, morbidity, mortality, among others, and they were willing to proceed.   Preoperative templating of the joint replacement has been completed, documented, and submitted to the Operating Room personnel in order to optimize intra-operative equipment management.   Eduard Roux, MD 09/17/2021 11:02 AM

## 2021-09-17 NOTE — Op Note (Signed)
Total Knee Arthroplasty Procedure Note  Preoperative diagnosis: Right knee osteoarthritis, varus deformity  Postoperative diagnosis:same  Operative procedure: Right total knee arthroplasty. CPT [id number]  Surgeon: N. Eduard Roux, MD  Assist: Madalyn Rob, PA-C; necessary for the timely completion of procedure and due to complexity of procedure.  Anesthesia: general, regional, local  Tourniquet time: see anesthesia record  Implants used: Zimmer persona Femur: CR 12 Tibia: H Patella: 38 mm Polyethylene: 14 mm, MC  Indication: Bryan Thomas is a 80 y.o. year old male with a history of knee pain. Having failed conservative management, the patient elected to proceed with a total knee arthroplasty.  We have reviewed the risk and benefits of the surgery and they elected to proceed after voicing understanding.  Procedure:  After informed consent was obtained and understanding of the risk were voiced including but not limited to bleeding, infection, damage to surrounding structures including nerves and vessels, blood clots, leg length inequality and the failure to achieve desired results, the operative extremity was marked with verbal confirmation of the patient in the holding area.   The patient was then brought to the operating room and transported to the operating room table in the supine position.  A tourniquet was applied to the operative extremity around the upper thigh. The operative limb was then prepped and draped in the usual sterile fashion and preoperative antibiotics were administered.  A time out was performed prior to the start of surgery confirming the correct extremity, preoperative antibiotic administration, as well as team members, implants and instruments available for the case. Correct surgical site was also confirmed with preoperative radiographs. The limb was then elevated for exsanguination and the tourniquet was inflated. A midline incision was made and a  standard medial parapatellar approach was performed.  The infrapatellar fat pad was removed.  There was severe synovitis throughout the knee.  Suprapatellar synovium was removed to reveal the anterior distal femoral cortex.   There was widespread severe degenerative wear and osteophytes.  He had a 15 degree flexion contracture.  A medial peel was performed to release the capsule of the medial tibial plateau.  The patella was then everted and was prepared and sized to a 38 mm.  A cover was placed on the patella for protection from retractors.  The knee was then brought into full flexion of 90 degrees and we then turned our attention to the femur.  The cruciates remnants were sacrificed.  Start site was drilled in the femur and the intramedullary distal femoral cutting guide was placed, set at 3 degrees valgus, taking 10 mm of distal resection. The distal cut was made. Osteophytes were then removed.  Next, the proximal tibial cutting guide was placed with appropriate slope, varus/valgus alignment and depth of resection. The proximal tibial cut was made. Gap blocks were then used to assess the extension gap and alignment, and appropriate soft tissue releases were performed. Attention was turned back to the femur, which was sized using the sizing guide to a size 12. Appropriate rotation of the femoral component was determined using epicondylar axis, Whiteside's line, and assessing the flexion gap under ligament tension. The appropriate size 4-in-1 cutting block was placed and checked with an angel wing and cuts were made. Posterior femoral osteophytes and uncapped bone were then removed with the curved osteotome.   There were 2 large chondral bodies in the posterior joint capsule that were removed which improved flexion to 120 degrees.  The baker's cyst decompressed itself.  Trial  components were placed, and stability was checked in full extension, mid-flexion, and deep flexion. Proper tibial rotation was determined  and marked.  The patella tracked well without a lateral release.  The femoral lugs were then drilled. Trial components were then removed and tibial preparation performed.  The tibia was sized for a size H component.   The bony surfaces were irrigated with a pulse lavage and then dried. Bone cement was vacuum mixed on the back table, and the final components sized above were cemented into place.  Antibiotic irrigation was placed in the knee joint and soft tissues while the cement cured.  After cement had finished curing, excess cement was removed. The stability of the construct was re-evaluated throughout a range of motion and found to be acceptable. The trial liner was removed, the knee was copiously irrigated, and the knee was re-evaluated for any excess bone debris. The real polyethylene liner, 14 mm thick, was inserted and checked to ensure the locking mechanism had engaged appropriately. The tourniquet was deflated and hemostasis was achieved. The wound was irrigated with normal saline.  One gram of vancomycin powder was placed in the surgical bed.  Topical 0.25% bupivacaine and meloxicam was placed in the joint for postoperative pain.  Capsular closure was performed with a #1 vicryl, subcutaneous fat closed with a 0 vicryl suture, then subcutaneous tissue closed with interrupted 2.0 vicryl suture. The skin was then closed with a 2.0 nylon and dermabond. A sterile dressing was applied.  The patient was awakened in the operating room and taken to recovery in stable condition. All sponge, needle, and instrument counts were correct at the end of the case.  Tawanna Cooler was necessary for opening, closing, retracting, limb positioning and overall facilitation and completion of the surgery.  Position: supine  Complications: none.  Time Out: performed   Drains/Packing: none  Estimated blood loss: minimal  Returned to Recovery Room: in good condition.   Antibiotics: yes   Mechanical VTE (DVT)  Prophylaxis: sequential compression devices, TED thigh-high  Chemical VTE (DVT) Prophylaxis: aspirin  Fluid Replacement  Crystalloid: see anesthesia record Blood: none  FFP: none   Specimens Removed: 1 to pathology   Sponge and Instrument Count Correct? yes   PACU: portable radiograph - knee AP and Lateral   Plan/RTC: Return in 2 weeks for wound check.   Weight Bearing/Load Lower Extremity: full   Implant Name Type Inv. Item Serial No. Manufacturer Lot No. LRB No. Used Action  CEMENT BONE REFOBACIN R1X40 [place] - [id number] Cement CEMENT BONE REFOBACIN R1X40 [place]  ZIMMER RECON(ORTH,TRAU,BIO,SG) [id number] Right 1 Implanted  CEMENT BONE REFOBACIN R1X40 [place] - [id number] Cement CEMENT BONE REFOBACIN R1X40 [place]  ZIMMER RECON(ORTH,TRAU,BIO,SG) [id number] Right 1 Implanted  TIBIA STEM 5 DEG SZ H R KNEE - [id number] Knees TIBIA STEM 5 DEG SZ H R KNEE  ZIMMER RECON(ORTH,TRAU,BIO,SG) [id number] Right 1 Implanted  STEM POLY PAT PLY 75M KNEE - [id number] Knees STEM POLY PAT PLY 75M KNEE  ZIMMER RECON(ORTH,TRAU,BIO,SG) [id number] Right 1 Implanted  PERSONA Femur Cemented Cruciate Retaining Right Standard Size 12    ZIMMER KNEE [id number] Right 1 Implanted  PERSONA Vivacit-E Articular Surface 50mm    ZIMMER KNEE [id number] Right 1 Implanted    N. Eduard Roux, MD [facility] 2:00 PM

## 2021-09-17 NOTE — Evaluation (Addendum)
Physical Therapy Evaluation Patient Details Name: Bryan Thomas MRN: [id number] DOB: 1941/10/21 Today's Date: 09/17/2021  History of Present Illness  Patient is a 80 y/o male who presents s/p right TKA 09/17/21.  Clinical Impression  Patient presents with pain, lethargy, decreased sensation RLE, impaired mobility and post surgical deficits s/p above. Pt lives alone and reports being independent for ambulation/ADLs PTA. Today, mobility assessment limited due to dizziness with sitting/standing. Requires Mod A to stand from EOB and Min A to side step along side bed with use of RW. Pt with difficulty keeping eyes opened likely due to pain medication. Pt will have support of daughter at d/c. Education re: knee precautions, positioning, there ex etc. Will follow acutely to maximize independence and mobility as well as further assess mobility prior to return home.     Recommendations for follow up therapy are one component of a multi-disciplinary discharge planning process, led by the attending physician.  Recommendations may be updated based on patient status, additional functional criteria and insurance authorization.  Follow Up Recommendations Follow physician's recommendations for discharge plan and follow up therapies    Assistance Recommended at Discharge Frequent or constant Supervision/Assistance  Functional Status Assessment Patient has had a recent decline in their functional status and demonstrates the ability to make significant improvements in function in a reasonable and predictable amount of time.  Equipment Recommendations  None recommended by PT    Recommendations for Other Services       Precautions / Restrictions Precautions Precautions: Knee Precaution Booklet Issued: No Precaution Comments: Reviewed precautions and no pillow under knee Restrictions Weight Bearing Restrictions: Yes RLE Weight Bearing: Weight bearing as tolerated      Mobility  Bed Mobility Overal  bed mobility: Needs Assistance Bed Mobility: Supine to Sit     Supine to sit: Min guard;HOB elevated     General bed mobility comments: Min guard for safety, use of rail.    Transfers Overall transfer level: Needs assistance Equipment used: Rolling walker (2 wheels) Transfers: Sit to/from Stand Sit to Stand: Mod assist;From elevated surface           General transfer comment: Assist to power to standing with cues for hand placement/technique, difficulty bringing UEs from bed to RW. + dizziness.    Ambulation/Gait Ambulation/Gait assistance: Min assist   Assistive device: Rolling walker (2 wheels)       Pre-gait activities: Able to side step along side bed with Min A for RW management, dizziness present limiting further ambulation.    Stairs            Wheelchair Mobility    Modified Rankin (Stroke Patients Only)       Balance Overall balance assessment: Needs assistance Sitting-balance support: Feet supported;No upper extremity supported Sitting balance-Leahy Scale: Good     Standing balance support: During functional activity;Reliant on assistive device for balance Standing balance-Leahy Scale: Poor Standing balance comment: Close Min guard due to dizziness.                             Pertinent Vitals/Pain Pain Assessment: Faces Faces Pain Scale: Hurts little more Pain Location: right knee Pain Descriptors / Indicators: Operative site guarding;Sore;Grimacing Pain Intervention(s): Monitored during session;Premedicated before session;Limited activity within patient's tolerance    Home Living Family/patient expects to be discharged to:: Private residence Living Arrangements: Alone Available Help at Discharge: Family;Available 24 hours/day Type of Home: House Home Access: Level entry  Home Layout: Two level;Able to live on main level with bedroom/bathroom Home Equipment: Rolling Walker (2 wheels);BSC/3in1;Cane - single  point Additional Comments: DME already delivered    Prior Function Prior Level of Function : Independent/Modified Independent               ADLs Comments: Doing IADLs, works in his shop fixing things. Worked Control and instrumentation engineer for years at [employer]     Hand Dominance        Extremity/Trunk Assessment   Upper Extremity Assessment Upper Extremity Assessment: Defer to OT evaluation    Lower Extremity Assessment Lower Extremity Assessment: RLE deficits/detail RLE Deficits / Details: post surgical deficits, limited knee flexion AROM, able to perform QS RLE Sensation: decreased light touch (distal to knee) RLE Coordination: decreased fine motor;decreased gross motor    Cervical / Trunk Assessment Cervical / Trunk Assessment: Normal  Communication   Communication: HOH  Cognition Arousal/Alertness: Lethargic;Suspect due to medications Behavior During Therapy: [facility] for tasks assessed/performed Overall Cognitive Status: Difficult to assess                                 General Comments: Requires repetition to follow commands due to [facility] ? Jokes appropriately, which is baseline per daughter. [facility] but needs further assessment.        General Comments General comments (skin integrity, edema, etc.): Daughter present during session. Some mild weeping from bandage noted.    Exercises Total Joint Exercises Ankle Circles/Pumps: AROM;Both;10 reps;Supine Quad Sets: AROM;Both;10 reps;Supine   Assessment/Plan    PT Assessment Patient needs continued PT services  PT Problem List Decreased range of motion;Decreased strength;Decreased mobility;Decreased skin integrity;Pain;Impaired sensation;Decreased balance;Decreased knowledge of precautions       PT Treatment Interventions Therapeutic activities;Gait training;Stair training;Balance training;Functional mobility training;Patient/family education;Therapeutic exercise;DME instruction    PT Goals (Current goals can  be found in the Care Plan section)  Acute Rehab PT Goals Patient Stated Goal: to get some food PT Goal Formulation: With patient Time For Goal Achievement: 10/01/21 Potential to Achieve Goals: Good    Frequency 7X/week   Barriers to discharge        Co-evaluation               AM-PAC PT "6 Clicks" Mobility  Outcome Measure Help needed turning from your back to your side while in a flat bed without using bedrails?: A Little Help needed moving from lying on your back to sitting on the side of a flat bed without using bedrails?: A Little Help needed moving to and from a bed to a chair (including a wheelchair)?: A Lot Help needed standing up from a chair using your arms (e.g., wheelchair or bedside chair)?: A Lot Help needed to walk in hospital room?: A Little Help needed climbing 3-5 steps with a railing? : A Little 6 Click Score: 16    End of Session Equipment Utilized During Treatment: Gait belt Activity Tolerance: Treatment limited secondary to medical complications (Comment) (dizziness) Patient left: in bed;with call bell/phone within reach;with family/visitor present;with SCD's reapplied Nurse Communication: Mobility status;Other (comment) (dizziness) PT Visit Diagnosis: Pain;Difficulty in walking, not elsewhere classified (R26.2);Unsteadiness on feet (R26.81);Dizziness and giddiness (R42) Pain - Right/Left: Right Pain - part of body: Knee    Time: 8315-1761 PT Time Calculation (min) (ACUTE ONLY): 20 min   Charges:   PT Evaluation $PT Eval Moderate Complexity: 1 Mod  Zettie Cooley, DPT Acute Rehabilitation Services Pager [phone] Office [phone]     Lacie Draft 09/17/2021, 4:19 PM

## 2021-09-17 NOTE — Anesthesia Procedure Notes (Signed)
Anesthesia Regional Block: Adductor canal block   Pre-Anesthetic Checklist: , timeout performed,  Correct Patient, Correct Site, Correct Laterality,  Correct Procedure, Correct Position, site marked,  Risks and benefits discussed,  Surgical consent,  Pre-op evaluation,  At surgeon's request and post-op pain management  Laterality: Right  Prep: chloraprep       Needles:  Injection technique: Single-shot  Needle Type: Echogenic Stimulator Needle     Needle Length: 9cm  Needle Gauge: 20     Additional Needles:   Procedures:,,,, ultrasound used (permanent image in chart),,    Narrative:  Start time: 09/17/2021 10:45 AM End time: 09/17/2021 10:50 AM Injection made incrementally with aspirations every 5 mL.  Performed by: Personally  Anesthesiologist: Merlinda Frederick, MD  Additional Notes: Functioning IV was confirmed and monitors were applied.  Sterile prep and drape,hand hygiene and sterile gloves were used. Ultrasound guidance: relevant anatomy identified, needle position confirmed, local anesthetic spread visualized around nerve(s)., vascular puncture avoided. Negative aspiration and negative test dose prior to incremental administration of local anesthetic. The patient tolerated the procedure well.

## 2021-09-18 ENCOUNTER — Other Ambulatory Visit: Payer: Self-pay | Admitting: Physician Assistant

## 2021-09-18 ENCOUNTER — Encounter (HOSPITAL_COMMUNITY): Payer: Self-pay | Admitting: Orthopaedic Surgery

## 2021-09-18 DIAGNOSIS — M1711 Unilateral primary osteoarthritis, right knee: Secondary | ICD-10-CM | POA: Diagnosis not present

## 2021-09-18 LAB — CBC
HCT: 43.2 % (ref 39.0–52.0)
Hemoglobin: 14.2 g/dL (ref 13.0–17.0)
MCH: 31.8 pg (ref 26.0–34.0)
MCHC: 32.9 g/dL (ref 30.0–36.0)
MCV: 96.9 fL (ref 80.0–100.0)
Platelets: 296 10*3/uL (ref 150–400)
RBC: 4.46 MIL/uL (ref 4.22–5.81)
RDW: 12.7 % (ref 11.5–15.5)
WBC: 15.2 10*3/uL — ABNORMAL HIGH (ref 4.0–10.5)
nRBC: 0 % (ref 0.0–0.2)

## 2021-09-18 LAB — BASIC METABOLIC PANEL
Anion gap: 7 (ref 5–15)
BUN: 15 mg/dL (ref 8–23)
CO2: 28 mmol/L (ref 22–32)
Calcium: 8.8 mg/dL — ABNORMAL LOW (ref 8.9–10.3)
Chloride: 101 mmol/L (ref 98–111)
Creatinine, Ser: 0.96 mg/dL (ref 0.61–1.24)
GFR, Estimated: >60 mL/min (ref >60)
Glucose, Bld: 141 mg/dL — ABNORMAL HIGH (ref 70–99)
Potassium: 4.4 mmol/L (ref 3.5–5.1)
Sodium: 136 mmol/L (ref 135–145)

## 2021-09-18 MED ORDER — TRAMADOL HCL 50 MG PO TABS
50.0000 mg | ORAL_TABLET | Freq: Three times a day (TID) | ORAL | 0 refills | Status: DC | PRN
Start: 2021-09-18 — End: 2021-09-27

## 2021-09-18 MED ORDER — HYDROXYZINE HCL 25 MG PO TABS
25.0000 mg | ORAL_TABLET | ORAL | Status: DC | PRN
  Administered 2021-09-18: 25 mg via ORAL

## 2021-09-18 MED ORDER — TRAMADOL HCL 50 MG PO TABS
50.0000 mg | ORAL_TABLET | Freq: Four times a day (QID) | ORAL | Status: DC | PRN
  Administered 2021-09-18 – 2021-09-19 (×2): 50 mg via ORAL
  Filled 2021-09-18 (×2): qty 1

## 2021-09-18 MED ORDER — TRAMADOL HCL 50 MG PO TABS: 50.0000 mg | ORAL_TABLET | Freq: Three times a day (TID) | ORAL | 0 refills | Status: DC | PRN

## 2021-09-18 NOTE — Progress Notes (Signed)
Subjective: 1 Day Post-Op Procedure(s) (LRB): RIGHT TOTAL KNEE ARTHROPLASTY (Right) Patient reports pain as mild.  C/o dizziness.  No cp/sob.  States he does not like taking meds and has not been taking anything for pain prior to sx.  Objective: Vital signs in last 24 hours: Temp:  [97.6 F (36.4 C)-98.1 F (36.7 C)] 97.6 F (36.4 C) (11/29 0808) Pulse Rate:  [37-74] 65 (11/29 0808) Resp:  [12-26] 18 (11/29 0808) BP: (124-169)/(69-99) 138/73 (11/29 0808) SpO2:  [93 %-98 %] 93 % (11/29 0808) Weight:  [113.4 kg] 113.4 kg (11/28 1014)  Intake/Output from previous day: 11/28 0701 - 11/29 0700 In: 1100 [P.O.:300; I.V.:500; IV Piggyback:300] Out: -  Intake/Output this shift: No intake/output data recorded.  Recent Labs    09/18/21 0338  HGB 14.2   Recent Labs    09/18/21 0338  WBC 15.2*  RBC 4.46  HCT 43.2  PLT 296   Recent Labs    09/18/21 0338  NA 136  K 4.4  CL 101  CO2 28  BUN 15  CREATININE 0.96  GLUCOSE 141*  CALCIUM 8.8*   No results for input(s): LABPT, INR in the last 72 hours.  Neurologically intact Neurovascular intact Sensation intact distally Intact pulses distally Dorsiflexion/Plantar flexion intact Incision: scant drainage No cellulitis present Compartment soft   Assessment/Plan: 1 Day Post-Op Procedure(s) (LRB): RIGHT TOTAL KNEE ARTHROPLASTY (Right) Advance diet Up with therapy Discharge home with home health after second PT session ONLY if dizziness resolves Dizziness likely from pain meds- I have d/c oxy and dilaudid and have added tramadol.     Anticipated LOS equal to or greater than 2 midnights due to - Age 109 and older with one or more of the following:  - Obesity  - Expected need for hospital services (PT, OT, Nursing) required for safe  discharge  - Anticipated need for postoperative skilled nursing care or inpatient rehab  - Active co-morbidities: None OR   - Unanticipated findings during/Post Surgery: None  - Patient  is a high risk of re-admission due to: None   Aundra Dubin 09/18/2021, 8:17 AM

## 2021-09-18 NOTE — Progress Notes (Signed)
Physical Therapy Treatment Patient Details Name: Bryan Thomas MRN: [id number] DOB: [birth year] Today's Date: 09/18/2021   History of Present Illness Patient is a 80 y/o male who presents s/p right TKA 09/17/21.    PT Comments    Pt progressing well from knee standpoint. Pt with excellent ROM and WBing tolerance however overall function remains limited by constant lightheadedness and feeling faint. Pt with freq stops during ambulation this session with eye blinking and becoming quiet. Pt with no syncopal episodes but constant lightheaded sensation. Suspect due to medication. Recommend another night stay in hopes for medicine to clear system and allow pt to become less of a falls risk. Daughter is concerned with taking pt home with lightheadedness as if he falls she can't assist him. Acute PT to cont to follow.    Recommendations for follow up therapy are one component of a multi-disciplinary discharge planning process, led by the attending physician.  Recommendations may be updated based on patient status, additional functional criteria and insurance authorization.  Follow Up Recommendations  Follow physician's recommendations for discharge plan and follow up therapies     Assistance Recommended at Discharge Frequent or constant Supervision/Assistance  Equipment Recommendations  None recommended by PT    Recommendations for Other Services       Precautions / Restrictions Precautions Precautions: Knee Precaution Booklet Issued: No Precaution Comments: Reviewed precautions and no pillow under knee Restrictions Weight Bearing Restrictions: Yes RLE Weight Bearing: Weight bearing as tolerated     Mobility  Bed Mobility Overal bed mobility: Needs Assistance Bed Mobility: Supine to Sit;Sit to Supine     Supine to sit: Modified independent (Device/Increase time) Sit to supine: Min guard   General bed mobility comments: pt pulled up on bed rail to bring self to eob, pt able to  bring LEs back into bed without assist    Transfers Overall transfer level: Needs assistance Equipment used: Rolling walker (2 wheels) Transfers: Sit to/from Stand Sit to Stand: Min guard           General transfer comment: verbal cues to push up from bed, pt quick to stand up requiring min guard for safety    Ambulation/Gait Ambulation/Gait assistance: Min assist Gait Distance (Feet): 200 Feet Assistive device: Rolling walker (2 wheels) Gait Pattern/deviations: Step-through pattern;Decreased stride length;Trunk flexed Gait velocity: impulsively quick Gait velocity interpretation: <1.31 ft/sec, indicative of household ambulator   General Gait Details: pt initially step to with walker far out infront of patient. with verbal and tactile cues pt able to stay in walker and achieve reciprocal gait pattern with continuos forward motion of RW. Pt with freq stops and eye blinking with and became quiet, when asked pt stated, just really lightheaded   Stairs             Wheelchair Mobility    Modified Rankin (Stroke Patients Only)       Balance Overall balance assessment: Needs assistance Sitting-balance support: Feet supported;No upper extremity supported Sitting balance-Leahy Scale: Good     Standing balance support: During functional activity;Reliant on assistive device for balance Standing balance-Leahy Scale: Fair Standing balance comment: dependent on RW for safe amb                            Cognition Arousal/Alertness: Awake/alert Behavior During Therapy: WFL for tasks assessed/performed Overall Cognitive Status: Difficult to assess  General Comments: pt very HOH        Exercises Total Joint Exercises Ankle Circles/Pumps: AROM;Both;10 reps;Seated Quad Sets: AROM;Both;10 reps;Supine (with heel elevated on towel roll) Heel Slides: AAROM;Right;10 reps;Supine (brining knee to chest) Long Arc Quad:  AROM;Right;10 reps;Seated (with 3 sec hold at top) Knee Flexion: AROM;Right;10 reps;Seated (with foot on a towel) Goniometric ROM: 85 deg actively in sitting, 92 deg passively    General Comments General comments (skin integrity, edema, etc.): incision with noted blood under dressing, RN made aware      Pertinent Vitals/Pain Pain Assessment: 0-10 Pain Score: 5  Pain Location: R knee Pain Descriptors / Indicators: Tightness Pain Intervention(s): Monitored during session    Home Living                          Prior Function            PT Goals (current goals can now be found in the care plan section) Acute Rehab PT Goals PT Goal Formulation: With patient Time For Goal Achievement: 10/01/21 Potential to Achieve Goals: Good Progress towards PT goals: Progressing toward goals    Frequency    7X/week      PT Plan Current plan remains appropriate    Co-evaluation              AM-PAC PT "6 Clicks" Mobility   Outcome Measure  Help needed turning from your back to your side while in a flat bed without using bedrails?: A Little Help needed moving from lying on your back to sitting on the side of a flat bed without using bedrails?: A Little Help needed moving to and from a bed to a chair (including a wheelchair)?: A Little Help needed standing up from a chair using your arms (e.g., wheelchair or bedside chair)?: A Little Help needed to walk in hospital room?: A Little Help needed climbing 3-5 steps with a railing? : A Little 6 Click Score: 18    End of Session Equipment Utilized During Treatment: Gait belt Activity Tolerance: Treatment limited secondary to medical complications (Comment) Patient left: with call bell/phone within reach;with family/visitor present;in bed Nurse Communication: Mobility status PT Visit Diagnosis: Pain;Difficulty in walking, not elsewhere classified (R26.2);Unsteadiness on feet (R26.81);Dizziness and giddiness (R42) Pain -  Right/Left: Right Pain - part of body: Knee     Time: 1250-1320 PT Time Calculation (min) (ACUTE ONLY): 30 min  Charges:  $Gait Training: 8-22 mins $Therapeutic Exercise: 8-22 mins                     Kittie Plater, PT, DPT Acute Rehabilitation Services Pager #: [phone] Office #: [phone]    Berline Lopes 09/18/2021, 2:04 PM

## 2021-09-18 NOTE — Anesthesia Postprocedure Evaluation (Signed)
Anesthesia Post Note  Patient: Bryan Thomas  Procedure(s) Performed: RIGHT TOTAL KNEE ARTHROPLASTY (Right: Knee)     Patient location during evaluation: PACU Anesthesia Type: Regional and General Level of consciousness: awake Pain management: pain level controlled Vital Signs Assessment: post-procedure vital signs reviewed and stable Respiratory status: spontaneous breathing and respiratory function stable Cardiovascular status: stable Postop Assessment: no apparent nausea or vomiting Anesthetic complications: no   No notable events documented.  Last Vitals:  Vitals:   09/18/21 1206 09/18/21 1612  BP: (!) 153/90 (!) 151/80  Pulse: 68 66  Resp: 18 18  Temp: 36.8 C 36.5 C  SpO2: 97% 97%    Last Pain:  Vitals:   09/18/21 1612  TempSrc: Oral  PainSc:    Pain Goal: Patients Stated Pain Goal: 3 (09/18/21 0719)                 Merlinda Frederick

## 2021-09-18 NOTE — Care Plan (Signed)
Ortho Bundle Case Management Note  Patient Details  Name: Bryan Thomas MRN: [id number] Date of Birth: 03-13-1941  [facility] spoke with patient prior to his Right total knee arthroplasty. He lives alone, but has a daughter, who he states will assist after discharge. He had his home CPM delivered and already has a RW and BSC/3in1. Anticipate HHPT will be needed. Made referral to [facility] and accepted. Reviewed post op care instructions. Will continue to follow for needs.                   DME Arranged:   (Home CPM received from Veteran; has RW and 3in1/BSC already in home) DME Agency:  [employer]  HH Arranged:  PT HH Agency:  [facility]  Additional Comments: Please contact me with any questions of if this plan should need to change.  Jamse Arn, RN, BSN, [employer]  [phone] 09/18/2021, 8:41 AM

## 2021-09-18 NOTE — Evaluation (Signed)
Occupational Therapy Evaluation Patient Details Name: Bryan Thomas MRN: [id number] DOB: [birth year] Today's Date: 09/18/2021   History of Present Illness Patient is a 80 y/o male who presents s/p right TKA 09/17/21.   Clinical Impression   Pt presented with daughter Bryan Thomas in room. Pt at this time limited by dizziness in session with out of bed activity but suspected due to medication. Pt was able to complete bed mobility with mod I, transfer to FW with min guard for hand placement cues, LB dressing without DME with min assist then post education on how to use set up to min guard. Pt currently with functional limitations due to the deficits listed below (see OT Problem List).  Pt will benefit from skilled OT to increase their safety and independence with ADL and functional mobility for ADL to facilitate discharge to venue listed below.        Recommendations for follow up therapy are one component of a multi-disciplinary discharge planning process, led by the attending physician.  Recommendations may be updated based on patient status, additional functional criteria and insurance authorization.   Follow Up Recommendations  Follow physician's recommendations for discharge plan and follow up therapies    Assistance Recommended at Discharge Frequent or constant Supervision/Assistance  Functional Status Assessment  Patient has had a recent decline in their functional status and demonstrates the ability to make significant improvements in function in a reasonable and predictable amount of time.  Equipment Recommendations   (long handle reacher and sponge)    Recommendations for Other Services       Precautions / Restrictions Precautions Precautions: Knee Precaution Booklet Issued: No Restrictions Weight Bearing Restrictions: Yes RLE Weight Bearing: Weight bearing as tolerated      Mobility Bed Mobility Overal bed mobility: Needs Assistance Bed Mobility: Supine to Sit      Supine to sit: Modified independent (Device/Increase time)          Transfers Overall transfer level: Needs assistance Equipment used: Rolling walker (2 wheels) Transfers: Sit to/from Stand Sit to Stand: Min guard           General transfer comment: Pt needs continued cues on positioning      Balance Overall balance assessment: Needs assistance Sitting-balance support: Feet supported;No upper extremity supported Sitting balance-Leahy Scale: Good     Standing balance support: During functional activity;Reliant on assistive device for balance Standing balance-Leahy Scale: Poor Standing balance comment: Close Min guard due to dizziness.                           ADL either performed or assessed with clinical judgement   ADL Overall ADL's : Needs assistance/impaired Eating/Feeding: Independent;Cueing for safety;Cueing for sequencing;Sitting   Grooming: Wash/dry hands;Wash/dry face;Min guard;Cueing for safety;Cueing for sequencing;Sitting   Upper Body Bathing: Set up;Cueing for safety;Cueing for sequencing;Sitting   Lower Body Bathing: Minimal assistance;Cueing for safety;Cueing for sequencing;Sit to/from stand   Upper Body Dressing : Set up;Sitting   Lower Body Dressing: Minimal assistance;Cueing for safety;Cueing for sequencing;Sit to/from stand   Toilet Transfer: Min guard;Cueing for safety;Cueing for sequencing;Ambulation;Rolling walker (2 wheels)   Toileting- Clothing Manipulation and Hygiene: Min guard;Cueing for safety;Cueing for sequencing;Sit to/from stand   Tub/ Shower Transfer: Minimal assistance;Cueing for safety;Cueing for sequencing;Tub bench   Functional mobility during ADLs: Min guard;Rolling walker (2 wheels) (limited ambulation due to dizziness)       Vision         Perception  Praxis      Pertinent Vitals/Pain Pain Assessment: 0-10 Pain Score: 4  Pain Location: right knee Pain Descriptors / Indicators: Operative site  guarding;Sore;Grimacing Pain Intervention(s): Limited activity within patient's tolerance;Monitored during session;Repositioned     Hand Dominance     Extremity/Trunk Assessment Upper Extremity Assessment Upper Extremity Assessment: Overall WFL for tasks assessed   Lower Extremity Assessment Lower Extremity Assessment: Defer to PT evaluation   Cervical / Trunk Assessment Cervical / Trunk Assessment: Normal   Communication Communication Communication: HOH   Cognition Arousal/Alertness: Lethargic;Suspect due to medications Behavior During Therapy: [facility] for tasks assessed/performed Overall Cognitive Status: Difficult to assess                                       General Comments       Exercises     Shoulder Instructions      Home Living Family/patient expects to be discharged to:: Private residence Living Arrangements: Alone Available Help at Discharge: Family;Available 24 hours/day Type of Home: House Home Access: Level entry     Home Layout: Two level;Able to live on main level with bedroom/bathroom         Bathroom Toilet: Standard     Home Equipment: Rolling Walker (2 wheels);BSC/3in1;Cane - single point;Tub bench   Additional Comments: possible sock aide      Prior Functioning/Environment Prior Level of Function : Independent/Modified Independent               ADLs Comments: Doing IADLs, works in his shop fixing things. Worked Control and instrumentation engineer for years at [employer]        OT Problem List: Decreased strength;Decreased activity tolerance;Impaired balance (sitting and/or standing);Decreased safety awareness;Decreased knowledge of use of DME or AE;Pain      OT Treatment/Interventions: Self-care/ADL training;Therapeutic exercise;DME and/or AE instruction;Patient/family education;Balance training    OT Goals(Current goals can be found in the care plan section) Acute Rehab OT Goals Patient Stated Goal: to go home soon OT Goal  Formulation: With patient Time For Goal Achievement: 09/29/21 Potential to Achieve Goals: Good ADL Goals Pt Will Perform Lower Body Bathing: with modified independence;sit to/from stand Pt Will Perform Lower Body Dressing: with modified independence;sit to/from stand Pt Will Transfer to Toilet: with modified independence;ambulating Pt Will Perform Tub/Shower Transfer: Tub transfer;with modified independence;ambulating;rolling walker  OT Frequency: Min 2X/week   Barriers to D/C:            Co-evaluation              AM-PAC OT "6 Clicks" Daily Activity     Outcome Measure Help from another person eating meals?: None Help from another person taking care of personal grooming?: None Help from another person toileting, which includes using toliet, bedpan, or urinal?: A Little Help from another person bathing (including washing, rinsing, drying)?: A Little Help from another person to put on and taking off regular upper body clothing?: None Help from another person to put on and taking off regular lower body clothing?: A Little 6 Click Score: 21   End of Session Equipment Utilized During Treatment: Gait belt;Rolling walker (2 wheels) Nurse Communication: Mobility status (dizziness)  Activity Tolerance:  (dizziness) Patient left: in chair;with call bell/phone within reach;with family/visitor present  OT Visit Diagnosis: Unsteadiness on feet (R26.81);Other abnormalities of gait and mobility (R26.89);Repeated falls (R29.6);Pain Pain - part of body:  (back)  Time: 0723-0802 OT Time Calculation (min): 39 min Charges:  OT General Charges $OT Visit: 1 Visit OT Evaluation $OT Eval Low Complexity: 1 Low OT Treatments $Self Care/Home Management : 23-37 mins  Joeseph Amor OTR/L  Acute Rehab Services  [phone] office number [phone] pager number   Joeseph Amor 09/18/2021, 8:16 AM

## 2021-09-18 NOTE — Progress Notes (Signed)
Physical Therapy Treatment Patient Details Name: Bryan Thomas MRN: [id number] DOB: November 30, 1940 Today's Date: 09/18/2021   History of Present Illness Patient is a 80 y/o male who presents s/p right TKA 09/17/21.    PT Comments    Pt continue to have c/o "light headedness" t/o session. BP 149/82. Suspect it could be from pain medicine. Spoke with Craig, [place] about this. Pt with good knee active and passively flexion and is walking well for age. Pt requires constant verbal cues for safe walker management and would need 24/7 assist for safe d/c home at this time. Acute PT to cont to follow.    Recommendations for follow up therapy are one component of a multi-disciplinary discharge planning process, led by the attending physician.  Recommendations may be updated based on patient status, additional functional criteria and insurance authorization.  Follow Up Recommendations  Follow physician's recommendations for discharge plan and follow up therapies     Assistance Recommended at Discharge Frequent or constant Supervision/Assistance  Equipment Recommendations  None recommended by PT    Recommendations for Other Services       Precautions / Restrictions Precautions Precautions: Knee Precaution Booklet Issued: No Restrictions Weight Bearing Restrictions: Yes RLE Weight Bearing: Weight bearing as tolerated     Mobility  Bed Mobility Overal bed mobility: Needs Assistance Bed Mobility: Supine to Sit     Supine to sit: Modified independent (Device/Increase time)     General bed mobility comments: pt received walking out of the bathroom    Transfers Overall transfer level: Needs assistance Equipment used: Rolling walker (2 wheels) Transfers: Sit to/from Stand Sit to Stand: Min guard           General transfer comment: verbal cues to push up from arm rests on chair, increased time, straightens R LE out when sitting    Ambulation/Gait Ambulation/Gait assistance: Min  assist Gait Distance (Feet): 120 Feet Assistive device: Rolling walker (2 wheels) Gait Pattern/deviations: Step-through pattern;Decreased stride length;Trunk flexed   Gait velocity interpretation: <1.31 ft/sec, indicative of household ambulator   General Gait Details: verbal cues to stay in walker and stand up right, pt with increased trunk flexion and tendency to push walker to far out in front. Pt with good reciprocal gait pattern and no L knee buckling. pt with noted DOE as well despite pt denying SOB, minA for walker mangaement during turns. Pt c/o "Im just lightheaded". BP 149/82   Stairs             Wheelchair Mobility    Modified Rankin (Stroke Patients Only)       Balance Overall balance assessment: Needs assistance Sitting-balance support: Feet supported;No upper extremity supported Sitting balance-Leahy Scale: Good     Standing balance support: During functional activity;Reliant on assistive device for balance Standing balance-Leahy Scale: Fair Standing balance comment: dependent on RW for safe amb                            Cognition Arousal/Alertness: Awake/alert Behavior During Therapy: WFL for tasks assessed/performed Overall Cognitive Status: Difficult to assess                                 General Comments: pt very HOH which could contribute to delayed comprehension and command following, per daughter pt is at baseline        Exercises Total Joint Exercises Ankle Circles/Pumps: AROM;Both;10  reps;Seated Long Arc Quad: AROM;Right;10 reps;Seated (with 3 sec hold at top) Knee Flexion: AROM;Right;10 reps;Seated (with foot on a towel) Goniometric ROM: 85 deg actively in sitting, 92 deg passively    General Comments General comments (skin integrity, edema, etc.): incision covered by dressing and ted hose      Pertinent Vitals/Pain Pain Assessment: 0-10 Pain Score: 2  Pain Location: R knee Pain Descriptors / Indicators:  Tightness Pain Intervention(s): Monitored during session    Home Living Family/patient expects to be discharged to:: Private residence Living Arrangements: Alone Available Help at Discharge: Family;Available 24 hours/day Type of Home: House Home Access: Level entry       Home Layout: Two level;Able to live on main level with bedroom/bathroom Home Equipment: Rolling Walker (2 wheels);BSC/3in1;Cane - single point;Tub bench Additional Comments: possible sock aide    Prior Function            PT Goals (current goals can now be found in the care plan section) Progress towards PT goals: Progressing toward goals    Frequency    7X/week      PT Plan Current plan remains appropriate    Co-evaluation              AM-PAC PT "6 Clicks" Mobility   Outcome Measure  Help needed turning from your back to your side while in a flat bed without using bedrails?: A Little Help needed moving from lying on your back to sitting on the side of a flat bed without using bedrails?: A Little Help needed moving to and from a bed to a chair (including a wheelchair)?: A Little Help needed standing up from a chair using your arms (e.g., wheelchair or bedside chair)?: A Little Help needed to walk in hospital room?: A Little Help needed climbing 3-5 steps with a railing? : A Little 6 Click Score: 18    End of Session Equipment Utilized During Treatment: Gait belt Activity Tolerance: Treatment limited secondary to medical complications (Comment) Patient left: in chair;with call bell/phone within reach;with family/visitor present Nurse Communication: Mobility status PT Visit Diagnosis: Pain;Difficulty in walking, not elsewhere classified (R26.2);Unsteadiness on feet (R26.81);Dizziness and giddiness (R42) Pain - Right/Left: Right Pain - part of body: Knee     Time: 8502-7741 PT Time Calculation (min) (ACUTE ONLY): 16 min  Charges:  $Gait Training: 8-22 mins                     Kittie Plater, PT, DPT Acute Rehabilitation Services Pager #: [phone] Office #: [phone]    Berline Lopes 09/18/2021, 11:46 AM

## 2021-09-18 NOTE — Discharge Summary (Addendum)
Patient ID: CONG HIGHTOWER MRN: [id number] DOB/AGE: [birth year] 80 y.o.  Admit date: 09/17/2021 Discharge date: 09/19/2021  Admission Diagnoses:  Principal Problem:   Primary osteoarthritis of right knee Active Problems:   Status post total right knee replacement   Discharge Diagnoses:  Same  Past Medical History:  Diagnosis Date   Generalized osteoarthritis of multiple sites     Surgeries: Procedure(s): RIGHT TOTAL KNEE ARTHROPLASTY on 09/17/2021   Consultants:   Discharged Condition: Improved  Hospital Course: ALDIN DREES is an 80 y.o. male who was admitted 09/17/2021 for operative treatment ofPrimary osteoarthritis of right knee. Patient has severe unremitting pain that affects sleep, daily activities, and work/hobbies. After pre-op clearance the patient was taken to the operating room on 09/17/2021 and underwent  Procedure(s): RIGHT TOTAL KNEE ARTHROPLASTY.    Patient was given perioperative antibiotics:  Anti-infectives (From admission, onward)    Start     Dose/Rate Route Frequency Ordered Stop   09/17/21 1800  ceFAZolin (ANCEF) IVPB 2g/100 mL premix        2 g 200 mL/hr over 30 Minutes Intravenous Every 6 hours 09/17/21 1525 09/18/21 0044   09/17/21 1247  vancomycin (VANCOCIN) powder  Status:  Discontinued          As needed 09/17/21 1247 09/17/21 1432   09/17/21 1015  ceFAZolin (ANCEF) IVPB 2g/100 mL premix        2 g 200 mL/hr over 30 Minutes Intravenous On call to O.R. 09/17/21 1007 09/17/21 1221        Patient was given sequential compression devices, early ambulation, and chemoprophylaxis to prevent DVT.  Patient benefited maximally from hospital stay and there were no complications.    Recent vital signs: Patient Vitals for the past 24 hrs:  BP Temp Temp src Pulse Resp SpO2  09/19/21 0757 139/74 98.8 F (37.1 C) Oral 79 18 97 %  09/19/21 0333 (!) 157/95 (!) 97.4 F (36.3 C) Oral 64 20 100 %  09/18/21 2314 (!) 144/72 97.7 F (36.5 C) Oral  64 20 97 %  09/18/21 1943 (!) 181/75 97.7 F (36.5 C) Oral 72 20 98 %  09/18/21 1612 (!) 151/80 97.7 F (36.5 C) Oral 66 18 97 %  09/18/21 1206 (!) 153/90 98.2 F (36.8 C) Oral 68 18 97 %  09/18/21 0808 138/73 97.6 F (36.4 C) Oral 65 18 93 %     Recent laboratory studies:  Recent Labs    09/18/21 0338  WBC 15.2*  HGB 14.2  HCT 43.2  PLT 296  NA 136  K 4.4  CL 101  CO2 28  BUN 15  CREATININE 0.96  GLUCOSE 141*  CALCIUM 8.8*     Discharge Medications:   Allergies as of 09/19/2021   No Known Allergies      Medication List     STOP taking these medications    oxyCODONE-acetaminophen 5-325 MG tablet Commonly known as: Percocet       TAKE these medications    aspirin EC 81 MG tablet Take 1 tablet (81 mg total) by mouth 2 (two) times daily. To be taken after surgery   docusate sodium 100 MG capsule Commonly known as: Colace Take 1 capsule (100 mg total) by mouth daily as needed.   methocarbamol 500 MG tablet Commonly known as: Robaxin Take 1 tablet (500 mg total) by mouth 2 (two) times daily as needed. To be taken after surgery   ondansetron 4 MG tablet Commonly known as: Zofran Take 1 tablet (  4 mg total) by mouth every 8 (eight) hours as needed for nausea or vomiting.   traMADol 50 MG tablet Commonly known as: ULTRAM Take 1-2 tablets (50-100 mg total) by mouth 3 (three) times daily as needed.               Durable Medical Equipment  (From admission, onward)           Start     Ordered   09/17/21 1539  DME Walker rolling  Once       Question Answer Comment  Walker: With 5 Inch Wheels   Patient needs a walker to treat with the following condition Status post left partial knee replacement      09/17/21 1538   09/17/21 1539  DME 3 n 1  Once        09/17/21 1538   09/17/21 1539  DME Bedside commode  Once       Question:  Patient needs a bedside commode to treat with the following condition  Answer:  Status post left partial knee  replacement   09/17/21 1538            Diagnostic Studies: DG Chest 2 View  Result Date: 09/11/2021 CLINICAL DATA:  Cough for several weeks EXAM: CHEST - 2 VIEW COMPARISON:  None. FINDINGS: The heart size and mediastinal contours are within normal limits. Both lungs are clear. The visualized skeletal structures are unremarkable. IMPRESSION: No active cardiopulmonary disease. Electronically Signed   By: Inez Catalina M.D.   On: 09/11/2021 00:15   DG Knee Right Port  Result Date: 09/17/2021 CLINICAL DATA:  Total knee arthroplasty EXAM: PORTABLE RIGHT KNEE - 1-2 VIEW COMPARISON:  None. FINDINGS: Post right total knee arthroplasty. Postoperative soft tissue swelling and air. No evidence of complication. IMPRESSION: Standard appearance post right knee arthroplasty. Electronically Signed   By: Macy Mis M.D.   On: 09/17/2021 15:26    Disposition: Discharge disposition: 01-Home or Self Care         Follow-up Information     Leandrew Koyanagi, MD. Go on 10/02/2021.   Specialty: Orthopedic Surgery Why: at 2:15 pm for your 2 week in office appointment with Dr. Sherilyn Cooter information: [place] [id number] [phone]                  Signed: Aundra Dubin 09/19/2021, 7:59 AM

## 2021-09-19 ENCOUNTER — Encounter (HOSPITAL_COMMUNITY): Payer: Self-pay | Admitting: Orthopaedic Surgery

## 2021-09-19 ENCOUNTER — Other Ambulatory Visit: Payer: Self-pay | Admitting: Physician Assistant

## 2021-09-19 DIAGNOSIS — M1711 Unilateral primary osteoarthritis, right knee: Secondary | ICD-10-CM | POA: Diagnosis not present

## 2021-09-19 DIAGNOSIS — Z96651 Presence of right artificial knee joint: Secondary | ICD-10-CM | POA: Diagnosis not present

## 2021-09-19 NOTE — Progress Notes (Signed)
Occupational Therapy Treatment Patient Details Name: Bryan Thomas MRN: [id number] DOB: [birth year] Today's Date: 09/19/2021   History of present illness Patient is a 80 y/o male who presents s/p right TKA 09/17/21.   OT comments  Patient received in bed and agreeable to OT session. Patient was able to get to EOB and addressed LB dressing with doffing and donning socks with verbal cues for safety. Patient declined washing up at sink and performed mobility with RW to address episodes of dizziness from yesterday's session. Patient was supervision to perform mobility tasks in hallway and room with no complaints of dizziness and states he has decreased pain. Acute OT to continue to follow.    Recommendations for follow up therapy are one component of a multi-disciplinary discharge planning process, led by the attending physician.  Recommendations may be updated based on patient status, additional functional criteria and insurance authorization.    Follow Up Recommendations  Follow physician's recommendations for discharge plan and follow up therapies    Assistance Recommended at Discharge Frequent or constant Supervision/Assistance  Equipment Recommendations       Recommendations for Other Services      Precautions / Restrictions Precautions Precautions: Knee Precaution Booklet Issued: No Restrictions Weight Bearing Restrictions: Yes RLE Weight Bearing: Weight bearing as tolerated       Mobility Bed Mobility Overal bed mobility: Needs Assistance Bed Mobility: Supine to Sit     Supine to sit: Modified independent (Device/Increase time)     General bed mobility comments: able to get to eob with initiation cue    Transfers Overall transfer level: Needs assistance Equipment used: Rolling walker (2 wheels) Transfers: Sit to/from Stand Sit to Stand: Supervision           General transfer comment: supervision with occasional cue for safety     Balance Overall balance  assessment: Needs assistance Sitting-balance support: Feet supported;No upper extremity supported Sitting balance-Leahy Scale: Good     Standing balance support: During functional activity;Reliant on assistive device for balance Standing balance-Leahy Scale: Fair Standing balance comment: dependent on RW for safe amb                           ADL either performed or assessed with clinical judgement   ADL Overall ADL's : Needs assistance/impaired                     Lower Body Dressing: Supervision/safety;Sitting/lateral leans Lower Body Dressing Details (indicate cue type and reason): doffed and donned socks with verbal cues for safety Toilet Transfer: Supervision/safety;Cueing for safety;Rolling walker (2 wheels) Toilet Transfer Details (indicate cue type and reason): able to maneuver with opening and closing door and manageing RW         Functional mobility during ADLs: Supervision/safety;Rolling walker (2 wheels) General ADL Comments: Patient states no complaints of dizziness    Extremity/Trunk Assessment              Vision       Perception     Praxis      Cognition Arousal/Alertness: Awake/alert Behavior During Therapy: WFL for tasks assessed/performed Overall Cognitive Status: Difficult to assess                                 General Comments: demonstrated good safety          Exercises     Shoulder  Instructions       General Comments      Pertinent Vitals/ Pain       Pain Assessment: Faces Faces Pain Scale: Hurts a little bit Pain Location: R knee Pain Descriptors / Indicators: Tightness Pain Intervention(s): Monitored during session  Home Living                                          Prior Functioning/Environment              Frequency  Min 2X/week        Progress Toward Goals  OT Goals(current goals can now be found in the care plan section)  Progress towards OT goals:  Progressing toward goals  Acute Rehab OT Goals Patient Stated Goal: go home OT Goal Formulation: With patient Time For Goal Achievement: 09/29/21 Potential to Achieve Goals: Good ADL Goals Pt Will Perform Lower Body Bathing: with modified independence;sit to/from stand Pt Will Perform Lower Body Dressing: with modified independence;sit to/from stand Pt Will Transfer to Toilet: with modified independence;ambulating Pt Will Perform Tub/Shower Transfer: Tub transfer;with modified independence;ambulating;rolling walker  Plan Discharge plan remains appropriate    Co-evaluation                 AM-PAC OT "6 Clicks" Daily Activity     Outcome Measure   Help from another person eating meals?: None Help from another person taking care of personal grooming?: None Help from another person toileting, which includes using toliet, bedpan, or urinal?: A Little Help from another person bathing (including washing, rinsing, drying)?: A Little Help from another person to put on and taking off regular upper body clothing?: None Help from another person to put on and taking off regular lower body clothing?: A Little 6 Click Score: 21    End of Session Equipment Utilized During Treatment: Rolling walker (2 wheels)  OT Visit Diagnosis: Unsteadiness on feet (R26.81);Other abnormalities of gait and mobility (R26.89);Repeated falls (R29.6);Pain Pain - Right/Left: Right Pain - part of body: Knee   Activity Tolerance Patient tolerated treatment well   Patient Left in bed;with call bell/phone within reach;Other (comment) (sitting on EOB)   Nurse Communication Mobility status        Time: 9326-7124 OT Time Calculation (min): 23 min  Charges: OT General Charges $OT Visit: 1 Visit OT Treatments $Self Care/Home Management : 23-37 mins  Lodema Hong, [place]  Pager [phone] Office [place] 09/19/2021, 8:07 AM

## 2021-09-19 NOTE — Progress Notes (Signed)
Patient awaiting transport via wheelchair by volunteer for discharge home; in no acute distress nor complaints of pain nor discomfort; incision on his right knee with hydrocolloid dressing and is clean, dry and intact; room was checked and accounted for all his belongings; discharge instructions concerning his medications, incision care, follow up appointment and when to call the doctor as needed were all discussed with patient's daughter by RN and she expressed understanding on the instructions given.

## 2021-09-19 NOTE — Progress Notes (Signed)
Physical Therapy Treatment Patient Details Name: Bryan Thomas MRN: [id number] DOB: 1941/09/19 Today's Date: 09/19/2021   History of Present Illness Patient is a 80 y/o male who presents s/p right TKA 09/17/21.    PT Comments    Pt making excellent progress towards his physical therapy goals this session; denies dizziness/lightheadedness. Reviewed HEP for right knee strengthening and ROM (written handout provided). Pt achieving 105 degrees of seated knee flexion. Ambulating 200 feet with a walker without physical assist; utilizing step through pattern. Will benefit from OPPT for further strengthening, ROM and gait training.    Recommendations for follow up therapy are one component of a multi-disciplinary discharge planning process, led by the attending physician.  Recommendations may be updated based on patient status, additional functional criteria and insurance authorization.  Follow Up Recommendations  Follow physician's recommendations for discharge plan and follow up therapies     Assistance Recommended at Discharge Frequent or constant Supervision/Assistance  Equipment Recommendations  None recommended by PT    Recommendations for Other Services       Precautions / Restrictions Precautions Precautions: Knee Precaution Booklet Issued: No Restrictions Weight Bearing Restrictions: Yes RLE Weight Bearing: Weight bearing as tolerated     Mobility  Bed Mobility Overal bed mobility: Modified Independent Bed Mobility: Supine to Sit     Supine to sit: Modified independent (Device/Increase time)     General bed mobility comments: able to get to eob with initiation cue    Transfers Overall transfer level: Modified independent Equipment used: Rolling walker (2 wheels) Transfers: Sit to/from Stand Sit to Stand: Supervision           General transfer comment: supervision with occasional cue for safety    Ambulation/Gait Ambulation/Gait assistance: Modified  independent (Device/Increase time) Gait Distance (Feet): 200 Feet Assistive device: Rolling walker (2 wheels) Gait Pattern/deviations: Step-through pattern;Decreased stride length;Trunk flexed Gait velocity: decreased     General Gait Details: good step through pattern, cues for glute activation and closer walker proximity, in addition to decreased R step length   Stairs             Wheelchair Mobility    Modified Rankin (Stroke Patients Only)       Balance Overall balance assessment: Needs assistance Sitting-balance support: Feet supported;No upper extremity supported Sitting balance-Leahy Scale: Good     Standing balance support: During functional activity;Reliant on assistive device for balance Standing balance-Leahy Scale: Fair Standing balance comment: dependent on RW for safe amb                            Cognition Arousal/Alertness: Awake/alert Behavior During Therapy: WFL for tasks assessed/performed Overall Cognitive Status: Within Functional Limits for tasks assessed                                 General Comments: demonstrated good safety        Exercises Total Joint Exercises Quad Sets: Right;10 reps;Supine Heel Slides: Right;Supine;15 reps;Seated Hip ABduction/ADduction: Right;10 reps;Supine Straight Leg Raises: Right;10 reps;Supine Long Arc Quad: Right;10 reps;Supine Goniometric ROM: 1-105 degrees    General Comments        Pertinent Vitals/Pain Pain Assessment: Faces Faces Pain Scale: Hurts a little bit Pain Location: R knee Pain Descriptors / Indicators: Tightness Pain Intervention(s): Monitored during session    Home Living  Prior Function            PT Goals (current goals can now be found in the care plan section) Acute Rehab PT Goals Potential to Achieve Goals: Good Progress towards PT goals: Progressing toward goals    Frequency    7X/week      PT  Plan Current plan remains appropriate    Co-evaluation              AM-PAC PT "6 Clicks" Mobility   Outcome Measure  Help needed turning from your back to your side while in a flat bed without using bedrails?: None Help needed moving from lying on your back to sitting on the side of a flat bed without using bedrails?: None Help needed moving to and from a bed to a chair (including a wheelchair)?: None Help needed standing up from a chair using your arms (e.g., wheelchair or bedside chair)?: None Help needed to walk in hospital room?: None Help needed climbing 3-5 steps with a railing? : A Little 6 Click Score: 23    End of Session Equipment Utilized During Treatment: Gait belt Activity Tolerance: Patient tolerated treatment well Patient left: with call bell/phone within reach;with family/visitor present;in bed Nurse Communication: Mobility status PT Visit Diagnosis: Pain;Difficulty in walking, not elsewhere classified (R26.2);Unsteadiness on feet (R26.81);Dizziness and giddiness (R42) Pain - Right/Left: Right Pain - part of body: Knee     Time: 7342-8768 PT Time Calculation (min) (ACUTE ONLY): 22 min  Charges:  $Gait Training: 8-22 mins                     Wyona Almas, PT, DPT Acute Rehabilitation Services Pager [phone] Office [phone]    Deno Etienne 09/19/2021, 10:45 AM

## 2021-09-19 NOTE — Progress Notes (Signed)
Subjective: 2 Days Post-Op Procedure(s) (LRB): RIGHT TOTAL KNEE ARTHROPLASTY (Right) Patient reports pain as mild.  Doing much better with tramadol. No dizziness.some itching, but much better  Objective: Vital signs in last 24 hours: Temp:  [97.4 F (36.3 C)-98.2 F (36.8 C)] 97.4 F (36.3 C) (11/30 0333) Pulse Rate:  [64-72] 64 (11/30 0333) Resp:  [18-20] 20 (11/30 0333) BP: (138-181)/(72-95) 157/95 (11/30 0333) SpO2:  [93 %-100 %] 100 % (11/30 0333)  Intake/Output from previous day: 11/29 0701 - 11/30 0700 In: 660 [P.O.:660] Out: -  Intake/Output this shift: No intake/output data recorded.  Recent Labs    09/18/21 0338  HGB 14.2   Recent Labs    09/18/21 0338  WBC 15.2*  RBC 4.46  HCT 43.2  PLT 296   Recent Labs    09/18/21 0338  NA 136  K 4.4  CL 101  CO2 28  BUN 15  CREATININE 0.96  GLUCOSE 141*  CALCIUM 8.8*   No results for input(s): LABPT, INR in the last 72 hours.  Neurologically intact Neurovascular intact Sensation intact distally Intact pulses distally Dorsiflexion/Plantar flexion intact Incision: dressing C/D/I No cellulitis present Compartment soft   Assessment/Plan: 2 Days Post-Op Procedure(s) (LRB): RIGHT TOTAL KNEE ARTHROPLASTY (Right) Advance diet Up with therapy D/C IV fluids Discharge home with home health  WBAT RLE    Anticipated LOS equal to or greater than 2 midnights due to - Age 82 and older with one or more of the following:  - Obesity  - Expected need for hospital services (PT, OT, Nursing) required for safe  discharge  - Anticipated need for postoperative skilled nursing care or inpatient rehab  - Active co-morbidities: None OR   - Unanticipated findings during/Post Surgery: Slow post-op progression: GI, pain control, mobility  - Patient is a high risk of re-admission due to: None   Bryan Thomas 09/19/2021, 7:55 AM

## 2021-09-19 NOTE — Plan of Care (Signed)
  Problem: Safety: Goal: Ability to remain free from injury will improve Outcome: Completed/Met   Problem: Education: Goal: Knowledge of the prescribed therapeutic regimen will improve Outcome: Completed/Met Goal: Individualized Educational Video(s) Outcome: Completed/Met   Problem: Activity: Goal: Ability to avoid complications of mobility impairment will improve Outcome: Completed/Met Goal: Range of joint motion will improve Outcome: Completed/Met   Problem: Clinical Measurements: Goal: Postoperative complications will be avoided or minimized Outcome: Completed/Met   Problem: Pain Management: Goal: Pain level will decrease with appropriate interventions Outcome: Completed/Met   Problem: Skin Integrity: Goal: Will show signs of wound healing Outcome: Completed/Met   

## 2021-09-20 ENCOUNTER — Telehealth: Payer: Self-pay | Admitting: *Deleted

## 2021-09-20 NOTE — Telephone Encounter (Signed)
Ortho bundle D/C call completed. 

## 2021-09-21 DIAGNOSIS — Z471 Aftercare following joint replacement surgery: Secondary | ICD-10-CM | POA: Diagnosis not present

## 2021-09-21 DIAGNOSIS — Z96651 Presence of right artificial knee joint: Secondary | ICD-10-CM | POA: Diagnosis not present

## 2021-09-21 DIAGNOSIS — K5900 Constipation, unspecified: Secondary | ICD-10-CM | POA: Diagnosis not present

## 2021-09-21 DIAGNOSIS — Z7982 Long term (current) use of aspirin: Secondary | ICD-10-CM | POA: Diagnosis not present

## 2021-09-26 ENCOUNTER — Encounter: Payer: Self-pay | Admitting: Orthopaedic Surgery

## 2021-09-27 ENCOUNTER — Telehealth: Payer: Self-pay | Admitting: *Deleted

## 2021-09-27 ENCOUNTER — Other Ambulatory Visit: Payer: Self-pay | Admitting: Physician Assistant

## 2021-09-27 ENCOUNTER — Other Ambulatory Visit: Payer: Self-pay | Admitting: *Deleted

## 2021-09-27 DIAGNOSIS — Z96651 Presence of right artificial knee joint: Secondary | ICD-10-CM

## 2021-09-27 DIAGNOSIS — M1711 Unilateral primary osteoarthritis, right knee: Secondary | ICD-10-CM

## 2021-09-27 MED ORDER — TRAMADOL HCL 50 MG PO TABS: 50.0000 mg | ORAL_TABLET | Freq: Three times a day (TID) | ORAL | 0 refills | Status: DC | PRN

## 2021-09-27 NOTE — Telephone Encounter (Signed)
Sent in

## 2021-09-27 NOTE — Telephone Encounter (Signed)
Ortho bundle 7 day call completed. 

## 2021-10-02 ENCOUNTER — Telehealth: Payer: Self-pay | Admitting: *Deleted

## 2021-10-02 ENCOUNTER — Other Ambulatory Visit: Payer: Self-pay

## 2021-10-02 ENCOUNTER — Ambulatory Visit (INDEPENDENT_AMBULATORY_CARE_PROVIDER_SITE_OTHER): Payer: PPO | Admitting: Orthopaedic Surgery

## 2021-10-02 VITALS — BP 127/82 | HR 77 | Temp 97.3°F

## 2021-10-02 DIAGNOSIS — Z96651 Presence of right artificial knee joint: Secondary | ICD-10-CM

## 2021-10-02 NOTE — Progress Notes (Signed)
° °  Post-Op Visit Note   Patient: Bryan Thomas           Date of Birth: 05-11-1941           MRN: [id number] Visit Date: 10/02/2021 PCP: Venia Carbon, MD   Assessment & Plan:  Chief Complaint:  Chief Complaint  Patient presents with   Right Knee - Routine Post Op   Visit Diagnoses:  1. Status post total right knee replacement     Plan: Kristoph is a 2-week status post right total knee replacement.  He had a refill on tramadol yesterday.  Overall doing okay.  He is taking aspirin and Robaxin as well.  Feels a lot of soreness.  He has been compliant with the CPM.  Is not been happy with the home health physical therapist.    Right knee shows healed surgical incision.  No signs of infection.  Moderate swelling.  Range of motion 5 to 90 degrees without significant discomfort.  Stable to varus valgus.  No calf tenderness.  He is slightly short of breath but also dealing with a cough.  Vital signs obtained today.  Sutures removed Steri-Strips applied.  Handicap placard implant card provided.  Continue to refrain from driving.  He is scheduled for outpatient PT at our office starting tomorrow.    Follow-Up Instructions: Return in about 4 weeks (around 10/30/2021).   Orders:  No orders of the defined types were placed in this encounter.  No orders of the defined types were placed in this encounter.   Imaging: No results found.  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   Pre-op examination 09/10/2021   Cough 09/10/2021   Primary osteoarthritis of left knee 08/07/2021   Primary osteoarthritis of right knee 08/07/2021   Sensory loss 02/13/2016   Preventative health care 02/13/2016   Advance directive discussed with patient 02/13/2016   Generalized osteoarthritis of multiple sites    Past Medical History:  Diagnosis Date   Generalized osteoarthritis of multiple sites     Family History   Problem Relation Age of Onset   Cancer Mother    Diabetes Sister    Diabetes Brother    Heart disease Brother        CABG   Diabetes Brother    Diabetes Brother     Past Surgical History:  Procedure Laterality Date   KNEE ARTHROSCOPY Right 06/21/1989   LUMBAR [place] SURGERY  06/21/1969   TONSILLECTOMY     removed as a teenager   TOTAL KNEE ARTHROPLASTY Right 09/17/2021   Procedure: RIGHT TOTAL KNEE ARTHROPLASTY;  Surgeon: Leandrew Koyanagi, MD;  Location: [facility];  Service: Orthopedics;  Laterality: Right;   Social History   Occupational History   Occupation: TEFL teacher    Comment: tired  Tobacco Use   Smoking status: Former   Smokeless tobacco: Former    Types: Chew    Quit date: 08/21/2021  Vaping Use   Vaping Use: Never used  Substance and Sexual Activity   Alcohol use: No    Alcohol/week: 0.0 standard drinks   Drug use: Never   Sexual activity: Not on file

## 2021-10-02 NOTE — Telephone Encounter (Signed)
Ortho bundle 14 day in office visit today.

## 2021-10-03 ENCOUNTER — Ambulatory Visit: Payer: PPO | Admitting: Physical Therapy

## 2021-10-03 ENCOUNTER — Encounter: Payer: Self-pay | Admitting: Physical Therapy

## 2021-10-03 DIAGNOSIS — R600 Localized edema: Secondary | ICD-10-CM

## 2021-10-03 DIAGNOSIS — R262 Difficulty in walking, not elsewhere classified: Secondary | ICD-10-CM

## 2021-10-03 DIAGNOSIS — M25661 Stiffness of right knee, not elsewhere classified: Secondary | ICD-10-CM | POA: Diagnosis not present

## 2021-10-03 DIAGNOSIS — M6281 Muscle weakness (generalized): Secondary | ICD-10-CM

## 2021-10-03 DIAGNOSIS — M25561 Pain in right knee: Secondary | ICD-10-CM

## 2021-10-03 NOTE — Therapy (Signed)
[place], [place], [address] Phone: [phone]   Fax:  [phone]  Physical Therapy Evaluation  Patient Details  Name: Bryan Thomas MRN: [id number] Date of Birth: Jul 05, 1941 Referring Provider (PT): Frankey Shown MD   Encounter Date: 10/03/2021   PT End of Session - 10/03/21 1119     Visit Number 1    Number of Visits 17    Date for PT Re-Evaluation 11/30/21    PT Start Time 1104    PT Stop Time 1145    PT Time Calculation (min) 41 min    Activity Tolerance Patient tolerated treatment well    Behavior During Therapy [facility] for tasks assessed/performed             Past Medical History:  Diagnosis Date   Generalized osteoarthritis of multiple sites     Past Surgical History:  Procedure Laterality Date   KNEE ARTHROSCOPY Right 06/21/1989   LUMBAR [place] SURGERY  06/21/1969   TONSILLECTOMY     removed as a teenager   TOTAL KNEE ARTHROPLASTY Right 09/17/2021   Procedure: RIGHT TOTAL KNEE ARTHROPLASTY;  Surgeon: Leandrew Koyanagi, MD;  Location: [place];  Service: Orthopedics;  Laterality: Right;    There were no vitals filed for this visit.    Subjective Assessment - 10/03/21 1114     Subjective Pt arriving today s/p right total knee replacement on 09/17/2021. Pt reporting 8/10 pain. Pt amb with rolling walker. Pt states he used a straight cane periodically before his surgery.    Pertinent History PMH:  right knee arthroscopy 1990, lumbar disc surgery 1970    Limitations House hold activities;Walking    Patient Stated Goals Walk without walker, stop hurting, work in shop again    Currently in Pain? Yes    Pain Score 8     Pain Location Knee    Pain Orientation Right    Pain Descriptors / Indicators Aching;Tightness    Pain Type Surgical pain    Pain Onset 1 to 4 weeks ago    Pain Frequency Constant    Aggravating Factors  beding, when first getting up,    Pain Relieving Factors pain meds, ice    Effect  of Pain on Daily Activities diffculty working in my shop, difficlty walking and with household activities                [facility] PT Assessment - 10/03/21 0001       Assessment   Medical Diagnosis right TKA M17.11 primary OA of right knee, Z96.651 right knee total replacement    Referring Provider (PT) Frankey Shown MD    Onset Date/Surgical Date 09/17/21    Hand Dominance Right    Prior Therapy yes HHPT 5 visits      Precautions   Precautions None      Restrictions   Weight Bearing Restrictions No      Balance Screen   Has the patient fallen in the past 6 months Yes    How many times? 1    Is the patient reluctant to leave their home because of a fear of falling?  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[employer], therapeutic   Overall Cognitive Status Within [place] for tasks assessed      Observation/Other Assessments   Focus on Therapeutic Outcomes (  FOTO)  31% (Predicted 53%      Observation/Other Assessments-Edema    Edema Circumferential      Circumferential Edema   Circumferential - Right 49.5 centimeters around mid patella    Circumferential - Left  44.5 centimeters around mid patella      ROM / Strength   AROM / PROM / Strength AROM;PROM;Strength      AROM   AROM Assessment Site Knee    Right/Left Knee Right;Left    Right Knee Extension -8    Right Knee Flexion 85    Left Knee Extension 0    Left Knee Flexion 122      PROM   PROM Assessment Site Knee    Right/Left Knee Right    Right Knee Extension -5    Right Knee Flexion 88      Strength   Strength Assessment Site Knee    Right/Left Knee Right;Left    Right Knee Flexion 3/5    Right Knee Extension 3/5    Left Knee Flexion 5/5    Left Knee Extension 5/5      Palpation   Palpation comment TTP, medial joint line of right knee      Transfers   Five time sit to stand comments  33 seconds with UE support       Ambulation/Gait   Assistive device Rolling walker    Gait Pattern Step-through pattern;Decreased step length - right;Decreased step length - left;Decreased stance time - right;Antalgic    Ambulation Surface Level                        Objective measurements completed on examination: See above findings.       [facility] Adult PT Treatment/Exercise - 10/03/21 0001       Posture/Postural Control   Posture/Postural Control Postural limitations    Postural Limitations Rounded Shoulders;Forward head;Decreased lumbar lordosis      Exercises   Exercises Knee/Hip      Knee/Hip Exercises: Seated   Long Arc Quad Strengthening;AROM;Right;5 reps    Sit to Sand 5 reps;with UE support      Knee/Hip Exercises: Supine   Quad Sets AROM;Both;10 reps;Limitations    Quad Sets Limitations holding 5 seconds    Bridges Strengthening;Both;5 reps    Bridges Limitations holding 5 seconds    Straight Leg Raises Strengthening;Right;5 reps                     PT Education - 10/03/21 1118     Education Details PT POC, HEP    Person(s) Educated Patient    Methods Explanation;Handout;Demonstration;Tactile cues;Verbal cues    Comprehension Returned demonstration;Verbalized understanding              PT Short Term Goals - 10/03/21 1120       PT SHORT TERM GOAL #1   Title Pt will be independent in his initial HEP    Time 3    Period Weeks    Status New    Target Date 10/26/21      PT SHORT TERM GOAL #2   Title Pt will improve 5 time sit to stand to </= 20 seconds with UE support    Baseline 33 seconds on 10/03/2021    Time 4    Period Weeks    Status New               PT Long Term Goals - 10/03/21 1155  PT LONG TERM GOAL #1   Title Pt will be indepdent in his advanced HEP    Time 8    Period Weeks    Status New    Target Date 11/30/21      PT LONG TERM GOAL #2   Title Pt will be able to amb with no device on level surfaces for 1000 feet  using step through gait pattern safely.    Baseline amb level surfaces with rolling walker    Time 8    Period Weeks    Status New    Target Date 11/30/21      PT LONG TERM GOAL #3   Title Pt will be able to climb 1 flight of stairs with single hand rail with step over step pattern with pain </= 3/10.    Time 8    Period Weeks    Status New    Target Date 11/30/21      PT LONG TERM GOAL #4   Title pt will improve his right knee AROM to >/= 120 degrees in order to improve funcitonal mobility with pain </= 3/10.    Time 8    Period Weeks    Status New    Target Date 11/30/21      PT LONG TERM GOAL #5   Title Pt will improve his FOTO to >/= 53 %    Baseline 31% on 10/03/2021    Time 8    Period Weeks    Status New    Target Date 11/30/21                    Plan - 10/03/21 1152     Clinical Impression Statement Pt presenting today s/p right total knee arthroplasty on 09/17/2021. Pt presenting with weakness noted in his right LE and decreased AROM. Pt's AROM arc was measured -8 to 85 degrees. Pt with 5 centimeters of edema noted in right knee when compared to his left. Pt reporting pain varies depending on activity. Pt was instructed in HEP and able to return demonstration. Exercise handout issued and pt's daughter informed on how to acces his HEP online. Pt would benefit from skilled PT to maximize pt's function with the below interventions.    Personal Factors and Comorbidities Comorbidity 3+    Comorbidities right knee arthroscopy 1990, lumbar disc surgery 1970    Examination-Activity Limitations Lift;Transfers;Squat;Stairs;Stand;Dressing;Sit    Examination-Participation Restrictions Community Activity;Other;Yard Work    Stability/Clinical Decision Making Stable/Uncomplicated    Clinical Decision Making Low    Rehab Potential Good    PT Frequency 2x / week    PT Duration 8 weeks    PT Treatment/Interventions ADLs/Self Care Home Management;Vasopneumatic  Device;Taping;Cryotherapy;Counsellor;Therapeutic exercise;Therapeutic activities;Functional mobility training;Stair training;Gait training;Neuromuscular re-education;Patient/family education;Passive range of motion;Scar mobilization;Manual techniques    PT Next Visit Plan Nustep, quad streghening, ROM, vasopneumatic    PT Home Exercise Plan Access Code: [email or web address]  URL: [email or web address]  Date: 10/03/2021  Prepared by: Kearney Hard    Exercises  Supine Quad Set - 3 x daily - 7 x weekly - 2 sets - 10 reps - 5 seconds hold  Supine Heel Slide - 3 x daily - 7 x weekly - 2 sets - 10 reps - 3-5 seconds hold  Supine Bridge - 3 x daily - 7 x weekly - 2 sets - 10 reps - 3 seconds hold  Seated Knee Extension AROM - 3 x daily - 7 x weekly - 2 sets -  10 reps - 3-5 seconds hold  Sit to Stand with Armchair - 3 x daily - 7 x weekly - 10 reps    Consulted and Agree with Plan of Care Patient;Family member/caregiver    Family Member Consulted daughter, [name]             Patient will benefit from skilled therapeutic intervention in order to improve the following deficits and impairments:  Pain, Difficulty walking, Decreased activity tolerance, Decreased balance, Decreased mobility, Decreased strength, Increased edema, Impaired flexibility  Visit Diagnosis: Acute pain of right knee - Plan: PT plan of care cert/re-cert  Difficulty in walking, not elsewhere classified - Plan: PT plan of care cert/re-cert  Muscle weakness (generalized) - Plan: PT plan of care cert/re-cert  Localized edema - Plan: PT plan of care cert/re-cert  Stiffness of right knee, not elsewhere classified - Plan: PT plan of care cert/re-cert     Problem List Patient Active Problem List   Diagnosis Date Noted   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   Pre-op examination 09/10/2021   Cough 09/10/2021   Primary osteoarthritis of left knee 08/07/2021   Primary  osteoarthritis of right knee 08/07/2021   Sensory loss 02/13/2016   Preventative health care 02/13/2016   Advance directive discussed with patient 02/13/2016   Generalized osteoarthritis of multiple sites     Oretha Caprice, PT, MPT 10/03/2021, 12:12 PM  [facility] Physical Therapy [address] [place], [place], [address] Phone: [phone]   Fax:  [phone]  Name: KACPER CARTLIDGE MRN: [id number] Date of Birth: 12-11-1940

## 2021-10-03 NOTE — Patient Instructions (Signed)
Access Code: [email or web address] URL: [email or web address] Date: 10/03/2021 Prepared by: Kearney Hard  Exercises Supine Quad Set - 3 x daily - 7 x weekly - 2 sets - 10 reps - 5 seconds hold Supine Heel Slide - 3 x daily - 7 x weekly - 2 sets - 10 reps - 3-5 seconds hold Supine Bridge - 3 x daily - 7 x weekly - 2 sets - 10 reps - 3 seconds hold Seated Knee Extension AROM - 3 x daily - 7 x weekly - 2 sets - 10 reps - 3-5 seconds hold Sit to Stand with Armchair - 3 x daily - 7 x weekly - 10 reps

## 2021-10-05 ENCOUNTER — Encounter: Payer: Self-pay | Admitting: Rehabilitative and Restorative Service Providers"

## 2021-10-05 ENCOUNTER — Other Ambulatory Visit: Payer: Self-pay

## 2021-10-05 ENCOUNTER — Ambulatory Visit: Payer: PPO | Admitting: Rehabilitative and Restorative Service Providers"

## 2021-10-05 DIAGNOSIS — R600 Localized edema: Secondary | ICD-10-CM | POA: Diagnosis not present

## 2021-10-05 DIAGNOSIS — M6281 Muscle weakness (generalized): Secondary | ICD-10-CM | POA: Diagnosis not present

## 2021-10-05 DIAGNOSIS — M25661 Stiffness of right knee, not elsewhere classified: Secondary | ICD-10-CM

## 2021-10-05 DIAGNOSIS — R262 Difficulty in walking, not elsewhere classified: Secondary | ICD-10-CM | POA: Diagnosis not present

## 2021-10-05 DIAGNOSIS — M25561 Pain in right knee: Secondary | ICD-10-CM

## 2021-10-05 NOTE — Patient Instructions (Signed)
Added tailgate knee flexion to existing HEP.  Encouraged 30-50/Day seated SLR.

## 2021-10-05 NOTE — Therapy (Signed)
[place], [place], [address] Phone: [phone]   Fax:  [phone]  Physical Therapy Treatment  Patient Details  Name: Bryan Thomas MRN: [id number] Date of Birth: [birth year] Referring Provider (PT): Frankey Shown MD   Encounter Date: 10/05/2021   PT End of Session - 10/05/21 1602     Visit Number 2    Number of Visits 17    Date for PT Re-Evaluation 11/30/21    PT Start Time 1505    PT Stop Time 1554    PT Time Calculation (min) 49 min    Activity Tolerance Patient tolerated treatment well    Behavior During Therapy [facility] for tasks assessed/performed             Past Medical History:  Diagnosis Date   Generalized osteoarthritis of multiple sites     Past Surgical History:  Procedure Laterality Date   KNEE ARTHROSCOPY Right 06/21/1989   LUMBAR [place] SURGERY  06/21/1969   TONSILLECTOMY     removed as a teenager   TOTAL KNEE ARTHROPLASTY Right 09/17/2021   Procedure: RIGHT TOTAL KNEE ARTHROPLASTY;  Surgeon: Leandrew Koyanagi, MD;  Location: [place];  Service: Orthopedics;  Laterality: Right;    There were no vitals filed for this visit.   Subjective Assessment - 10/05/21 1546     Subjective [name] reports "some" HEP compliance.  "Not as much as you want me to."    Pertinent History PMH:  right knee arthroscopy 1990, lumbar disc surgery 1970    Limitations House hold activities;Walking    Patient Stated Goals Walk without walker, stop hurting, work in shop again    Currently in Pain? Yes    Pain Score 7     Pain Location Knee    Pain Orientation Right    Pain Descriptors / Indicators Aching;Tightness    Pain Type Surgical pain    Pain Radiating Towards Knee    Pain Onset 1 to 4 weeks ago    Pain Frequency Intermittent    Aggravating Factors  Inceases with WB, decreases with rest and ice    Pain Relieving Factors Rest and ice    Effect of Pain on Daily Activities Limits WB endurance and function.   Needs walker to get around for short distances.    Multiple Pain Sites No                [facility] PT Assessment - 10/05/21 0001       AROM   Right Knee Extension -6    Right Knee Flexion 85                           [facility] Adult PT Treatment/Exercise - 10/05/21 0001       Posture/Postural Control   Posture/Postural Control Postural limitations    Postural Limitations Rounded Shoulders;Forward head;Decreased lumbar lordosis      Exercises   Exercises Knee/Hip      Knee/Hip Exercises: Stretches   Other Knee/Hip Stretches Tailgate knee flexion AROM 3 minutes      Knee/Hip Exercises: Aerobic   Stationary Bike Seat 9 for 8 minutes AAROM only (stretch into flexion and extension)      Knee/Hip Exercises: Seated   Long Arc Quad Strengthening;Both;3 sets;5 reps;Limitations    Long Arc Quad Limitations Seated straight leg raises with pillows behind back in chair    Sit to [employer] 5 reps;with UE support  Knee/Hip Exercises: Supine   Quad Sets Strengthening;Both;10 reps;Limitations    Quad Sets Limitations 5 seconds (toes back, press knees down and tighten thighs)    Bridges Strengthening;Both;10 reps;Limitations    Bridges Limitations 5 seconds    Straight Leg Raises --                     PT Education - 10/05/21 1550     Education Details Reviewed HEP with corrective feedback provided.  Added seated tailgate knee flexion to HEP.    Person(s) Educated Patient    Methods Explanation;Demonstration;Tactile cues;Verbal cues    Comprehension Verbalized understanding;Tactile cues required;Need further instruction;Returned demonstration;Verbal cues required              PT Short Term Goals - 10/03/21 1120       PT SHORT TERM GOAL #1   Title Pt will be independent in his initial HEP    Time 3    Period Weeks    Status New    Target Date 10/26/21      PT SHORT TERM GOAL #2   Title Pt will improve 5 time sit to stand to </= 20 seconds with  UE support    Baseline 33 seconds on 10/03/2021    Time 4    Period Weeks    Status New               PT Long Term Goals - 10/03/21 1155       PT LONG TERM GOAL #1   Title Pt will be indepdent in his advanced HEP    Time 8    Period Weeks    Status New    Target Date 11/30/21      PT LONG TERM GOAL #2   Title Pt will be able to amb with no device on level surfaces for 1000 feet using step through gait pattern safely.    Baseline amb level surfaces with rolling walker    Time 8    Period Weeks    Status New    Target Date 11/30/21      PT LONG TERM GOAL #3   Title Pt will be able to climb 1 flight of stairs with single hand rail with step over step pattern with pain </= 3/10.    Time 8    Period Weeks    Status New    Target Date 11/30/21      PT LONG TERM GOAL #4   Title pt will improve his right knee AROM to >/= 120 degrees in order to improve funcitonal mobility with pain </= 3/10.    Time 8    Period Weeks    Status New    Target Date 11/30/21      PT LONG TERM GOAL #5   Title Pt will improve his FOTO to >/= 53 %    Baseline 31% on 10/03/2021    Time 8    Period Weeks    Status New    Target Date 11/30/21                   Plan - 10/05/21 1603     Clinical Impression Statement [name] gave great effort with his PT in the office.  I encouraged him to focus on seated SLR (30-50/Day) and tailgate knee flexion 3-5X/day for 3 minutes at home due to his early HEP struggles.  I reminded him all exercises are beneficial and knee extension AROM, flexion  AROM, quadriceps strength and edema control are all important at this phase post-surgery.  Continue current plan to meet ST and LTGs.    Personal Factors and Comorbidities Comorbidity 3+    Comorbidities right knee arthroscopy 1990, lumbar disc surgery 1970    Examination-Activity Limitations Lift;Transfers;Squat;Stairs;Stand;Dressing;Sit    Examination-Participation Restrictions Community  Activity;Other;Yard Work    Stability/Clinical Decision Making Stable/Uncomplicated    Rehab Potential Good    PT Frequency 2x / week    PT Duration 8 weeks    PT Treatment/Interventions ADLs/Self Care Home Management;Vasopneumatic Device;Taping;Cryotherapy;Counsellor;Therapeutic exercise;Therapeutic activities;Functional mobility training;Stair training;Gait training;Neuromuscular re-education;Patient/family education;Passive range of motion;Scar mobilization;Manual techniques    PT Next Visit Plan Emphasis on AROM, quadriceps strength, edema control    PT Home Exercise Plan Access Code: [email or web address]    Consulted and Agree with Plan of Care Patient             Patient will benefit from skilled therapeutic intervention in order to improve the following deficits and impairments:  Pain, Difficulty walking, Decreased activity tolerance, Decreased balance, Decreased mobility, Decreased strength, Increased edema, Impaired flexibility  Visit Diagnosis: Difficulty in walking, not elsewhere classified  Muscle weakness (generalized)  Localized edema  Stiffness of right knee, not elsewhere classified  Acute pain of right knee     Problem List Patient Active Problem List   Diagnosis Date Noted   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   Pre-op examination 09/10/2021   Cough 09/10/2021   Primary osteoarthritis of left knee 08/07/2021   Primary osteoarthritis of right knee 08/07/2021   Sensory loss 02/13/2016   Preventative health care 02/13/2016   Advance directive discussed with patient 02/13/2016   Generalized osteoarthritis of multiple sites     [place], [place], MPT 10/05/2021, 4:06 PM  [facility] Physical Therapy [address] [place], [place], [address] Phone: [phone]   Fax:  [phone]  Name: JAVONNE DORKO MRN: [id number] Date of Birth: 03-22-1941

## 2021-10-09 ENCOUNTER — Telehealth: Payer: Self-pay | Admitting: *Deleted

## 2021-10-09 ENCOUNTER — Encounter: Payer: Self-pay | Admitting: Physical Therapy

## 2021-10-09 ENCOUNTER — Ambulatory Visit: Payer: PPO | Admitting: Physical Therapy

## 2021-10-09 ENCOUNTER — Other Ambulatory Visit: Payer: Self-pay | Admitting: Physician Assistant

## 2021-10-09 ENCOUNTER — Other Ambulatory Visit: Payer: Self-pay

## 2021-10-09 DIAGNOSIS — M25561 Pain in right knee: Secondary | ICD-10-CM | POA: Diagnosis not present

## 2021-10-09 DIAGNOSIS — M6281 Muscle weakness (generalized): Secondary | ICD-10-CM

## 2021-10-09 DIAGNOSIS — R262 Difficulty in walking, not elsewhere classified: Secondary | ICD-10-CM | POA: Diagnosis not present

## 2021-10-09 DIAGNOSIS — M25661 Stiffness of right knee, not elsewhere classified: Secondary | ICD-10-CM | POA: Diagnosis not present

## 2021-10-09 DIAGNOSIS — R600 Localized edema: Secondary | ICD-10-CM

## 2021-10-09 MED ORDER — TRAMADOL HCL 50 MG PO TABS: 50.0000 mg | ORAL_TABLET | Freq: Three times a day (TID) | ORAL | 0 refills | Status: DC | PRN

## 2021-10-09 NOTE — Telephone Encounter (Signed)
Sent in

## 2021-10-09 NOTE — Telephone Encounter (Signed)
Patient called requesting refill of Tramadol. Thanks.

## 2021-10-09 NOTE — Telephone Encounter (Signed)
Call and updated daughter.

## 2021-10-09 NOTE — Therapy (Signed)
[place], [place], [address] Phone: [phone]   Fax:  [phone]  Physical Therapy Treatment  Patient Details  Name: Bryan Thomas MRN: [id number] Date of Birth: 1940/12/20 Referring Provider (PT): Frankey Shown MD   Encounter Date: 10/09/2021   PT End of Session - 10/09/21 1005     Visit Number 3    Number of Visits 17    Date for PT Re-Evaluation 11/30/21    PT Start Time 0844    PT Stop Time 0924    PT Time Calculation (min) 40 min    Activity Tolerance Patient tolerated treatment well    Behavior During Therapy [facility] for tasks assessed/performed             Past Medical History:  Diagnosis Date   Generalized osteoarthritis of multiple sites     Past Surgical History:  Procedure Laterality Date   KNEE ARTHROSCOPY Right 06/21/1989   LUMBAR [place] SURGERY  06/21/1969   TONSILLECTOMY     removed as a teenager   TOTAL KNEE ARTHROPLASTY Right 09/17/2021   Procedure: RIGHT TOTAL KNEE ARTHROPLASTY;  Surgeon: Leandrew Koyanagi, MD;  Location: [place];  Service: Orthopedics;  Laterality: Right;    There were no vitals filed for this visit.   Subjective Assessment - 10/09/21 0842     Subjective "I ain't never had a pain like this before."    Pertinent History PMH:  right knee arthroscopy 1990, lumbar disc surgery 1970    Limitations House hold activities;Walking    Patient Stated Goals Walk without walker, stop hurting, work in shop again    Currently in Pain? Yes    Pain Score 8     Pain Location Knee    Pain Orientation Right    Pain Descriptors / Indicators Aching;Tightness    Pain Type Surgical pain    Pain Onset 1 to 4 weeks ago    Pain Frequency Intermittent    Aggravating Factors  walking    Pain Relieving Factors rest/ice                [facility] PT Assessment - 10/09/21 0911       Assessment   Medical Diagnosis right TKA M17.11 primary OA of right knee, Z96.651 right knee total  replacement    Referring Provider (PT) Frankey Shown MD      PROM   Right Knee Flexion 95                           [facility] Adult PT Treatment/Exercise - 10/09/21 0847       Knee/Hip Exercises: Stretches   Passive Hamstring Stretch Right;3 reps;30 seconds    Passive Hamstring Stretch Limitations seated with overpressure at distal thigh    Knee: Self-Stretch Limitations Rt 10 x 10 sec hold; LLE overpressure    Other Knee/Hip Stretches Tailgate knee flexion AROM 3 minutes      Knee/Hip Exercises: Aerobic   Stationary Bike Seat 9 for 8 minutes partial to full revolutions      Knee/Hip Exercises: Seated   Long Arc Quad Right;2 sets;10 reps    Long Arc Quad Limitations 5 sec hold    Other Seated Knee/Hip Exercises seated SLR 2x10; 5 sec hold      Manual Therapy   Manual therapy comments supine knee extension and flexion to tolerance  PT Short Term Goals - 10/09/21 1005       PT SHORT TERM GOAL #1   Title Pt will be independent in his initial HEP    Time 3    Period Weeks    Status On-going    Target Date 10/26/21      PT SHORT TERM GOAL #2   Title Pt will improve 5 time sit to stand to </= 20 seconds with UE support    Baseline 33 seconds on 10/03/2021    Time 4    Period Weeks    Status On-going    Target Date 10/26/20               PT Long Term Goals - 10/03/21 1155       PT LONG TERM GOAL #1   Title Pt will be indepdent in his advanced HEP    Time 8    Period Weeks    Status New    Target Date 11/30/21      PT LONG TERM GOAL #2   Title Pt will be able to amb with no device on level surfaces for 1000 feet using step through gait pattern safely.    Baseline amb level surfaces with rolling walker    Time 8    Period Weeks    Status New    Target Date 11/30/21      PT LONG TERM GOAL #3   Title Pt will be able to climb 1 flight of stairs with single hand rail with step over step pattern with pain </=  3/10.    Time 8    Period Weeks    Status New    Target Date 11/30/21      PT LONG TERM GOAL #4   Title pt will improve his right knee AROM to >/= 120 degrees in order to improve funcitonal mobility with pain </= 3/10.    Time 8    Period Weeks    Status New    Target Date 11/30/21      PT LONG TERM GOAL #5   Title Pt will improve his FOTO to >/= 53 %    Baseline 31% on 10/03/2021    Time 8    Period Weeks    Status New    Target Date 11/30/21                   Plan - 10/09/21 1006     Clinical Impression Statement Pt with elevated pain today but able to tolerate exercises well with occasional rest needed.  Reinforced need for consistent work on HEP at home to maximize ROM and functional progress.  Will continue to benefit from PT to maximize function.    Personal Factors and Comorbidities Comorbidity 3+    Comorbidities right knee arthroscopy 1990, lumbar disc surgery 1970    Examination-Activity Limitations Lift;Transfers;Squat;Stairs;Stand;Dressing;Sit    Examination-Participation Restrictions Community Activity;Other;Yard Work    Stability/Clinical Decision Making Stable/Uncomplicated    Rehab Potential Good    PT Frequency 2x / week    PT Duration 8 weeks    PT Treatment/Interventions ADLs/Self Care Home Management;Vasopneumatic Device;Taping;Cryotherapy;Counsellor;Therapeutic exercise;Therapeutic activities;Functional mobility training;Stair training;Gait training;Neuromuscular re-education;Patient/family education;Passive range of motion;Scar mobilization;Manual techniques    PT Next Visit Plan Emphasis on AROM, quadriceps strength, edema control; trial gait training with cane    PT Home Exercise Plan Access Code: [email or web address]    Consulted and Agree with Plan of Care Patient  Patient will benefit from skilled therapeutic intervention in order to improve the following deficits and impairments:  Pain, Difficulty  walking, Decreased activity tolerance, Decreased balance, Decreased mobility, Decreased strength, Increased edema, Impaired flexibility  Visit Diagnosis: Difficulty in walking, not elsewhere classified  Muscle weakness (generalized)  Localized edema  Stiffness of right knee, not elsewhere classified  Acute pain of right knee     Problem List Patient Active Problem List   Diagnosis Date Noted   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   Pre-op examination 09/10/2021   Cough 09/10/2021   Primary osteoarthritis of left knee 08/07/2021   Primary osteoarthritis of right knee 08/07/2021   Sensory loss 02/13/2016   Preventative health care 02/13/2016   Advance directive discussed with patient 02/13/2016   Generalized osteoarthritis of multiple sites       Laureen Abrahams, PT, DPT 10/09/21 10:10 AM     [facility] [address] [place], [place], [address] Phone: [phone]   Fax:  [phone]  Name: Bryan Thomas MRN: [id number] Date of Birth: September 01, 1941

## 2021-10-11 ENCOUNTER — Ambulatory Visit: Payer: PPO | Admitting: Physical Therapy

## 2021-10-11 ENCOUNTER — Other Ambulatory Visit: Payer: Self-pay

## 2021-10-11 ENCOUNTER — Encounter: Payer: Self-pay | Admitting: Physical Therapy

## 2021-10-11 DIAGNOSIS — M25661 Stiffness of right knee, not elsewhere classified: Secondary | ICD-10-CM | POA: Diagnosis not present

## 2021-10-11 DIAGNOSIS — M25561 Pain in right knee: Secondary | ICD-10-CM

## 2021-10-11 DIAGNOSIS — R600 Localized edema: Secondary | ICD-10-CM

## 2021-10-11 DIAGNOSIS — R262 Difficulty in walking, not elsewhere classified: Secondary | ICD-10-CM

## 2021-10-11 DIAGNOSIS — M6281 Muscle weakness (generalized): Secondary | ICD-10-CM

## 2021-10-11 NOTE — Therapy (Signed)
[place], [place], [address] Phone: [phone]   Fax:  [phone]  Physical Therapy Treatment  Patient Details  Name: Bryan Thomas MRN: [id number] Date of Birth: 12/13/1940 Referring Provider (PT): Frankey Shown MD   Encounter Date: 10/11/2021   PT End of Session - 10/11/21 0954     Visit Number 4    Number of Visits 17    Date for PT Re-Evaluation 11/30/21    PT Start Time 0912    PT Stop Time 1002    PT Time Calculation (min) 50 min    Activity Tolerance Patient tolerated treatment well    Behavior During Therapy [facility] for tasks assessed/performed             Past Medical History:  Diagnosis Date   Generalized osteoarthritis of multiple sites     Past Surgical History:  Procedure Laterality Date   KNEE ARTHROSCOPY Right 06/21/1989   LUMBAR [place] SURGERY  06/21/1969   TONSILLECTOMY     removed as a teenager   TOTAL KNEE ARTHROPLASTY Right 09/17/2021   Procedure: RIGHT TOTAL KNEE ARTHROPLASTY;  Surgeon: Leandrew Koyanagi, MD;  Location: [facility];  Service: Orthopedics;  Laterality: Right;    There were no vitals filed for this visit.   Subjective Assessment - 10/11/21 0913     Subjective "I was doing okay until this morning.  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 Yes    Pain Score --   "I don't know, it just hurts."   Pain Location Knee    Pain Orientation Right    Pain Type Surgical pain;Acute pain    Pain Onset 1 to 4 weeks ago    Pain Frequency Intermittent    Aggravating Factors  walking    Pain Relieving Factors rest/ice                               [facility] Adult PT Treatment/Exercise - 10/11/21 8563       Ambulation/Gait   Gait Comments amb minguard A with SPC  and min cues for sequencing. pt safe to amb household distances with [facility] and level short outdoor surfaces; recommended he take RW with him for unknown or unlevel places      Knee/Hip Exercises: Stretches   Knee: Self-Stretch Limitations Rt 10 x 10 sec hold; LLE overpressure    Other Knee/Hip Stretches Tailgate knee flexion AROM 3 minutes      Knee/Hip Exercises: Aerobic   Stationary Bike Seat 9 for 8 minutes partial to full revolutions      Knee/Hip Exercises: Seated   Long Arc Quad Right;10 reps;3 sets;Weights    Long Arc Quad Weight 5 lbs.    Long Arc Quad Limitations 5 sec hold      Knee/Hip Exercises: Supine   Heel Slides AAROM;Right;10 reps      Modalities   Modalities Vasopneumatic      Vasopneumatic   Number Minutes Vasopneumatic  10 minutes    Vasopnuematic Location  Knee    Vasopneumatic Pressure Medium    Vasopneumatic Temperature  34      Manual Therapy   Manual therapy comments seated Rt knee flexion with Lt  knee extension 3x10                       PT Short Term Goals - 10/09/21 1005       PT SHORT TERM GOAL #1   Title Pt will be independent in his initial HEP    Time 3    Period Weeks    Status On-going    Target Date 10/26/21      PT SHORT TERM GOAL #2   Title Pt will improve 5 time sit to stand to </= 20 seconds with UE support    Baseline 33 seconds on 10/03/2021    Time 4    Period Weeks    Status On-going    Target Date 10/26/20               PT Long Term Goals - 10/03/21 1155       PT LONG TERM GOAL #1   Title Pt will be indepdent in his advanced HEP    Time 8    Period Weeks    Status New    Target Date 11/30/21      PT LONG TERM GOAL #2   Title Pt will be able to amb with no device on level surfaces for 1000 feet using step through gait pattern safely.    Baseline amb level surfaces with rolling walker    Time 8    Period Weeks    Status New    Target Date 11/30/21      PT LONG TERM GOAL #3   Title Pt will  be able to climb 1 flight of stairs with single hand rail with step over step pattern with pain </= 3/10.    Time 8    Period Weeks    Status New    Target Date 11/30/21      PT LONG TERM GOAL #4   Title pt will improve his right knee AROM to >/= 120 degrees in order to improve funcitonal mobility with pain </= 3/10.    Time 8    Period Weeks    Status New    Target Date 11/30/21      PT LONG TERM GOAL #5   Title Pt will improve his FOTO to >/= 53 %    Baseline 31% on 10/03/2021    Time 8    Period Weeks    Status New    Target Date 11/30/21                   Plan - 10/11/21 0955     Clinical Impression Statement Pt demonstrating safe mobility with SPC today for indoor surfaces, and feel he can transition to cane at home and short community distances.  ROM slowly improving with continued focus on maximizing ROM early as well as strengthening exercises.  Will continue to benefit from PT to maximize function.    Personal Factors and Comorbidities Comorbidity 3+    Comorbidities right knee arthroscopy 1990, lumbar disc surgery 1970    Examination-Activity Limitations Lift;Transfers;Squat;Stairs;Stand;Dressing;Sit    Examination-Participation Restrictions Community Activity;Other;Yard Work    Stability/Clinical Decision Making Stable/Uncomplicated    Rehab Potential Good    PT Frequency 2x / week    PT Duration 8 weeks    PT Treatment/Interventions ADLs/Self Care Home Management;Vasopneumatic Device;Taping;Cryotherapy;Counsellor;Therapeutic exercise;Therapeutic activities;Functional mobility training;Stair training;Gait training;Neuromuscular re-education;Patient/family education;Passive range of motion;Scar mobilization;Manual techniques    PT Next Visit Plan Emphasis on AROM,  quadriceps strength, edema control; continue with balance/gait training PRN    PT Home Exercise Plan Access Code: [email or web address]    Consulted and Agree with Plan of  Care Patient             Patient will benefit from skilled therapeutic intervention in order to improve the following deficits and impairments:  Pain, Difficulty walking, Decreased activity tolerance, Decreased balance, Decreased mobility, Decreased strength, Increased edema, Impaired flexibility  Visit Diagnosis: Difficulty in walking, not elsewhere classified  Muscle weakness (generalized)  Localized edema  Stiffness of right knee, not elsewhere classified  Acute pain of right knee     Problem List Patient Active Problem List   Diagnosis Date Noted   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   Pre-op examination 09/10/2021   Cough 09/10/2021   Primary osteoarthritis of left knee 08/07/2021   Primary osteoarthritis of right knee 08/07/2021   Sensory loss 02/13/2016   Preventative health care 02/13/2016   Advance directive discussed with patient 02/13/2016   Generalized osteoarthritis of multiple sites       Laureen Abrahams, PT, DPT 10/11/21 9:57 AM     [facility] [address] [place], [place], [address] Phone: [phone]   Fax:  [phone]  Name: Bryan Thomas MRN: [id number] Date of Birth: 1941/04/26

## 2021-10-16 ENCOUNTER — Other Ambulatory Visit: Payer: Self-pay

## 2021-10-16 ENCOUNTER — Encounter: Payer: Self-pay | Admitting: Physical Therapy

## 2021-10-16 ENCOUNTER — Ambulatory Visit: Payer: PPO | Admitting: Physical Therapy

## 2021-10-16 DIAGNOSIS — R262 Difficulty in walking, not elsewhere classified: Secondary | ICD-10-CM

## 2021-10-16 DIAGNOSIS — M25561 Pain in right knee: Secondary | ICD-10-CM

## 2021-10-16 DIAGNOSIS — R600 Localized edema: Secondary | ICD-10-CM

## 2021-10-16 DIAGNOSIS — M6281 Muscle weakness (generalized): Secondary | ICD-10-CM

## 2021-10-16 DIAGNOSIS — M25661 Stiffness of right knee, not elsewhere classified: Secondary | ICD-10-CM

## 2021-10-16 NOTE — Therapy (Signed)
[place], [place], [address] Phone: [phone]   Fax:  [phone]  Physical Therapy Treatment  Patient Details  Name: Bryan Thomas MRN: [id number] Date of Birth: 06/07/1941 Referring Provider (PT): Frankey Shown MD   Encounter Date: 10/16/2021   PT End of Session - 10/16/21 1552     Visit Number 5    Number of Visits 17    Date for PT Re-Evaluation 11/30/21    PT Start Time 1324    PT Stop Time 1600    PT Time Calculation (min) 45 min    Activity Tolerance Patient tolerated treatment well             Past Medical History:  Diagnosis Date   Generalized osteoarthritis of multiple sites     Past Surgical History:  Procedure Laterality Date   KNEE ARTHROSCOPY Right 06/21/1989   LUMBAR [place] SURGERY  06/21/1969   TONSILLECTOMY     removed as a teenager   TOTAL KNEE ARTHROPLASTY Right 09/17/2021   Procedure: RIGHT TOTAL KNEE ARTHROPLASTY;  Surgeon: Leandrew Koyanagi, MD;  Location: [facility];  Service: Orthopedics;  Laterality: Right;    There were no vitals filed for this visit.   Subjective Assessment - 10/16/21 1548     Subjective Pt arriving reporting 6/10 pain in his right knee. Pt amb with straight cane into clinic.    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 Yes    Pain Score 6     Pain Location Knee    Pain Orientation Right    Pain Descriptors / Indicators Aching;Sore;Tightness    Pain Type Surgical pain    Pain Onset 1 to 4 weeks ago                [facility] PT Assessment - 10/16/21 0001       Assessment   Medical Diagnosis right TKA M17.11    Referring Provider (PT) Frankey Shown MD      PROM   Right Knee Flexion 96                           [facility] Adult PT Treatment/Exercise - 10/16/21 0001       Knee/Hip Exercises: Aerobic    Stationary Bike seat 9 beginning with partial revolutions progressing to full   x 8 minutes     Knee/Hip Exercises: Machines for Strengthening   Cybex Leg Press 75# bilateral LE's 2x10, Rt LE only 37# x10      Knee/Hip Exercises: Seated   Long Arc Quad Right;10 reps;3 sets;Weights    Long Arc Quad Weight 5 lbs.    Long Arc Quad Limitations 5 sec hold      Knee/Hip Exercises: Supine   Heel Slides AAROM;Right;10 reps      Modalities   Modalities Vasopneumatic      Vasopneumatic   Number Minutes Vasopneumatic  10 minutes    Vasopnuematic Location  Knee    Vasopneumatic Pressure Medium    Vasopneumatic Temperature  34      Manual Therapy   Manual therapy comments PROM knee flexion with overpressure with extension.                       PT Short Term Goals - 10/16/21  [place] #1   Title Pt will be independent in his initial HEP    Status On-going      PT SHORT TERM GOAL #2   Title Pt will improve 5 time sit to stand to </= 20 seconds with UE support    Status On-going               PT Long Term Goals - 10/03/21 1155       PT LONG TERM GOAL #1   Title Pt will be indepdent in his advanced HEP    Time 8    Period Weeks    Status New    Target Date 11/30/21      PT LONG TERM GOAL #2   Title Pt will be able to amb with no device on level surfaces for 1000 feet using step through gait pattern safely.    Baseline amb level surfaces with rolling walker    Time 8    Period Weeks    Status New    Target Date 11/30/21      PT LONG TERM GOAL #3   Title Pt will be able to climb 1 flight of stairs with single hand rail with step over step pattern with pain </= 3/10.    Time 8    Period Weeks    Status New    Target Date 11/30/21      PT LONG TERM GOAL #4   Title pt will improve his right knee AROM to >/= 120 degrees in order to improve funcitonal mobility with pain </= 3/10.    Time 8    Period Weeks    Status New    Target  Date 11/30/21      PT LONG TERM GOAL #5   Title Pt will improve his FOTO to >/= 53 %    Baseline 31% on 10/03/2021    Time 8    Period Weeks    Status New    Target Date 11/30/21                   Plan - 10/16/21 1552     Clinical Impression Statement Pt arriving today reproting 6/10 pain in right knee. Pt amb with straight cane with antalgic gait pattern. Pt tolerating exercises with mild increased pain when performing leg press. Pt's AROM in right knee flexion has improved to 98 degrees. Continue skilled PT to maximize function and prorgress with overall strength, gait and balance.    Personal Factors and Comorbidities Comorbidity 3+    Comorbidities right knee arthroscopy 1990, lumbar disc surgery 1970    Examination-Activity Limitations Lift;Transfers;Squat;Stairs;Stand;Dressing;Sit    Examination-Participation Restrictions Community Activity;Other;Yard Work    Stability/Clinical Decision Making Stable/Uncomplicated    Rehab Potential Good    PT Frequency 2x / week    PT Duration 8 weeks    PT Treatment/Interventions ADLs/Self Care Home Management;Vasopneumatic Device;Taping;Cryotherapy;Counsellor;Therapeutic exercise;Therapeutic activities;Functional mobility training;Stair training;Gait training;Neuromuscular re-education;Patient/family education;Passive range of motion;Scar mobilization;Manual techniques    PT Next Visit Plan Emphasis on AROM, quadriceps strength, continue with balance/gait training PRN, vasopneumatic as needed    PT Home Exercise Plan Access Code: [email or web address]    Consulted and Agree with Plan of Care Patient             Patient will benefit from skilled therapeutic intervention in order to improve the following deficits and impairments:  Pain, Difficulty walking, Decreased activity tolerance,  Decreased balance, Decreased mobility, Decreased strength, Increased edema, Impaired flexibility  Visit  Diagnosis: Difficulty in walking, not elsewhere classified  Muscle weakness (generalized)  Localized edema  Stiffness of right knee, not elsewhere classified  Acute pain of right knee     Problem List Patient Active Problem List   Diagnosis Date Noted   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   Pre-op examination 09/10/2021   Cough 09/10/2021   Primary osteoarthritis of left knee 08/07/2021   Primary osteoarthritis of right knee 08/07/2021   Sensory loss 02/13/2016   Preventative health care 02/13/2016   Advance directive discussed with patient 02/13/2016   Generalized osteoarthritis of multiple sites     Oretha Caprice, PT,MPT 10/16/2021, 4:09 PM  [facility] [address] [place], [place], [address] Phone: [phone]   Fax:  [phone]  Name: Bryan Thomas MRN: [id number] Date of Birth: 1941-07-12

## 2021-10-17 ENCOUNTER — Ambulatory Visit: Payer: PPO | Admitting: Physical Therapy

## 2021-10-17 DIAGNOSIS — M25661 Stiffness of right knee, not elsewhere classified: Secondary | ICD-10-CM | POA: Diagnosis not present

## 2021-10-17 DIAGNOSIS — R262 Difficulty in walking, not elsewhere classified: Secondary | ICD-10-CM | POA: Diagnosis not present

## 2021-10-17 DIAGNOSIS — M6281 Muscle weakness (generalized): Secondary | ICD-10-CM

## 2021-10-17 DIAGNOSIS — R600 Localized edema: Secondary | ICD-10-CM | POA: Diagnosis not present

## 2021-10-17 NOTE — Therapy (Signed)
[facility] [place], [place], [address] Phone: [phone]   Fax:  [phone]  Physical Therapy Treatment  Patient Details  Name: Bryan Thomas MRN: [id number] Date of Birth: May 20, 1941 Referring Provider (PT): Frankey Shown MD   Encounter Date: 10/17/2021   PT End of Session - 10/17/21 1356     Visit Number 6    Number of Visits 17    Date for PT Re-Evaluation 11/30/21    PT Start Time 5597    PT Stop Time 1430    PT Time Calculation (min) 45 min    Activity Tolerance Patient tolerated treatment well             Past Medical History:  Diagnosis Date   Generalized osteoarthritis of multiple sites     Past Surgical History:  Procedure Laterality Date   KNEE ARTHROSCOPY Right 06/21/1989   LUMBAR [place] SURGERY  06/21/1969   TONSILLECTOMY     removed as a teenager   TOTAL KNEE ARTHROPLASTY Right 09/17/2021   Procedure: RIGHT TOTAL KNEE ARTHROPLASTY;  Surgeon: Leandrew Koyanagi, MD;  Location: [facility];  Service: Orthopedics;  Laterality: Right;    There were no vitals filed for this visit.   Subjective Assessment - 10/17/21 1355     Subjective relays he is not good at giving pain levels so states that his knee hurts like hell but it is getting better    Pertinent History PMH:  right knee arthroscopy 1990, lumbar disc surgery 1970    Limitations House hold activities;Walking    Patient Stated Goals Walk without walker, stop hurting, work in shop again    Pain Onset 1 to 4 weeks ago                [facility] PT Assessment - 10/17/21 0001       Assessment   Medical Diagnosis right TKA M17.11    Referring Provider (PT) Frankey Shown MD      AROM   Right Knee Extension -8    Right Knee Flexion 100      PROM   Right Knee Extension -5    Right Knee Flexion 103                           [facility] Adult PT Treatment/Exercise - 10/17/21 0001       Ambulation/Gait   Gait Comments walks in with [facility]  but can ambulate short distances 50-75 feet now without SPC and supervision      Knee/Hip Exercises: Stretches   Knee: Self-Stretch Limitations Rt 10 x 10 sec hold; LLE overpressure    Gastroc Stretch Both;3 reps;30 seconds    Other Knee/Hip Stretches supine heel prop 4 min at beginning of vaso      Knee/Hip Exercises: Aerobic   Stationary Bike seat 9 full revolutions 8 min      Knee/Hip Exercises: Seated   Long Arc Quad Right;10 reps;3 sets;Weights    Long Arc Quad Weight 5 lbs.    Long Arc Quad Limitations 5 sec hold    Sit to [employer] with UE support;10 reps      Vasopneumatic   Number Minutes Vasopneumatic  10 minutes    Vasopnuematic Location  Knee    Vasopneumatic Pressure Medium    Vasopneumatic Temperature  34      Manual Therapy   Manual therapy comments Rt knee PROM flexion and extension, manual hamstring stretching  PT Short Term Goals - 10/16/21 1608       PT SHORT TERM GOAL #1   Title Pt will be independent in his initial HEP    Status On-going      PT SHORT TERM GOAL #2   Title Pt will improve 5 time sit to stand to </= 20 seconds with UE support    Status On-going               PT Long Term Goals - 10/03/21 1155       PT LONG TERM GOAL #1   Title Pt will be indepdent in his advanced HEP    Time 8    Period Weeks    Status New    Target Date 11/30/21      PT LONG TERM GOAL #2   Title Pt will be able to amb with no device on level surfaces for 1000 feet using step through gait pattern safely.    Baseline amb level surfaces with rolling walker    Time 8    Period Weeks    Status New    Target Date 11/30/21      PT LONG TERM GOAL #3   Title Pt will be able to climb 1 flight of stairs with single hand rail with step over step pattern with pain </= 3/10.    Time 8    Period Weeks    Status New    Target Date 11/30/21      PT LONG TERM GOAL #4   Title pt will improve his right knee AROM to >/= 120 degrees in  order to improve funcitonal mobility with pain </= 3/10.    Time 8    Period Weeks    Status New    Target Date 11/30/21      PT LONG TERM GOAL #5   Title Pt will improve his FOTO to >/= 53 %    Baseline 31% on 10/03/2021    Time 8    Period Weeks    Status New    Target Date 11/30/21                   Plan - 10/17/21 1423     Clinical Impression Statement He showed improved Rt knee ROM measurments today but still with limitations. We continued to work on this along with strengthening to his tolerance. He does still have edema in his Rt kene so used Vasopneumatic for this. Continue POC.    Personal Factors and Comorbidities Comorbidity 3+    Comorbidities right knee arthroscopy 1990, lumbar disc surgery 1970    Examination-Activity Limitations Lift;Transfers;Squat;Stairs;Stand;Dressing;Sit    Examination-Participation Restrictions Community Activity;Other;Yard Work    Stability/Clinical Decision Making Stable/Uncomplicated    Rehab Potential Good    PT Frequency 2x / week    PT Duration 8 weeks    PT Treatment/Interventions ADLs/Self Care Home Management;Vasopneumatic Device;Taping;Cryotherapy;Counsellor;Therapeutic exercise;Therapeutic activities;Functional mobility training;Stair training;Gait training;Neuromuscular re-education;Patient/family education;Passive range of motion;Scar mobilization;Manual techniques    PT Next Visit Plan Emphasis on AROM, quadriceps strength, continue with balance/gait training PRN, vasopneumatic as needed    PT Home Exercise Plan Access Code: [email or web address]    Consulted and Agree with Plan of Care Patient             Patient will benefit from skilled therapeutic intervention in order to improve the following deficits and impairments:  Pain, Difficulty walking, Decreased activity tolerance, Decreased balance, Decreased mobility, Decreased strength, Increased edema,  Impaired flexibility  Visit  Diagnosis: Difficulty in walking, not elsewhere classified  Muscle weakness (generalized)  Localized edema  Stiffness of right knee, not elsewhere classified     Problem List Patient Active Problem List   Diagnosis Date Noted   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   Pre-op examination 09/10/2021   Cough 09/10/2021   Primary osteoarthritis of left knee 08/07/2021   Primary osteoarthritis of right knee 08/07/2021   Sensory loss 02/13/2016   Preventative health care 02/13/2016   Advance directive discussed with patient 02/13/2016   Generalized osteoarthritis of multiple sites     Debbe Odea, PT,DPT 10/17/2021, 2:24 PM  [facility] Physical Therapy [address] [place], [place], [address] Phone: [phone]   Fax:  [phone]  Name: Bryan Thomas MRN: [id number] Date of Birth: Feb 22, 1941

## 2021-10-23 ENCOUNTER — Encounter: Payer: Self-pay | Admitting: Internal Medicine

## 2021-10-24 ENCOUNTER — Emergency Department (HOSPITAL_COMMUNITY): Payer: PPO

## 2021-10-24 ENCOUNTER — Other Ambulatory Visit: Payer: Self-pay

## 2021-10-24 ENCOUNTER — Ambulatory Visit
Admission: EM | Admit: 2021-10-24 | Discharge: 2021-10-24 | Disposition: A | Discharge status: Home / Self Care | Payer: PPO

## 2021-10-24 ENCOUNTER — Encounter (HOSPITAL_COMMUNITY): Payer: Self-pay

## 2021-10-24 ENCOUNTER — Emergency Department (HOSPITAL_COMMUNITY)
Admission: EM | Admit: 2021-10-24 | Discharge: 2021-10-25 | Disposition: A | Discharge status: Home / Self Care | Payer: PPO | Attending: Emergency Medicine | Admitting: Emergency Medicine

## 2021-10-24 ENCOUNTER — Encounter: Payer: PPO | Admitting: Rehabilitative and Restorative Service Providers"

## 2021-10-24 DIAGNOSIS — Z7982 Long term (current) use of aspirin: Secondary | ICD-10-CM | POA: Insufficient documentation

## 2021-10-24 DIAGNOSIS — D72829 Elevated white blood cell count, unspecified: Secondary | ICD-10-CM | POA: Diagnosis not present

## 2021-10-24 DIAGNOSIS — K449 Diaphragmatic hernia without obstruction or gangrene: Secondary | ICD-10-CM | POA: Diagnosis not present

## 2021-10-24 DIAGNOSIS — K4090 Unilateral inguinal hernia, without obstruction or gangrene, not specified as recurrent: Secondary | ICD-10-CM | POA: Diagnosis not present

## 2021-10-24 DIAGNOSIS — I2510 Atherosclerotic heart disease of native coronary artery without angina pectoris: Secondary | ICD-10-CM | POA: Diagnosis not present

## 2021-10-24 DIAGNOSIS — R1030 Lower abdominal pain, unspecified: Secondary | ICD-10-CM | POA: Diagnosis not present

## 2021-10-24 DIAGNOSIS — R1031 Right lower quadrant pain: Secondary | ICD-10-CM | POA: Diagnosis present

## 2021-10-24 DIAGNOSIS — R0789 Other chest pain: Secondary | ICD-10-CM | POA: Insufficient documentation

## 2021-10-24 DIAGNOSIS — D1800 Hemangioma unspecified site: Secondary | ICD-10-CM | POA: Diagnosis not present

## 2021-10-24 DIAGNOSIS — R109 Unspecified abdominal pain: Secondary | ICD-10-CM

## 2021-10-24 DIAGNOSIS — R42 Dizziness and giddiness: Secondary | ICD-10-CM | POA: Diagnosis not present

## 2021-10-24 LAB — CBC WITH DIFFERENTIAL/PLATELET
Abs Immature Granulocytes: 0.03 10*3/uL (ref 0.00–0.07)
Basophils Absolute: 0 10*3/uL (ref 0.0–0.1)
Basophils Relative: 0 %
Eosinophils Absolute: 0.4 10*3/uL (ref 0.0–0.5)
Eosinophils Relative: 3 %
HCT: 45.7 % (ref 39.0–52.0)
Hemoglobin: 14.6 g/dL (ref 13.0–17.0)
Immature Granulocytes: 0 %
Lymphocytes Relative: 30 %
Lymphs Abs: 3.6 10*3/uL (ref 0.7–4.0)
MCH: 31.1 pg (ref 26.0–34.0)
MCHC: 31.9 g/dL (ref 30.0–36.0)
MCV: 97.4 fL (ref 80.0–100.0)
Monocytes Absolute: 1.1 10*3/uL — ABNORMAL HIGH (ref 0.1–1.0)
Monocytes Relative: 9 %
Neutro Abs: 7 10*3/uL (ref 1.7–7.7)
Neutrophils Relative %: 58 %
Platelets: 331 10*3/uL (ref 150–400)
RBC: 4.69 MIL/uL (ref 4.22–5.81)
RDW: 13.2 % (ref 11.5–15.5)
WBC: 12 10*3/uL — ABNORMAL HIGH (ref 4.0–10.5)
nRBC: 0 % (ref 0.0–0.2)

## 2021-10-24 LAB — COMPREHENSIVE METABOLIC PANEL
ALT: 25 U/L (ref 0–44)
AST: 23 U/L (ref 15–41)
Albumin: 3.6 g/dL (ref 3.5–5.0)
Alkaline Phosphatase: 128 U/L — ABNORMAL HIGH (ref 38–126)
Anion gap: 7 (ref 5–15)
BUN: 16 mg/dL (ref 8–23)
CO2: 27 mmol/L (ref 22–32)
Calcium: 9.2 mg/dL (ref 8.9–10.3)
Chloride: 102 mmol/L (ref 98–111)
Creatinine, Ser: 0.83 mg/dL (ref 0.61–1.24)
GFR, Estimated: >60 mL/min (ref >60)
Glucose, Bld: 122 mg/dL — ABNORMAL HIGH (ref 70–99)
Potassium: 4.5 mmol/L (ref 3.5–5.1)
Sodium: 136 mmol/L (ref 135–145)
Total Bilirubin: 1.1 mg/dL (ref 0.3–1.2)
Total Protein: 7.7 g/dL (ref 6.5–8.1)

## 2021-10-24 LAB — I-STAT CHEM 8, ED
BUN: 21 mg/dL (ref 8–23)
Calcium, Ion: 1.17 mmol/L (ref 1.15–1.40)
Chloride: 101 mmol/L (ref 98–111)
Creatinine, Ser: 0.7 mg/dL (ref 0.61–1.24)
Glucose, Bld: 111 mg/dL — ABNORMAL HIGH (ref 70–99)
HCT: 46 % (ref 39.0–52.0)
Hemoglobin: 15.6 g/dL (ref 13.0–17.0)
Potassium: 4.5 mmol/L (ref 3.5–5.1)
Sodium: 137 mmol/L (ref 135–145)
TCO2: 29 mmol/L (ref 22–32)

## 2021-10-24 LAB — TROPONIN I (HIGH SENSITIVITY): Troponin I (High Sensitivity): 7 ng/L (ref <18)

## 2021-10-24 MED ORDER — ONDANSETRON HCL 4 MG/2ML IJ SOLN
4.0000 mg | Freq: Once | INTRAMUSCULAR | Status: AC
  Administered 2021-10-24: 4 mg via INTRAVENOUS
  Filled 2021-10-24: qty 2

## 2021-10-24 MED ORDER — IOHEXOL 350 MG/ML SOLN
100.0000 mL | Freq: Once | INTRAVENOUS | Status: AC | PRN
  Administered 2021-10-24: 100 mL via INTRAVENOUS

## 2021-10-24 MED ORDER — HYDROMORPHONE HCL 1 MG/ML IJ SOLN
1.0000 mg | Freq: Once | INTRAMUSCULAR | Status: AC
  Administered 2021-10-24: 1 mg via INTRAVENOUS
  Filled 2021-10-24: qty 1

## 2021-10-24 NOTE — Discharge Instructions (Signed)
Please go to the hospital as soon as you leave urgent care for further evaluation and management. 

## 2021-10-24 NOTE — ED Triage Notes (Signed)
Pt c/o "hurting in my gut" "if I take a deep breath it feels like a knife going in me." First noticed about Sunday. Denies constipation LBM last night states pain is constant regardless of eating.

## 2021-10-24 NOTE — Telephone Encounter (Signed)
Spoke to pt's daughter. We discussed that since he is not getting better, he really should go to an UC or ER today to be evaluated., If he does not want to, we have made him an appt here with Dr Silvio Pate on Thursday, Jan 5 at 145.

## 2021-10-24 NOTE — ED Triage Notes (Signed)
Patient complains of severe abd pain that started 3 days ago.  Denies n/v/d

## 2021-10-24 NOTE — ED Provider Triage Note (Signed)
Emergency Medicine Provider Triage Evaluation Note  Bryan Thomas , a 81 y.o. male  was evaluated in triage.  Pt complains of severe abdominal pain   Review of Systems  Positive: Abdominal pain  Negative: fever  Physical Exam  BP (!) 141/90 (BP Location: Left Arm)    Pulse 82    Temp (!) 97.4 F (36.3 C) (Oral)    Resp (!) 22    Ht 6\' 2"  (1.88 m)    Wt 113.4 kg    SpO2 95%    BMI 32.10 kg/m  Gen:   Awake, no distress   Resp:  Normal effort  MSK:   Moves extremities without difficulty  Other:    Medical Decision Making  Medically screening exam initiated at 4:52 PM.  Appropriate orders placed.  STOCKTON NUNLEY was informed that the remainder of the evaluation will be completed by another provider, this initial triage assessment does not replace that evaluation, and the importance of remaining in the ED until their evaluation is complete.  Dr. Laverta Baltimore in to see.  Ultrasound abdomen at bedside     Fransico Meadow, [place] 10/24/21 1653

## 2021-10-24 NOTE — ED Notes (Signed)
EKG done in triage; seen by Dr. Laverta Baltimore. EKG did not transfer into pt chart.

## 2021-10-24 NOTE — ED Provider Notes (Signed)
[facility]    CSN: [id number] Arrival date & time: 10/24/21  1325      History   Chief Complaint Chief Complaint  Patient presents with   Abdominal Pain    HPI Bryan Thomas is a 81 y.o. male.   Patient presents with severe lower abdominal pain that started approximately 3 days ago.  Pain is constant and is described as "achy".  He reports that sharp pain in the left upper quadrant occurs when taking deep breaths as well.  Majority of pain is located across the lower abdomen.  Patient is not able to enumerate level of pain but reports that it is severe.  Denies fever, nausea, vomiting, diarrhea, constipation.  Patient is still having regular bowel movements.  Denies any blood in stool.  Patient recently had knee surgery and has been taking tramadol for pain that he stopped taking approximately 3 to 4 days ago.   Abdominal Pain  Past Medical History:  Diagnosis Date   Generalized osteoarthritis of multiple sites     Patient Active Problem List   Diagnosis Date Noted   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   Pre-op examination 09/10/2021   Cough 09/10/2021   Primary osteoarthritis of left knee 08/07/2021   Primary osteoarthritis of right knee 08/07/2021   Sensory loss 02/13/2016   Preventative health care 02/13/2016   Advance directive discussed with patient 02/13/2016   Generalized osteoarthritis of multiple sites     Past Surgical History:  Procedure Laterality Date   KNEE ARTHROSCOPY Right 06/21/1989   LUMBAR [place] SURGERY  06/21/1969   TONSILLECTOMY     removed as a teenager   TOTAL KNEE ARTHROPLASTY Right 09/17/2021   Procedure: RIGHT TOTAL KNEE ARTHROPLASTY;  Surgeon: Leandrew Koyanagi, MD;  Location: [place];  Service: Orthopedics;  Laterality: Right;       Home Medications    Prior to Admission medications   Medication Sig Start Date End Date Taking? Authorizing Provider  aspirin EC 81 MG tablet Take 1 tablet (81 mg total) by  mouth 2 (two) times daily. To be taken after surgery 09/12/21   Aundra Dubin, PA-C  docusate sodium (COLACE) 100 MG capsule Take 1 capsule (100 mg total) by mouth daily as needed. 09/12/21 09/12/22  Aundra Dubin, PA-C  methocarbamol (ROBAXIN) 500 MG tablet Take 1 tablet (500 mg total) by mouth 2 (two) times daily as needed. To be taken after surgery 09/12/21   Aundra Dubin, PA-C  ondansetron (ZOFRAN) 4 MG tablet Take 1 tablet (4 mg total) by mouth every 8 (eight) hours as needed for nausea or vomiting. 09/12/21   Aundra Dubin, PA-C  traMADol (ULTRAM) 50 MG tablet Take 1-2 tablets (50-100 mg total) by mouth 3 (three) times daily as needed. 10/09/21   Aundra Dubin, PA-C    Family History Family History  Problem Relation Age of Onset   Cancer Mother    Diabetes Sister    Diabetes Brother    Heart disease Brother        CABG   Diabetes Brother    Diabetes Brother     Social History Social History   Tobacco Use   Smoking status: Former   Smokeless tobacco: Former    Types: Chew    Quit date: 08/21/2021  Vaping Use   Vaping Use: Never used  Substance Use Topics   Alcohol use: No    Alcohol/week: 0.0 standard drinks   Drug use: Never  Allergies   Oxycodone   Review of Systems Review of Systems Per HPI  Physical Exam Triage Vital Signs ED Triage Vitals  Enc Vitals Group     BP 10/24/21 1507 136/76     Pulse Rate 10/24/21 1507 81     Resp 10/24/21 1507 18     Temp 10/24/21 1507 98.3 F (36.8 C)     Temp Source 10/24/21 1507 Oral     SpO2 10/24/21 1507 97 %     Weight --      Height --      Head Circumference --      Peak Flow --      Pain Score 10/24/21 1508 0     Pain Loc --      Pain Edu? --      Excl. in [place]? --    No data found.  Updated Vital Signs BP 136/76 (BP Location: Left Arm)    Pulse 81    Temp 98.3 F (36.8 C) (Oral)    Resp 18    SpO2 97%   Visual Acuity Right Eye Distance:   Left Eye Distance:   Bilateral Distance:     Right Eye Near:   Left Eye Near:    Bilateral Near:     Physical Exam Constitutional:      General: He is not in acute distress.    Appearance: Normal appearance. He is not toxic-appearing or diaphoretic.     Comments: Patient sitting in a tripod position during physical exam due to pain.  HENT:     Head: Normocephalic and atraumatic.  Eyes:     Extraocular Movements: Extraocular movements intact.     Conjunctiva/sclera: Conjunctivae normal.  Cardiovascular:     Rate and Rhythm: Normal rate and regular rhythm.     Pulses: Normal pulses.     Heart sounds: Normal heart sounds.  Pulmonary:     Effort: Pulmonary effort is normal. No respiratory distress.     Breath sounds: Normal breath sounds.  Abdominal:     General: Bowel sounds are normal. There is no distension.     Palpations: Abdomen is soft.     Tenderness: There is abdominal tenderness in the right lower quadrant and left lower quadrant.  Neurological:     General: No focal deficit present.     Mental Status: He is alert and oriented to person, place, and time. Mental status is at baseline.  Psychiatric:        Mood and Affect: Mood normal.        Behavior: Behavior normal.        Thought Content: Thought content normal.        Judgment: Judgment normal.     UC Treatments / Results  Labs (all labs ordered are listed, but only abnormal results are displayed) Labs Reviewed - No data to display  EKG   Radiology No results found.  Procedures Procedures (including critical care time)  Medications Ordered in UC Medications - No data to display  Initial Impression / Assessment and Plan / UC Course  I have reviewed the triage vital signs and the nursing notes.  Pertinent labs & imaging results that were available during my care of the patient were reviewed by me and considered in my medical decision making (see chart for details).     Patient will need more extensive evaluation and management at the  hospital as he may need CT imaging of the abdomen to determine cause of  patient's pain.  Unable to provide CT scan in urgent care.  Patient was advised to go to the hospital for further evaluation and management.  Vital signs stable at discharge.  Agree with patient's daughter transporting him to the hospital. Final Clinical Impressions(s) / UC Diagnoses   Final diagnoses:  Continuous severe abdominal pain     Discharge Instructions      Please go to the hospital as soon as you leave urgent care for further evaluation and management.    ED Prescriptions   None    PDMP not reviewed this encounter.   Teodora Medici, [place] 10/24/21 [phone]

## 2021-10-25 ENCOUNTER — Encounter: Payer: Self-pay | Admitting: Internal Medicine

## 2021-10-25 ENCOUNTER — Ambulatory Visit: Payer: PPO | Admitting: Internal Medicine

## 2021-10-25 ENCOUNTER — Encounter: Payer: PPO | Admitting: Physical Therapy

## 2021-10-25 LAB — TROPONIN I (HIGH SENSITIVITY): Troponin I (High Sensitivity): 6 ng/L (ref <18)

## 2021-10-25 MED ORDER — TRAMADOL HCL 50 MG PO TABS
50.0000 mg | ORAL_TABLET | Freq: Four times a day (QID) | ORAL | 0 refills | Status: DC | PRN
Start: 2021-10-25 — End: 2022-01-18

## 2021-10-25 MED ORDER — BENZONATATE 100 MG PO CAPS: 100.0000 mg | ORAL_CAPSULE | Freq: Three times a day (TID) | ORAL | 0 refills | Status: DC

## 2021-10-25 MED ORDER — POLYETHYLENE GLYCOL 3350 17 GM/SCOOP PO POWD: 1.0000 | Freq: Three times a day (TID) | ORAL | 0 refills | Status: DC

## 2021-10-25 NOTE — ED Provider Notes (Signed)
[facility] EMERGENCY DEPARTMENT Provider Note   CSN: [id number] Arrival date & time: 10/24/21  1549     History  Chief Complaint  Patient presents with   Abdominal Pain    Bryan Thomas is a 81 y.o. male.   Abdominal Pain Associated symptoms: no chest pain, no chills, no cough, no diarrhea, no dysuria, no fever, no nausea, no shortness of breath and no vomiting    Patient is an 81 year old male with past medical history significant for chronic cough presented to emergency room today with complaints of bilateral lower abdominal pain ongoing for 3 to 4 days.  No nausea vomiting diarrhea chest pain difficulty breathing.  Stool having normal bowel movements with no blood or pain.  He states that his symptoms are worse with coughing.  He denies any fevers chills and states he is still passing gas without difficulty.  He denies any history of abdominal surgeries.  Denies any urinary frequency urgency dysuria or hematuria.    Home Medications Prior to Admission medications   Medication Sig Start Date End Date Taking? Authorizing Provider  aspirin EC 81 MG tablet Take 1 tablet (81 mg total) by mouth 2 (two) times daily. To be taken after surgery Patient taking differently: Take 81 mg by mouth 2 (two) times daily. 09/12/21  Yes Aundra Dubin, PA-C  benzonatate (TESSALON) 100 MG capsule Take 1 capsule (100 mg total) by mouth every 8 (eight) hours. 10/25/21  Yes Natividad Schlosser S, PA  docusate sodium (COLACE) 100 MG capsule Take 1 capsule (100 mg total) by mouth daily as needed. Patient taking differently: Take 100 mg by mouth daily as needed for mild constipation. 09/12/21 09/12/22 Yes Aundra Dubin, PA-C  methocarbamol (ROBAXIN) 500 MG tablet Take 1 tablet (500 mg total) by mouth 2 (two) times daily as needed. To be taken after surgery Patient taking differently: Take 500 mg by mouth 2 (two) times daily as needed for muscle spasms. 09/12/21  Yes Aundra Dubin,  PA-C  ondansetron (ZOFRAN) 4 MG tablet Take 1 tablet (4 mg total) by mouth every 8 (eight) hours as needed for nausea or vomiting. 09/12/21  Yes Aundra Dubin, PA-C  polyethylene glycol powder (GLYCOLAX/MIRALAX) 17 GM/SCOOP powder Take 255 g by mouth in the morning, at noon, and at bedtime. One scoop in beverage of your choice with each meal. You may increase if you continue to be constipated and decrease if your stool becomes too loose. 10/25/21  Yes Lavern Crimi S, PA  traMADol (ULTRAM) 50 MG tablet Take 1 tablet (50 mg total) by mouth every 6 (six) hours as needed. 10/25/21  Yes Pati Gallo S, PA      Allergies    Oxycodone    Review of Systems   Review of Systems  Constitutional:  Negative for chills and fever.  HENT:  Negative for congestion.   Eyes:  Negative for pain.  Respiratory:  Negative for cough and shortness of breath.   Cardiovascular:  Negative for chest pain and leg swelling.  Gastrointestinal:  Positive for abdominal pain. Negative for diarrhea, nausea and vomiting.  Genitourinary:  Negative for dysuria.  Musculoskeletal:  Negative for myalgias.  Skin:  Negative for rash.  Neurological:  Negative for dizziness and headaches.   Physical Exam Updated Vital Signs BP 128/76 (BP Location: Right Arm)    Pulse (!) 101    Temp 97.8 F (36.6 C) (Oral)    Resp 20    Ht 6\' 2"  (  1.88 m)    Wt 113.4 kg    SpO2 98%    BMI 32.10 kg/m  Physical Exam Vitals and nursing note reviewed.  Constitutional:      General: He is not in acute distress.    Appearance: He is obese.  HENT:     Head: Normocephalic and atraumatic.     Nose: Nose normal.  Eyes:     General: No scleral icterus. Cardiovascular:     Rate and Rhythm: Normal rate and regular rhythm.     Pulses: Normal pulses.     Heart sounds: Normal heart sounds.  Pulmonary:     Effort: Pulmonary effort is normal. No respiratory distress.     Breath sounds: No wheezing.  Abdominal:     Palpations: Abdomen is soft.      Tenderness: There is abdominal tenderness.     Comments: There is tenderness to palpation over an area of swelling/small palpable hernia in the left lower abdomen/inguinal region.  Musculoskeletal:     Cervical back: Normal range of motion.     Right lower leg: No edema.     Left lower leg: No edema.  Skin:    General: Skin is warm and dry.     Capillary Refill: Capillary refill takes less than 2 seconds.  Neurological:     Mental Status: He is alert. Mental status is at baseline.  Psychiatric:        Mood and Affect: Mood normal.        Behavior: Behavior normal.    ED Results / Procedures / Treatments   Labs (all labs ordered are listed, but only abnormal results are displayed) Labs Reviewed  CBC WITH DIFFERENTIAL/PLATELET - Abnormal; Notable for the following components:      Result Value   WBC 12.0 (*)    Monocytes Absolute 1.1 (*)    All other components within normal limits  COMPREHENSIVE METABOLIC PANEL - Abnormal; Notable for the following components:   Glucose, Bld 122 (*)    Alkaline Phosphatase 128 (*)    All other components within normal limits  I-STAT CHEM 8, ED - Abnormal; Notable for the following components:   Glucose, Bld 111 (*)    All other components within normal limits  TROPONIN I (HIGH SENSITIVITY)  TROPONIN I (HIGH SENSITIVITY)    EKG None  Radiology CT Angio Chest/Abd/Pel for Dissection W and/or Wo Contrast  Result Date: 10/24/2021 CLINICAL DATA:  Chest pain or back pain, aortic dissection suspected. severe abdominal pain associated with dizziness and CP upon exertion. R/O AAA EXAM: CT ANGIOGRAPHY CHEST, ABDOMEN AND PELVIS TECHNIQUE: Non-contrast CT of the chest was initially obtained. Multidetector CT imaging through the chest, abdomen and pelvis was performed using the standard protocol during bolus administration of intravenous contrast. Multiplanar reconstructed images and MIPs were obtained and reviewed to evaluate the vascular anatomy.  CONTRAST:  163mL OMNIPAQUE IOHEXOL 350 MG/ML SOLN COMPARISON:  CT chest 12/18/2000 FINDINGS: CTA CHEST FINDINGS Cardiovascular: Preferential opacification of the thoracic aorta. No evidence of thoracic aortic aneurysm or dissection. Mild atherosclerotic plaque of the thoracic aorta. At least 2 vessel coronary artery calcifications. Normal heart size. No pericardial effusion. The main pulmonary artery is normal in caliber. No pulmonary embolus. Mediastinum/Nodes: Borderline enlarged precarinal lymph nodes measuring 1 cm (7:51, 48). Posterior mediastinal mass versus conglomerate of lymph nodes measuring 3 x 1.2 cm and abutting the esophagus. Prominent but nonenlarged right hilar lymph node. No enlarged hilar or axillary lymph nodes. Heterogeneous appearing visualized  thyroid glands. Not clinically significant; no follow-up imaging recommended (ref: J Am Coll Radiol. 2015 Feb;12(2): 143-50). The trachea and esophagus demonstrate no significant findings. Small hiatal hernia. Lungs/Pleura: No focal consolidation. No pulmonary nodule. No pulmonary mass. No pleural effusion. No pneumothorax. Musculoskeletal: No chest wall abnormality. No suspicious lytic or blastic osseous lesions. No acute displaced fracture. Multilevel degenerative changes of the spine. Review of the MIP images confirms the above findings. CTA ABDOMEN AND PELVIS FINDINGS VASCULAR Aorta: Mild atherosclerotic plaque. Normal caliber aorta without aneurysm, dissection, vasculitis or significant stenosis. Celiac: Patent without evidence of aneurysm, dissection, vasculitis or significant stenosis. SMA: Patent without evidence of aneurysm, dissection, vasculitis or significant stenosis. Renals: Both renal arteries are patent without evidence of aneurysm, dissection, vasculitis, fibromuscular dysplasia or significant stenosis. IMA: Patent without evidence of aneurysm, dissection, vasculitis or significant stenosis. Inflow: Mild atherosclerotic plaque. Patent  without evidence of aneurysm, dissection, vasculitis or significant stenosis. Veins: No obvious venous abnormality within the limitations of this arterial phase study. Review of the MIP images confirms the above findings. NON-VASCULAR Hepatobiliary: No focal liver abnormality. No gallstones, gallbladder wall thickening, or pericholecystic fluid. No biliary dilatation. Pancreas: No focal lesion. Normal pancreatic contour. No surrounding inflammatory changes. No main pancreatic ductal dilatation. Spleen: Normal in size without focal abnormality. Adrenals/Urinary Tract: No adrenal nodule bilaterally. Bilateral kidneys enhance symmetrically. Subcentimeter hypodensities are too small to characterize. No hydronephrosis. No hydroureter. The urinary bladder is unremarkable. Stomach/Bowel: Stomach is within normal limits. No evidence of bowel wall thickening or dilatation. Appendix appears normal. Lymphatic: No pulmonary embolus. Reproductive: Prominent prostate. Other: No intraperitoneal free fluid. No intraperitoneal free gas. No organized fluid collection. Musculoskeletal: Small fat containing right inguinal hernia. Small left inguinal hernia containing a short loop of large bowel. Abdominal defect of 3 cm on the left and 4 cm on the right. No suspicious lytic or blastic osseous lesions. No acute displaced fracture. L1 vertebral body hemangioma. Densely sclerotic lesion within the L2 vertebral body likely a bone island. Multilevel mild-to-moderate degenerative changes of the spine. Review of the MIP images confirms the above findings. IMPRESSION: 1. No acute aortic abnormality. Aortic Atherosclerosis (ICD10-I70.0). 2. No pulmonary embolus. 3. Nonspecific mediastinal lymphadenopathy. Comparison with prior cross-sectional imaging would be of value given report of CT chest 12/18/2000. 4. Small left inguinal hernia containing a short loop of large bowel. Small fat containing right inguinal hernia. No findings suggest bowel  obstruction or ischemia. 5. Small hiatal hernia. 6. Prominent prostate. Electronically Signed   By: Iven Finn M.D.   On: 10/24/2021 18:13    Procedures Hernia reduction  Date/Time: 10/25/2021 4:39 PM Performed by: Tedd Sias, PA Authorized by: Tedd Sias, PA  Consent: Verbal consent obtained. Risks and benefits: risks, benefits and alternatives were discussed Consent given by: patient Patient understanding: patient states understanding of the procedure being performed Patient consent: the patient's understanding of the procedure matches consent given Relevant documents: relevant documents present and verified Test results: test results available and properly labeled Imaging studies: imaging studies available Patient identity confirmed: verbally with patient and arm band Local anesthesia used: no  Anesthesia: Local anesthesia used: no  Sedation: Patient sedated: no  Patient tolerance: patient tolerated the procedure well with no immediate complications Comments: Left inguinal hernia reduced.  Patient's pain completely resolved.      Medications Ordered in ED Medications  HYDROmorphone (DILAUDID) injection 1 mg (1 mg Intravenous Given 10/24/21 1650)  ondansetron (ZOFRAN) injection 4 mg (4 mg Intravenous Given 10/24/21 1649)  iohexol (OMNIPAQUE) 350 MG/ML injection 100 mL (100 mLs Intravenous Contrast Given 10/24/21 1727)    ED Course/ Medical Decision Making/ A&P                           Medical Decision Making  Patient is an 81 year old gentleman presented emergency room today with complaints of bilateral lower abdominal pain for 3 to 4 days.  Physical exam notable for left-sided inguinal tenderness.  CT abdomen pelvis shows that patient has small left inguinal hernia containing a short loop of large bowel and a fat-containing hernia on the right side.  Patient is somewhat tender on both sides.  I was able to manually reduce patient's left-sided hernia.  He  had complete resolution of pain.  I had him sit back up and lay back down and he said that he had some mild recurrence of pain and I was able to re-reduce the hernia.  Patient received hydromorphone initially when he was brought to the emergency room it has been 18 hours since he received this.  He is pain-free after my reduction.  Labs show mild leukocytosis no anemia.  CMP unremarkable troponin within normal limits.  I-STAT Chem-8 within limits.  I discussed this case with my attending physician who cosigned this note including patient's presenting symptoms, physical exam, and planned diagnostics and interventions. Attending physician stated agreement with plan or made changes to plan which were implemented.   Patient will follow up with general surgery.  He understands how to reduce his hernia and understands return precautions of if he has intractable vomiting, ceases to poop, or has new or worsening pain to come to the emergency room for reevaluation.  He does feel he is coughing frequently and his hernia initially seem to occur when he was having a coughing fit.  I provided him with antitussives as well as MiraLAX and tramadol for pain.  Final Clinical Impression(s) / ED Diagnoses Final diagnoses:  Non-recurrent unilateral inguinal hernia without obstruction or gangrene  Left groin hernia    Rx / DC Orders ED Discharge Orders          Ordered    traMADol (ULTRAM) 50 MG tablet  Every 6 hours PRN        10/25/21 1214    polyethylene glycol powder (GLYCOLAX/MIRALAX) 17 GM/SCOOP powder  3 times daily        10/25/21 1214    benzonatate (TESSALON) 100 MG capsule  Every 8 hours        10/25/21 1215              Tedd Sias, [place] 10/26/21 1330    Malvin Johns, MD 10/26/21 1431

## 2021-10-25 NOTE — Discharge Instructions (Addendum)
You have a hernia of both the left and right side of your abdomen.  The hernia on the left side of your abdomen is larger.  Your CT scan does not show any other abnormalities.  Your labs were reassuring.  Please read the attached information about hernias.  Ultimately you will need to see a general surgeon I recommend making this appointment and following up as quickly as possible given how severe your pain was.  Take miralax 1 cap full 2-3 times daily.  Drink plenty of water.  I prescribed you Zofran for nausea you may take this as needed.  I also prescribed you a few tablets of tramadol which is a pain medicine that you may use for pain.  You may reduce the hernia in your abdomen by pushing on it similar to how I did it here in the emergency room.  I have also printed you prescription for benzonatate which is a cough medicine.  You may also use over-the-counter cough medications drink warm tea with honey.  Avoid straining as best you can.  Should he have any worsening pain, vomiting, fever, difficulty having a BM please return to the emergency room.  I have given you the information for a general surgeon to follow-up with.   Please call today to make an appointment as soon as possible.

## 2021-10-26 ENCOUNTER — Encounter: Payer: PPO | Admitting: Physical Therapy

## 2021-10-29 ENCOUNTER — Other Ambulatory Visit: Payer: Self-pay

## 2021-10-29 ENCOUNTER — Ambulatory Visit: Payer: PPO | Admitting: Physical Therapy

## 2021-10-29 ENCOUNTER — Encounter: Payer: Self-pay | Admitting: Physical Therapy

## 2021-10-29 DIAGNOSIS — M25561 Pain in right knee: Secondary | ICD-10-CM | POA: Diagnosis not present

## 2021-10-29 DIAGNOSIS — R262 Difficulty in walking, not elsewhere classified: Secondary | ICD-10-CM | POA: Diagnosis not present

## 2021-10-29 DIAGNOSIS — M6281 Muscle weakness (generalized): Secondary | ICD-10-CM | POA: Diagnosis not present

## 2021-10-29 DIAGNOSIS — M25661 Stiffness of right knee, not elsewhere classified: Secondary | ICD-10-CM

## 2021-10-29 DIAGNOSIS — R600 Localized edema: Secondary | ICD-10-CM

## 2021-10-29 NOTE — Therapy (Signed)
[place], [place], [address] Phone: [phone]   Fax:  [phone]  Physical Therapy Treatment  Patient Details  Name: Bryan Thomas MRN: [id number] Date of Birth: [birth year] Referring Provider (PT): Frankey Shown MD   Encounter Date: 10/29/2021   PT End of Session - 10/29/21 1110     Visit Number 7    Number of Visits 17    Date for PT Re-Evaluation 11/30/21    PT Start Time 1100    PT Stop Time 1145    PT Time Calculation (min) 45 min    Activity Tolerance Patient tolerated treatment well    Behavior During Therapy [facility] for tasks assessed/performed             Past Medical History:  Diagnosis Date   Generalized osteoarthritis of multiple sites     Past Surgical History:  Procedure Laterality Date   KNEE ARTHROSCOPY Right 06/21/1989   LUMBAR [place] SURGERY  06/21/1969   TONSILLECTOMY     removed as a teenager   TOTAL KNEE ARTHROPLASTY Right 09/17/2021   Procedure: RIGHT TOTAL KNEE ARTHROPLASTY;  Surgeon: Leandrew Koyanagi, MD;  Location: [place];  Service: Orthopedics;  Laterality: Right;    There were no vitals filed for this visit.   Subjective Assessment - 10/29/21 1106     Subjective Pt arriving today reporting 3/10 pain in his right knee. Pt stating he was in ER on 10/24/2021 for inguinal hernia.    Pertinent History PMH:  right knee arthroscopy 1990, lumbar disc surgery 1970    Limitations House hold activities;Walking    Patient Stated Goals Walk without walker, stop hurting, work in shop again    Currently in Pain? Yes    Pain Score 3     Pain Orientation Right    Pain Descriptors / Indicators Aching;Sore;Tightness    Pain Type Surgical pain    Pain Onset More than a month ago                [facility] PT Assessment - 10/29/21 0001       Assessment   Medical Diagnosis right TKA M17.11    Referring Provider (PT) Frankey Shown MD      AROM   Right Knee Extension -6    Right Knee Flexion 105       PROM   Right Knee Extension -4    Right Knee Flexion 112      Ambulation/Gait   Gait Comments walking with single point cane on level surfaces                           [facility] Adult PT Treatment/Exercise - 10/29/21 0001       Knee/Hip Exercises: Aerobic   Stationary Bike seat 9 full revolutions 8 min      Knee/Hip Exercises: Machines for Strengthening   Cybex Leg Press 75# bilateral LE 3 x10, Rt LE only 37# 2x10      Knee/Hip Exercises: Standing   Heel Raises 10 reps    Forward Step Up 10 reps;Right;Hand Hold: 1;Step Height: 4"      Knee/Hip Exercises: Seated   Long Arc Quad Right;10 reps;3 sets;Weights    Long Arc Quad Weight 5 lbs.    Long [employer] Limitations 5 sec hold    Other Seated Knee/Hip Exercises heel slides x 1 ;minute    Sit to Sand with UE support;5 reps   20  seconds 5 time sit to stand with UE support     Modalities   Modalities Vasopneumatic      Vasopneumatic   Number Minutes Vasopneumatic  10 minutes    Vasopnuematic Location  Knee    Vasopneumatic Pressure Medium    Vasopneumatic Temperature  34      Manual Therapy   Manual therapy comments Rt knee PROM flexion and extension, manual hamstring stretching                       PT Short Term Goals - 10/29/21 1110       PT SHORT TERM GOAL #1   Title Pt will be independent in his initial HEP    Status Achieved      PT SHORT TERM GOAL #2   Title Pt will improve 5 time sit to stand to </= 20 seconds with UE support    Baseline 20 seconds with UE support on 10/29/2021    Status Achieved               PT Long Term Goals - 10/29/21 1111       PT LONG TERM GOAL #1   Title Pt will be indepdent in his advanced HEP    Status On-going      PT LONG TERM GOAL #2   Title Pt will be able to amb with no device on level surfaces for 1000 feet using step through gait pattern safely.    Status On-going      PT LONG TERM GOAL #3   Title Pt will be able to climb 1  flight of stairs with single hand rail with step over step pattern with pain </= 3/10.    Status On-going      PT LONG TERM GOAL #4   Title pt will improve his right knee AROM to >/= 120 degrees in order to improve funcitonal mobility with pain </= 3/10.    Status On-going      PT LONG TERM GOAL #5   Title Pt will improve his FOTO to >/= 53 %    Status On-going                   Plan - 10/29/21 1131     Clinical Impression Statement Pt tolerating all exercises well today. Mild SOB noted, O2 sats 97% on room air. HR 68-82 bpm. Pt has improved his AROM arc 6-105 degrees and PROM arc 4-112 degrees. Pt progressing with more standing funcitonal activities and beginning step ups. Continue skilled PT to maximize pt's function.    Personal Factors and Comorbidities Comorbidity 3+    Comorbidities right knee arthroscopy 1990, lumbar disc surgery 1970    Examination-Activity Limitations Lift;Transfers;Squat;Stairs;Stand;Dressing;Sit    Examination-Participation Restrictions Community Activity;Other;Yard Work    Stability/Clinical Decision Making Stable/Uncomplicated    Rehab Potential Good    PT Frequency 2x / week    PT Duration 8 weeks    PT Treatment/Interventions ADLs/Self Care Home Management;Vasopneumatic Device;Taping;Cryotherapy;Counsellor;Therapeutic exercise;Therapeutic activities;Functional mobility training;Stair training;Gait training;Neuromuscular re-education;Patient/family education;Passive range of motion;Scar mobilization;Manual techniques    PT Next Visit Plan Emphasis on AROM, quadriceps strength, continue with balance/gait training PRN, vasopneumatic as needed    PT Home Exercise Plan Access Code: [email or web address]    Consulted and Agree with Plan of Care Patient             Patient will benefit from skilled therapeutic intervention in order to improve the following  deficits and impairments:  Pain, Difficulty walking, Decreased  activity tolerance, Decreased balance, Decreased mobility, Decreased strength, Increased edema, Impaired flexibility  Visit Diagnosis: Difficulty in walking, not elsewhere classified  Muscle weakness (generalized)  Localized edema  Stiffness of right knee, not elsewhere classified  Acute pain of right knee     Problem List Patient Active Problem List   Diagnosis Date Noted   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 [place], [place], [address] Phone: [phone]   Fax:  [phone]  Name: KAYDYN SAYAS MRN: [id number] Date of Birth: 04/01/1941

## 2021-10-30 ENCOUNTER — Ambulatory Visit (INDEPENDENT_AMBULATORY_CARE_PROVIDER_SITE_OTHER): Payer: PPO | Admitting: Orthopaedic Surgery

## 2021-10-30 ENCOUNTER — Encounter: Payer: Self-pay | Admitting: Orthopaedic Surgery

## 2021-10-30 ENCOUNTER — Ambulatory Visit (INDEPENDENT_AMBULATORY_CARE_PROVIDER_SITE_OTHER): Payer: PPO

## 2021-10-30 ENCOUNTER — Telehealth: Payer: Self-pay | Admitting: *Deleted

## 2021-10-30 DIAGNOSIS — Z96651 Presence of right artificial knee joint: Secondary | ICD-10-CM | POA: Diagnosis not present

## 2021-10-30 NOTE — Progress Notes (Signed)
° °  Post-Op Visit Note   Patient: Bryan Thomas           Date of Birth: 07/05/1941           MRN: [id number] Visit Date: 10/30/2021 PCP: Venia Carbon, MD   Assessment & Plan:  Chief Complaint:  Chief Complaint  Patient presents with   Right Knee - Follow-up    Right total knee arthroplasty 09/17/2021   Visit Diagnoses:  1. Status post total right knee replacement     Plan: Patient is a pleasant 81 year old gentleman who comes in today 6 weeks status post right total knee replacement 09/17/2021.  He has been doing well.  He is in physical therapy making good progress.  He has some discomfort to the right knee but is not taking medication.  He was recently seen in the ED for lower abdominal pain.  He ended up being diagnosed with what sounds like inguinal hernias.  Examination of the right knee reveals a fully healed surgical scar without complication.  Range of motion 0 to 105 degrees.  He is stable vascular stress.  Calf soft nontender.  He is neurovascular intact distally.  At this point, he will finish out his remaining few weeks of physical therapy.  We will continue to advance with activity as tolerated.  Dental prophylaxis reinforced.  He may discontinue his aspirin.  Follow-up with [place] in 6 weeks time for recheck.  Call with concerns or questions in meantime.  Follow-Up Instructions: Return in about 6 weeks (around 12/11/2021).   Orders:  Orders Placed This Encounter  Procedures   XR Knee 1-2 Views Right   No orders of the defined types were placed in this encounter.   Imaging: XR Knee 1-2 Views Right  Result Date: 10/30/2021 Well seated prosthesis without complication   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   Pre-op examination 09/10/2021   Cough 09/10/2021   Primary osteoarthritis of left knee 08/07/2021   Primary osteoarthritis of right knee 08/07/2021   Sensory loss  02/13/2016   Preventative health care 02/13/2016   Advance directive discussed with patient 02/13/2016   Generalized osteoarthritis of multiple sites    Past Medical History:  Diagnosis Date   Generalized osteoarthritis of multiple sites     Family History  Problem Relation Age of Onset   Cancer Mother    Diabetes Sister    Diabetes Brother    Heart disease Brother        CABG   Diabetes Brother    Diabetes Brother     Past Surgical History:  Procedure Laterality Date   KNEE ARTHROSCOPY Right 06/21/1989   LUMBAR [place] SURGERY  06/21/1969   TONSILLECTOMY     removed as a teenager   TOTAL KNEE ARTHROPLASTY Right 09/17/2021   Procedure: RIGHT TOTAL KNEE ARTHROPLASTY;  Surgeon: Leandrew Koyanagi, MD;  Location: [place];  Service: Orthopedics;  Laterality: Right;   Social History   Occupational History   Occupation: TEFL teacher    Comment: tired  Tobacco Use   Smoking status: Former   Smokeless tobacco: Former    Types: Chew    Quit date: 08/21/2021  Vaping Use   Vaping Use: Never used  Substance and Sexual Activity   Alcohol use: No    Alcohol/week: 0.0 standard drinks   Drug use: Never   Sexual activity: Not on file

## 2021-10-30 NOTE — Telephone Encounter (Signed)
Ortho bundle 30 day call and survey completed. ?

## 2021-10-31 ENCOUNTER — Other Ambulatory Visit: Payer: Self-pay

## 2021-10-31 ENCOUNTER — Encounter: Payer: Self-pay | Admitting: Physical Therapy

## 2021-10-31 ENCOUNTER — Ambulatory Visit: Payer: PPO | Admitting: Physical Therapy

## 2021-10-31 DIAGNOSIS — R262 Difficulty in walking, not elsewhere classified: Secondary | ICD-10-CM | POA: Diagnosis not present

## 2021-10-31 DIAGNOSIS — M6281 Muscle weakness (generalized): Secondary | ICD-10-CM

## 2021-10-31 DIAGNOSIS — M25661 Stiffness of right knee, not elsewhere classified: Secondary | ICD-10-CM | POA: Diagnosis not present

## 2021-10-31 DIAGNOSIS — M25561 Pain in right knee: Secondary | ICD-10-CM

## 2021-10-31 DIAGNOSIS — R600 Localized edema: Secondary | ICD-10-CM

## 2021-10-31 NOTE — Therapy (Signed)
[place], [place], [address] Phone: [phone]   Fax:  [phone]  Physical Therapy Treatment  Patient Details  Name: ALIX STOWERS MRN: [id number] Date of Birth: 09/28/1941 Referring Provider (PT): Frankey Shown MD   Encounter Date: 10/31/2021   PT End of Session - 10/31/21 1126     Visit Number 8    Number of Visits 17    Date for PT Re-Evaluation 11/30/21    PT Start Time 1105    PT Stop Time 1145    PT Time Calculation (min) 40 min    Activity Tolerance Patient tolerated treatment well    Behavior During Therapy [facility] for tasks assessed/performed             Past Medical History:  Diagnosis Date   Generalized osteoarthritis of multiple sites     Past Surgical History:  Procedure Laterality Date   KNEE ARTHROSCOPY Right 06/21/1989   LUMBAR [place] SURGERY  06/21/1969   TONSILLECTOMY     removed as a teenager   TOTAL KNEE ARTHROPLASTY Right 09/17/2021   Procedure: RIGHT TOTAL KNEE ARTHROPLASTY;  Surgeon: Leandrew Koyanagi, MD;  Location: [place];  Service: Orthopedics;  Laterality: Right;    There were no vitals filed for this visit.   Subjective Assessment - 10/31/21 1108     Subjective Pt reporting numbness in his right knee. Pt stating he noted it while he was driving.    Pertinent History PMH:  right knee arthroscopy 1990, lumbar disc surgery 1970    Limitations House hold activities;Walking    Patient Stated Goals Walk without walker, stop hurting, work in shop again    Currently in Pain? No/denies                [facility] PT Assessment - 10/31/21 0001       Assessment   Medical Diagnosis right TKA M17.11    Referring Provider (PT) Frankey Shown MD      AROM   Right Knee Extension -6    Right Knee Flexion 104      PROM   Right Knee Extension -4    Right Knee Flexion 110                           [facility] Adult PT Treatment/Exercise - 10/31/21 0001       Knee/Hip  Exercises: Aerobic   Nustep L6 x 6 minutes      Knee/Hip Exercises: Machines for Strengthening   Cybex Leg Press 75# x10, 81# 2x10, Rt LE: 37# 2x10      Knee/Hip Exercises: Standing   Heel Raises 15 reps   toe raises   Forward Step Up Right;15 reps;Hand Hold: 1    Other Standing Knee Exercises hip abduction 2x10 each LE with UE support      Knee/Hip Exercises: Seated   Long Arc Quad Right;10 reps;3 sets;Weights    Long Arc Quad Weight 5 lbs.    Long [employer] Limitations 5 second hold    Sit to [employer] 10 reps;without UE support   from 20 inch height surface                      PT Short Term Goals - 10/29/21 1110       PT SHORT TERM GOAL #1   Title Pt will be independent in his initial HEP    Status Achieved  PT SHORT TERM GOAL #2   Title Pt will improve 5 time sit to stand to </= 20 seconds with UE support    Baseline 20 seconds with UE support on 10/29/2021    Status Achieved               PT Long Term Goals - 10/31/21 1156       PT LONG TERM GOAL #1   Title Pt will be indepdent in his advanced HEP    Status On-going      PT LONG TERM GOAL #2   Title Pt will be able to amb with no device on level surfaces for 1000 feet using step through gait pattern safely.    Status On-going      PT LONG TERM GOAL #3   Title Pt will be able to climb 1 flight of stairs with single hand rail with step over step pattern with pain </= 3/10.    Status On-going      PT LONG TERM GOAL #4   Title pt will improve his right knee AROM to >/= 120 degrees in order to improve funcitonal mobility with pain </= 3/10.    Baseline AROM: 6-106 degrees    Status On-going      PT LONG TERM GOAL #5   Title Pt will improve his FOTO to >/= 53 %    Status On-going                   Plan - 10/31/21 1127     Clinical Impression Statement Pt reporting numbness in his right knee today. No pain reported. Pt amb with walking stick for balance with mild increased in lateral  sway. AROM arc 6-104 degrees in his right knee. Pt able to stand from chair 20 inches without the use of his hands.Continue skilled PT to maximize function.    Personal Factors and Comorbidities Comorbidity 3+    Comorbidities right knee arthroscopy 1990, lumbar disc surgery 1970    Examination-Activity Limitations Lift;Transfers;Squat;Stairs;Stand;Dressing;Sit    Examination-Participation Restrictions Community Activity;Other;Yard Work    Stability/Clinical Decision Making Stable/Uncomplicated    Rehab Potential Good    PT Frequency 2x / week    PT Duration 8 weeks    PT Treatment/Interventions ADLs/Self Care Home Management;Vasopneumatic Device;Taping;Cryotherapy;Counsellor;Therapeutic exercise;Therapeutic activities;Functional mobility training;Stair training;Gait training;Neuromuscular re-education;Patient/family education;Passive range of motion;Scar mobilization;Manual techniques    PT Next Visit Plan Emphasis on AROM, quadriceps strength, continue with balance/gait training PRN, vasopneumatic as needed    PT Home Exercise Plan Access Code: [email or web address]    Consulted and Agree with Plan of Care Patient             Patient will benefit from skilled therapeutic intervention in order to improve the following deficits and impairments:  Pain, Difficulty walking, Decreased activity tolerance, Decreased balance, Decreased mobility, Decreased strength, Increased edema, Impaired flexibility  Visit Diagnosis: Difficulty in walking, not elsewhere classified  Muscle weakness (generalized)  Localized edema  Stiffness of right knee, not elsewhere classified  Acute pain of right knee     Problem List Patient Active Problem List   Diagnosis Date Noted   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   Pre-op examination 09/10/2021   Cough 09/10/2021   Primary osteoarthritis of left knee 08/07/2021   Primary osteoarthritis of right knee  08/07/2021   Sensory loss 02/13/2016   Preventative health care 02/13/2016   Advance directive discussed with patient 02/13/2016   Generalized osteoarthritis of multiple  sites     Oretha Caprice, PT, MPT 10/31/2021, 11:59 AM  [facility] Physical Therapy [address] [place], [place], [address] Phone: [phone]   Fax:  [phone]  Name: AREND BAHL MRN: [id number] Date of Birth: 24-Aug-1941

## 2021-11-05 ENCOUNTER — Other Ambulatory Visit: Payer: Self-pay

## 2021-11-05 ENCOUNTER — Ambulatory Visit (INDEPENDENT_AMBULATORY_CARE_PROVIDER_SITE_OTHER): Payer: PPO | Admitting: Physical Therapy

## 2021-11-05 ENCOUNTER — Encounter: Payer: Self-pay | Admitting: Physical Therapy

## 2021-11-05 DIAGNOSIS — M6281 Muscle weakness (generalized): Secondary | ICD-10-CM

## 2021-11-05 DIAGNOSIS — M25661 Stiffness of right knee, not elsewhere classified: Secondary | ICD-10-CM

## 2021-11-05 DIAGNOSIS — R262 Difficulty in walking, not elsewhere classified: Secondary | ICD-10-CM

## 2021-11-05 DIAGNOSIS — M25561 Pain in right knee: Secondary | ICD-10-CM | POA: Diagnosis not present

## 2021-11-05 DIAGNOSIS — R600 Localized edema: Secondary | ICD-10-CM

## 2021-11-05 NOTE — Therapy (Signed)
[place], [place], [address] Phone: [phone]   Fax:  [phone]  Physical Therapy Treatment  Patient Details  Name: Bryan Thomas MRN: [id number] Date of Birth: [birth year] Referring Provider (PT): Frankey Shown MD   Encounter Date: 11/05/2021   PT End of Session - 11/05/21 1139     Visit Number 9    Number of Visits 17    Date for PT Re-Evaluation 11/30/21    PT Start Time 1100    PT Stop Time 1152    PT Time Calculation (min) 52 min    Activity Tolerance Patient tolerated treatment well    Behavior During Therapy [facility] for tasks assessed/performed             Past Medical History:  Diagnosis Date   Generalized osteoarthritis of multiple sites     Past Surgical History:  Procedure Laterality Date   KNEE ARTHROSCOPY Right 06/21/1989   LUMBAR [place] SURGERY  06/21/1969   TONSILLECTOMY     removed as a teenager   TOTAL KNEE ARTHROPLASTY Right 09/17/2021   Procedure: RIGHT TOTAL KNEE ARTHROPLASTY;  Surgeon: Leandrew Koyanagi, MD;  Location: [place];  Service: Orthopedics;  Laterality: Right;    There were no vitals filed for this visit.   Subjective Assessment - 11/05/21 1133     Subjective Pt arriving today reporting no pain. Still reporting feeling of numbness across his entire knee.    Pertinent History PMH:  right knee arthroscopy 1990, lumbar disc surgery 1970    Limitations House hold activities;Walking    Patient Stated Goals Walk without walker, stop hurting, work in shop again    Currently in Pain? No/denies                [facility] PT Assessment - 11/05/21 0001       Assessment   Medical Diagnosis right TKA M17.11    Referring Provider (PT) Frankey Shown MD      AROM   Right Knee Extension -5    Right Knee Flexion 110      PROM   Right Knee Extension -4    Right Knee Flexion 115                           [facility] Adult PT Treatment/Exercise - 11/05/21 0001        Knee/Hip Exercises: Aerobic   Recumbent Bike full revolutions L4 x 8 minutes   seat at 8     Knee/Hip Exercises: Machines for Strengthening   Cybex Leg Press 100# bilateral LE's 2x15, Rt LE only: 50# 2x 10      Knee/Hip Exercises: Standing   Lateral Step Up Right;Left;2 sets;10 reps    Forward Step Up Right;15 reps;Hand Hold: 1      Knee/Hip Exercises: Seated   Long Arc Quad Right;10 reps;3 sets;Weights    Long Arc Quad Weight 6 lbs.    Long Arc Quad Limitations 5 second hold      Vasopneumatic   Number Minutes Vasopneumatic  10 minutes    Vasopnuematic Location  Knee    Vasopneumatic Pressure Medium    Vasopneumatic Temperature  34      Manual Therapy   Manual therapy comments Rt knee PROM: flexion/extension                       PT Short Term Goals - 10/29/21 1110  PT SHORT TERM GOAL #1   Title Pt will be independent in his initial HEP    Status Achieved      PT SHORT TERM GOAL #2   Title Pt will improve 5 time sit to stand to </= 20 seconds with UE support    Baseline 20 seconds with UE support on 10/29/2021    Status Achieved               PT Long Term Goals - 10/31/21 1156       PT LONG TERM GOAL #1   Title Pt will be indepdent in his advanced HEP    Status On-going      PT LONG TERM GOAL #2   Title Pt will be able to amb with no device on level surfaces for 1000 feet using step through gait pattern safely.    Status On-going      PT LONG TERM GOAL #3   Title Pt will be able to climb 1 flight of stairs with single hand rail with step over step pattern with pain </= 3/10.    Status On-going      PT LONG TERM GOAL #4   Title pt will improve his right knee AROM to >/= 120 degrees in order to improve funcitonal mobility with pain </= 3/10.    Baseline AROM: 6-106 degrees    Status On-going      PT LONG TERM GOAL #5   Title Pt will improve his FOTO to >/= 53 %    Status On-going                   Plan - 11/05/21 1143      Clinical Impression Statement Pt still reporting numbness in his right knee. No pain reported today. Pt's PROM has improved to 115 degrees, AROM is at 110 degrees after manual therapy. Pt progressing with strengthening exercises being able to tolerate increased weights on machnine. Continue skilled PT progressing toward LTG's set.    Personal Factors and Comorbidities Comorbidity 3+    Comorbidities right knee arthroscopy 1990, lumbar disc surgery 1970    Examination-Activity Limitations Lift;Transfers;Squat;Stairs;Stand;Dressing;Sit    Examination-Participation Restrictions Community Activity;Other;Yard Work    Stability/Clinical Decision Making Stable/Uncomplicated    Rehab Potential Good    PT Frequency 2x / week    PT Duration 8 weeks    PT Treatment/Interventions ADLs/Self Care Home Management;Vasopneumatic Device;Taping;Cryotherapy;Counsellor;Therapeutic exercise;Therapeutic activities;Functional mobility training;Stair training;Gait training;Neuromuscular re-education;Patient/family education;Passive range of motion;Scar mobilization;Manual techniques    PT Next Visit Plan FOTO and PN at next visit, Continue Emphasis on AROM, quadriceps strength, continue with balance/gait training PRN, vasopneumatic as needed    PT Home Exercise Plan Access Code: [email or web address]    Consulted and Agree with Plan of Care Patient             Patient will benefit from skilled therapeutic intervention in order to improve the following deficits and impairments:  Pain, Difficulty walking, Decreased activity tolerance, Decreased balance, Decreased mobility, Decreased strength, Increased edema, Impaired flexibility  Visit Diagnosis: Difficulty in walking, not elsewhere classified  Muscle weakness (generalized)  Localized edema  Stiffness of right knee, not elsewhere classified  Acute pain of right knee     Problem List Patient Active Problem List   Diagnosis  Date Noted   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   Pre-op examination 09/10/2021   Cough 09/10/2021   Primary osteoarthritis of left knee 08/07/2021   Primary osteoarthritis  of right knee 08/07/2021   Sensory loss 02/13/2016   Preventative health care 02/13/2016   Advance directive discussed with patient 02/13/2016   Generalized osteoarthritis of multiple sites     Oretha Caprice, [place], MPT 11/05/2021, 11:48 AM  [facility] Physical Therapy [address] [place], [place], [address] Phone: [phone]   Fax:  [phone]  Name: JAELEN SOTH MRN: [id number] Date of Birth: 06-12-1941

## 2021-11-07 ENCOUNTER — Other Ambulatory Visit: Payer: Self-pay

## 2021-11-07 ENCOUNTER — Ambulatory Visit (INDEPENDENT_AMBULATORY_CARE_PROVIDER_SITE_OTHER): Payer: PPO | Admitting: Physical Therapy

## 2021-11-07 ENCOUNTER — Encounter: Payer: Self-pay | Admitting: Physical Therapy

## 2021-11-07 DIAGNOSIS — M25661 Stiffness of right knee, not elsewhere classified: Secondary | ICD-10-CM | POA: Diagnosis not present

## 2021-11-07 DIAGNOSIS — R600 Localized edema: Secondary | ICD-10-CM

## 2021-11-07 DIAGNOSIS — M6281 Muscle weakness (generalized): Secondary | ICD-10-CM

## 2021-11-07 DIAGNOSIS — R262 Difficulty in walking, not elsewhere classified: Secondary | ICD-10-CM | POA: Diagnosis not present

## 2021-11-07 DIAGNOSIS — M25561 Pain in right knee: Secondary | ICD-10-CM | POA: Diagnosis not present

## 2021-11-07 NOTE — Therapy (Signed)
[place], [place], [address] Phone: [phone]   Fax:  [phone]  Physical Therapy Treatment  Patient Details  Name: Bryan Thomas MRN: [id number] Date of Birth: [birth year] Referring Provider (PT): Frankey Shown MD  Progress Note Reporting Period 10/03/2021 to 11/07/2021   See note below for Objective Data and Assessment of Progress/Goals.     Encounter Date: 11/07/2021   PT End of Session - 11/07/21 1123     Visit Number 10    Number of Visits 17    Date for PT Re-Evaluation 11/30/21    Authorization Type PN sent on 11/07/2021 at 10th visit    Progress Note Due on Visit 20    PT Start Time 1102    PT Stop Time 1152    PT Time Calculation (min) 50 min    Activity Tolerance Patient tolerated treatment well    Behavior During Therapy [facility] for tasks assessed/performed             Past Medical History:  Diagnosis Date   Generalized osteoarthritis of multiple sites     Past Surgical History:  Procedure Laterality Date   KNEE ARTHROSCOPY Right 06/21/1989   LUMBAR [place] SURGERY  06/21/1969   TONSILLECTOMY     removed as a teenager   TOTAL KNEE ARTHROPLASTY Right 09/17/2021   Procedure: RIGHT TOTAL KNEE ARTHROPLASTY;  Surgeon: Leandrew Koyanagi, MD;  Location: [place];  Service: Orthopedics;  Laterality: Right;    There were no vitals filed for this visit.   Subjective Assessment - 11/07/21 1119     Subjective Pt arriving today reporting stiffness in right knee.    Pertinent History PMH:  right knee arthroscopy 1990, lumbar disc surgery 1970    Limitations House hold activities;Walking    Patient Stated Goals Walk without walker, stop hurting, work in shop again    Currently in Pain? Yes    Pain Score 2     Pain Location Knee    Pain Orientation Right    Pain Descriptors / Indicators Tightness    Pain Type Surgical pain                [facility] PT Assessment - 11/07/21 0001       Assessment    Medical Diagnosis right TKA M17.11    Referring Provider (PT) Frankey Shown MD      AROM   Right Knee Extension -5    Right Knee Flexion 110      PROM   Right Knee Extension -4    Right Knee Flexion 115                           [facility] Adult PT Treatment/Exercise - 11/07/21 0001       Knee/Hip Exercises: Aerobic   Recumbent Bike full revolutions L4 x 8 minutes   seat at 7     Knee/Hip Exercises: Machines for Strengthening   Cybex Leg Press 100# bilateral LE's 2x15, Rt LE only: 50# 2x 10      Knee/Hip Exercises: Standing   Lateral Step Up Right;Left;2 sets;10 reps      Knee/Hip Exercises: Seated   Other Seated Knee/Hip Exercises seated SLR with 5 pound weight x10      Modalities   Modalities Vasopneumatic      Vasopneumatic   Number Minutes Vasopneumatic  10 minutes    Vasopnuematic Location  Knee    Vasopneumatic Pressure Medium  Vasopneumatic Temperature  34      Manual Therapy   Manual therapy comments Rt knee PROM: flexion/extension                       PT Short Term Goals - 10/29/21 1110       PT SHORT TERM GOAL #1   Title Pt will be independent in his initial HEP    Status Achieved      PT SHORT TERM GOAL #2   Title Pt will improve 5 time sit to stand to </= 20 seconds with UE support    Baseline 20 seconds with UE support on 10/29/2021    Status Achieved               PT Long Term Goals - 10/31/21 1156       PT LONG TERM GOAL #1   Title Pt will be indepdent in his advanced HEP    Status On-going      PT LONG TERM GOAL #2   Title Pt will be able to amb with no device on level surfaces for 1000 feet using step through gait pattern safely.    Status On-going      PT LONG TERM GOAL #3   Title Pt will be able to climb 1 flight of stairs with single hand rail with step over step pattern with pain </= 3/10.    Status On-going      PT LONG TERM GOAL #4   Title pt will improve his right knee AROM to >/= 120 degrees  in order to improve funcitonal mobility with pain </= 3/10.    Baseline AROM: 6-106 degrees    Status On-going      PT LONG TERM GOAL #5   Title Pt will improve his FOTO to >/= 53 %    Status On-going                   Plan - 11/07/21 1123     Clinical Impression Statement Pt arrivng today reporting more stiffness in his right knee and pain only with bending. Pt's AROM is 110 degrees of right knee flexion. Pt still progressing with right LE strengthening and dynamic balance.    Personal Factors and Comorbidities Comorbidity 3+    Comorbidities right knee arthroscopy 1990, lumbar disc surgery 1970    Examination-Activity Limitations Lift;Transfers;Squat;Stairs;Stand;Dressing;Sit    Examination-Participation Restrictions Community Activity;Other;Yard Work    Stability/Clinical Decision Making Stable/Uncomplicated    Rehab Potential Good    PT Frequency 2x / week    PT Duration 8 weeks    PT Treatment/Interventions ADLs/Self Care Home Management;Vasopneumatic Device;Taping;Cryotherapy;Counsellor;Therapeutic exercise;Therapeutic activities;Functional mobility training;Stair training;Gait training;Neuromuscular re-education;Patient/family education;Passive range of motion;Scar mobilization;Manual techniques    PT Next Visit Plan FOTO and PN at next visit, Continue Emphasis on AROM, quadriceps strength, continue with balance/gait training PRN, vasopneumatic as needed    PT Home Exercise Plan Access Code: [email or web address]    Consulted and Agree with Plan of Care Patient             Patient will benefit from skilled therapeutic intervention in order to improve the following deficits and impairments:  Pain, Difficulty walking, Decreased activity tolerance, Decreased balance, Decreased mobility, Decreased strength, Increased edema, Impaired flexibility  Visit Diagnosis: Difficulty in walking, not elsewhere classified  Muscle weakness  (generalized)  Localized edema  Stiffness of right knee, not elsewhere classified  Acute pain of right knee  Problem List Patient Active Problem List   Diagnosis Date Noted   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   Pre-op examination 09/10/2021   Cough 09/10/2021   Primary osteoarthritis of left knee 08/07/2021   Primary osteoarthritis of right knee 08/07/2021   Sensory loss 02/13/2016   Preventative health care 02/13/2016   Advance directive discussed with patient 02/13/2016   Generalized osteoarthritis of multiple sites     Oretha Caprice, PT, MPT 11/07/2021, 11:40 AM  [facility] Physical Therapy [address] [place], [place], [address] Phone: [phone]   Fax:  [phone]  Name: PERFECTO PURDY MRN: [id number] Date of Birth: 1941-09-24

## 2021-11-12 ENCOUNTER — Encounter: Payer: PPO | Admitting: Physical Therapy

## 2021-11-14 ENCOUNTER — Other Ambulatory Visit: Payer: Self-pay

## 2021-11-14 ENCOUNTER — Ambulatory Visit (INDEPENDENT_AMBULATORY_CARE_PROVIDER_SITE_OTHER): Payer: PPO | Admitting: Physical Therapy

## 2021-11-14 ENCOUNTER — Encounter: Payer: Self-pay | Admitting: Physical Therapy

## 2021-11-14 ENCOUNTER — Encounter: Payer: PPO | Admitting: Physical Therapy

## 2021-11-14 DIAGNOSIS — R262 Difficulty in walking, not elsewhere classified: Secondary | ICD-10-CM

## 2021-11-14 DIAGNOSIS — M6281 Muscle weakness (generalized): Secondary | ICD-10-CM

## 2021-11-14 DIAGNOSIS — R600 Localized edema: Secondary | ICD-10-CM | POA: Diagnosis not present

## 2021-11-14 DIAGNOSIS — M25661 Stiffness of right knee, not elsewhere classified: Secondary | ICD-10-CM | POA: Diagnosis not present

## 2021-11-14 DIAGNOSIS — M25561 Pain in right knee: Secondary | ICD-10-CM | POA: Diagnosis not present

## 2021-11-14 NOTE — Therapy (Signed)
[facility] Physical Therapy [address] [place], [place], [address] Phone: [phone]   Fax:  [phone]  Physical Therapy Treatment  Patient Details  Name: Bryan Thomas MRN: [id number] Date of Birth: [birth year] Referring Provider (PT): Frankey Shown MD   Encounter Date: 11/14/2021   PT End of Session - 11/14/21 1257     Visit Number 11    Number of Visits 17    Date for PT Re-Evaluation 11/30/21    Authorization Type PN sent on 11/07/2021 at 10th visit    Progress Note Due on Visit 20    PT Start Time 1300    PT Stop Time 1343    PT Time Calculation (min) 43 min    Activity Tolerance Patient tolerated treatment well    Behavior During Therapy [facility] for tasks assessed/performed             Past Medical History:  Diagnosis Date   Generalized osteoarthritis of multiple sites     Past Surgical History:  Procedure Laterality Date   KNEE ARTHROSCOPY Right 06/21/1989   LUMBAR [place] SURGERY  06/21/1969   TONSILLECTOMY     removed as a teenager   TOTAL KNEE ARTHROPLASTY Right 09/17/2021   Procedure: RIGHT TOTAL KNEE ARTHROPLASTY;  Surgeon: Leandrew Koyanagi, MD;  Location: [place];  Service: Orthopedics;  Laterality: Right;    There were no vitals filed for this visit.   Subjective Assessment - 11/14/21 1301     Subjective States that he doesn't have pain at rest but some pain in his knee with movement. States he can't give a number but it isn't as bad as it has been.    Pertinent History PMH:  right knee arthroscopy 1990, lumbar disc surgery 1970    Limitations House hold activities;Walking    Patient Stated Goals Walk without walker, stop hurting, work in shop again    Currently in Pain? Yes                [facility] PT Assessment - 11/14/21 0001       Assessment   Medical Diagnosis right TKA M17.11    Referring Provider (PT) Frankey Shown MD                           [facility] Adult PT Treatment/Exercise - 11/14/21 0001        Ambulation/Gait   Stairs Yes    Stairs Assistance 5: Supervision    Stair Management Technique Two rails;Alternating pattern    Number of Stairs 4    Height of Stairs 7      Knee/Hip Exercises: Stretches   Active Hamstring Stretch Right;3 reps;30 seconds   seated     Knee/Hip Exercises: Standing   Forward Step Up Right;15 reps;Hand Hold: 1    Forward Step Up Limitations 6" step up and down step    Other Standing Knee Exercises knee fleixon and extension stretch on 8 inch step 4 minutes rocking back and forth and holding at end range    Other Standing Knee Exercises TKE at wall ball x15 5" holds R; chair pose x10 5" holds; standing knee flexin x15 R 5" holds. SLS R with 2 finger support x3 30"holds      Knee/Hip Exercises: Seated   Long Arc Quad AROM;Right;20 reps   5 second holds   Other Seated Knee/Hip Exercises seated heel slide x15 R 5" holds    Sit to Sand without UE support;15  reps                     PT Education - 11/14/21 1309     Education Details on curent presentation, on HEP and POC moving forward    Person(s) Educated Patient    Methods Explanation    Comprehension Verbalized understanding              PT Short Term Goals - 10/29/21 1110       PT SHORT TERM GOAL #1   Title Pt will be independent in his initial HEP    Status Achieved      PT SHORT TERM GOAL #2   Title Pt will improve 5 time sit to stand to </= 20 seconds with UE support    Baseline 20 seconds with UE support on 10/29/2021    Status Achieved               PT Long Term Goals - 11/07/21 1141       PT LONG TERM GOAL #1   Title Pt will be indepdent in his advanced HEP      PT LONG TERM GOAL #2   Title Pt will be able to amb with no device on level surfaces for 1000 feet using step through gait pattern safely.    Baseline amb 1000 feet on level surfaces with mild antalgic gait with no device.    Status Partially Met      PT LONG TERM GOAL #3   Title Pt will be able  to climb 1 flight of stairs with single hand rail with step over step pattern with pain </= 3/10.    Status On-going      PT LONG TERM GOAL #4   Title pt will improve his right knee AROM to >/= 120 degrees in order to improve funcitonal mobility with pain </= 3/10.    Baseline -4 to 115 passively, 110 degrees actively    Status On-going      PT LONG TERM GOAL #5   Title Pt will improve his FOTO to >/= 53 %    Baseline 31% on 10/03/2021, 52% on 11/07/2021 at 10th visit    Status On-going                   Plan - 11/14/21 1309     Clinical Impression Statement Patient with continued weakness and ROM limitations in right knee. Reviewed HEP as patient with initial desire to transition to HEP. After session discussed how he needs to continue with HEP regularly and patient admitted poor adherence to HEP. Discussed continuing with PT for another week to continue to work on ROM and functional strength, patient agreed. Updated and printed off HEP for HEP adherence improved mechanics with transitional movement after educating patient on importance of getting up this way.    Personal Factors and Comorbidities Comorbidity 3+    Comorbidities right knee arthroscopy 1990, lumbar disc surgery 1970    Examination-Activity Limitations Lift;Transfers;Squat;Stairs;Stand;Dressing;Sit    Examination-Participation Restrictions Community Activity;Other;Yard Work    Stability/Clinical Decision Making Stable/Uncomplicated    Rehab Potential Good    PT Frequency 2x / week    PT Duration 8 weeks    PT Treatment/Interventions ADLs/Self Care Home Management;Vasopneumatic Device;Taping;Cryotherapy;Counsellor;Therapeutic exercise;Therapeutic activities;Functional mobility training;Stair training;Gait training;Neuromuscular re-education;Patient/family education;Passive range of motion;Scar mobilization;Manual techniques    PT Next Visit Plan continue wtih ROM and  strengthening as tolerated    PT Home Exercise Plan  Access Code: [email or web address]    Consulted and Agree with Plan of Care Patient             Patient will benefit from skilled therapeutic intervention in order to improve the following deficits and impairments:  Pain, Difficulty walking, Decreased activity tolerance, Decreased balance, Decreased mobility, Decreased strength, Increased edema, Impaired flexibility  Visit Diagnosis: Difficulty in walking, not elsewhere classified  Muscle weakness (generalized)  Localized edema  Stiffness of right knee, not elsewhere classified  Acute pain of right knee     Problem List Patient Active Problem List   Diagnosis Date Noted   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   Pre-op examination 09/10/2021   Cough 09/10/2021   Primary osteoarthritis of left knee 08/07/2021   Primary osteoarthritis of right knee 08/07/2021   Sensory loss 02/13/2016   Preventative health care 02/13/2016   Advance directive discussed with patient 02/13/2016   Generalized osteoarthritis of multiple sites     1:47 PM, 11/14/21 Jerene Pitch, DPT Physical Therapy with [facility]  [phone] office   [facility] Physical Therapy [address] [place], [place], [address] Phone: [phone]   Fax:  [phone]  Name: Bryan Thomas MRN: [id number] Date of Birth: 1941/06/20

## 2021-11-20 ENCOUNTER — Encounter: Payer: Self-pay | Admitting: Physical Therapy

## 2021-11-20 ENCOUNTER — Other Ambulatory Visit: Payer: Self-pay

## 2021-11-20 ENCOUNTER — Ambulatory Visit (INDEPENDENT_AMBULATORY_CARE_PROVIDER_SITE_OTHER): Payer: PPO | Admitting: Physical Therapy

## 2021-11-20 DIAGNOSIS — M25661 Stiffness of right knee, not elsewhere classified: Secondary | ICD-10-CM | POA: Diagnosis not present

## 2021-11-20 DIAGNOSIS — M6281 Muscle weakness (generalized): Secondary | ICD-10-CM

## 2021-11-20 DIAGNOSIS — R600 Localized edema: Secondary | ICD-10-CM | POA: Diagnosis not present

## 2021-11-20 DIAGNOSIS — R262 Difficulty in walking, not elsewhere classified: Secondary | ICD-10-CM | POA: Diagnosis not present

## 2021-11-20 DIAGNOSIS — M25561 Pain in right knee: Secondary | ICD-10-CM

## 2021-11-20 NOTE — Therapy (Signed)
[place], [place], [address] Phone: [phone]   Fax:  [phone]  Physical Therapy Treatment  Patient Details  Name: Bryan Thomas MRN: [id number] Date of Birth: 12-05-1940 Referring Provider (PT): Frankey Shown MD   Encounter Date: 11/20/2021   PT End of Session - 11/20/21 1348     Visit Number 12    Number of Visits 17    Date for PT Re-Evaluation 11/30/21    Authorization Type PN sent on 11/07/2021 at 10th visit    Progress Note Due on Visit 20    PT Start Time 1345    PT Stop Time 1430    PT Time Calculation (min) 45 min    Activity Tolerance Patient tolerated treatment well    Behavior During Therapy [facility] for tasks assessed/performed             Past Medical History:  Diagnosis Date   Generalized osteoarthritis of multiple sites     Past Surgical History:  Procedure Laterality Date   KNEE ARTHROSCOPY Right 06/21/1989   LUMBAR [place] SURGERY  06/21/1969   TONSILLECTOMY     removed as a teenager   TOTAL KNEE ARTHROPLASTY Right 09/17/2021   Procedure: RIGHT TOTAL KNEE ARTHROPLASTY;  Surgeon: Leandrew Koyanagi, MD;  Location: [place];  Service: Orthopedics;  Laterality: Right;    There were no vitals filed for this visit.   Subjective Assessment - 11/20/21 1349     Subjective Pt arriving today reporting pain in bilateral knees. Pt stating pain is 4/10.    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 Yes    Pain Score 4     Pain Location Knee    Pain Orientation Right    Pain Descriptors / Indicators Tightness;Sore;Aching    Pain Type Surgical pain    Pain Onset More than a month ago                [facility] PT Assessment - 11/20/21 0001       Assessment   Medical Diagnosis right TKA M17.11    Referring Provider (PT) Frankey Shown MD      AROM   Right Knee  Extension -5    Right Knee Flexion 112      PROM   Right Knee Extension -4    Right Knee Flexion 115                           [facility] Adult PT Treatment/Exercise - 11/20/21 0001       Knee/Hip Exercises: Stretches   Active Hamstring Stretch Right;3 reps;30 seconds   seated     Knee/Hip Exercises: Aerobic   Recumbent Bike full revolutions x 8 minutes seat at level 8      Knee/Hip Exercises: Standing   Forward Step Up Right;15 reps;Hand Hold: 1    Forward Step Up Limitations 6" step up and down step   each LE leading   Other Standing Knee Exercises knee fleixon and extension stretch on 8 inch step 4 minutes rocking back and forth and holding at end range      Knee/Hip Exercises: Seated   Other Seated Knee/Hip Exercises seated SLR x 15    Sit to Sand without UE support;15 reps  PT Short Term Goals - 10/29/21 1110       PT SHORT TERM GOAL #1   Title Pt will be independent in his initial HEP    Status Achieved      PT SHORT TERM GOAL #2   Title Pt will improve 5 time sit to stand to </= 20 seconds with UE support    Baseline 20 seconds with UE support on 10/29/2021    Status Achieved               PT Long Term Goals - 11/20/21 1353       PT LONG TERM GOAL #1   Title Pt will be indepdent in his advanced HEP    Status On-going      PT LONG TERM GOAL #2   Title Pt will be able to amb with no device on level surfaces for 1000 feet using step through gait pattern safely.    Status On-going      PT LONG TERM GOAL #3   Title Pt will be able to climb 1 flight of stairs with single hand rail with step over step pattern with pain </= 3/10.    Status On-going      PT LONG TERM GOAL #4   Title pt will improve his right knee AROM to >/= 120 degrees in order to improve funcitonal mobility with pain </= 3/10.    Status On-going      PT LONG TERM GOAL #5   Title Pt will improve his FOTO to >/= 53 %    Baseline 31% on  10/03/2021, 52% on 11/07/2021 at 10th visit    Status On-going                   Plan - 11/20/21 1350     Clinical Impression Statement Pt arriving today reporting 4/10 pain in biateral knees. Pt reporting stiffness and saying his pain varies depending on mobility and activities. Pt reporting increased compliance to his HEP after discussion last visit. Pt still progressing with AROM in both flexion and extension. Continue skilled PT to maximzie function.    Personal Factors and Comorbidities Comorbidity 3+    Comorbidities right knee arthroscopy 1990, lumbar disc surgery 1970    Examination-Activity Limitations Lift;Transfers;Squat;Stairs;Stand;Dressing;Sit    Examination-Participation Restrictions Community Activity;Other;Yard Work    Stability/Clinical Decision Making Stable/Uncomplicated    Rehab Potential Good    PT Frequency 2x / week    PT Duration 8 weeks    PT Treatment/Interventions ADLs/Self Care Home Management;Vasopneumatic Device;Taping;Cryotherapy;Counsellor;Therapeutic exercise;Therapeutic activities;Functional mobility training;Stair training;Gait training;Neuromuscular re-education;Patient/family education;Passive range of motion;Scar mobilization;Manual techniques    PT Next Visit Plan continue wtih ROM and strengthening as tolerated, dynamic balance    PT Home Exercise Plan Access Code: [email or web address]    Consulted and Agree with Plan of Care Patient             Patient will benefit from skilled therapeutic intervention in order to improve the following deficits and impairments:  Pain, Difficulty walking, Decreased activity tolerance, Decreased balance, Decreased mobility, Decreased strength, Increased edema, Impaired flexibility  Visit Diagnosis: Difficulty in walking, not elsewhere classified  Muscle weakness (generalized)  Localized edema  Stiffness of right knee, not elsewhere classified  Acute pain of right  knee     Problem List Patient Active Problem List   Diagnosis Date Noted   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   Pre-op examination 09/10/2021   Cough 09/10/2021  Primary osteoarthritis of left knee 08/07/2021   Primary osteoarthritis of right knee 08/07/2021   Sensory loss 02/13/2016   Preventative health care 02/13/2016   Advance directive discussed with patient 02/13/2016   Generalized osteoarthritis of multiple sites     Oretha Caprice, PT, MPT 11/20/2021, 2:00 PM  [facility] Physical Therapy [address] [place], [place], [address] Phone: [phone]   Fax:  [phone]  Name: HASSEN BRUUN MRN: [id number] Date of Birth: [birth year]

## 2021-11-22 ENCOUNTER — Ambulatory Visit (INDEPENDENT_AMBULATORY_CARE_PROVIDER_SITE_OTHER): Payer: PPO | Admitting: Rehabilitative and Restorative Service Providers"

## 2021-11-22 ENCOUNTER — Encounter: Payer: Self-pay | Admitting: Rehabilitative and Restorative Service Providers"

## 2021-11-22 ENCOUNTER — Other Ambulatory Visit: Payer: Self-pay

## 2021-11-22 DIAGNOSIS — R600 Localized edema: Secondary | ICD-10-CM

## 2021-11-22 DIAGNOSIS — R262 Difficulty in walking, not elsewhere classified: Secondary | ICD-10-CM | POA: Diagnosis not present

## 2021-11-22 DIAGNOSIS — M25561 Pain in right knee: Secondary | ICD-10-CM | POA: Diagnosis not present

## 2021-11-22 DIAGNOSIS — M6281 Muscle weakness (generalized): Secondary | ICD-10-CM

## 2021-11-22 DIAGNOSIS — M25661 Stiffness of right knee, not elsewhere classified: Secondary | ICD-10-CM | POA: Diagnosis not present

## 2021-11-22 NOTE — Patient Instructions (Signed)
Access Code: [email or web address] URL: [email or web address] Date: 11/22/2021 Prepared by: Vista Mink  Exercises Supine Quad Set - 3 x daily - 7 x weekly - 2 sets - 10 reps - 5 seconds hold Supine Heel Slide - 3 x daily - 7 x weekly - 2 sets - 10 reps - 3-5 seconds hold Supine Bridge - 3 x daily - 7 x weekly - 2 sets - 10 reps - 3 seconds hold Seated Knee Extension AROM - 3 x daily - 7 x weekly - 2 sets - 10 reps - 3-5 seconds hold Sit to Stand Without Arm Support - 1 x daily - 7 x weekly - 3 sets - 10 reps Seated Hamstring Stretch - 1 x daily - 7 x weekly - 3 reps - 30 hold Seated Knee Flexion Stretch - 1 x daily - 7 x weekly - 15 reps - 10 hold

## 2021-11-22 NOTE — Therapy (Signed)
[place], [place], [address] Phone: [phone]   Fax:  [phone]  Physical Therapy Treatment  Patient Details  Name: Bryan Thomas MRN: [id number] Date of Birth: 1941-01-19 Referring Provider (PT): Frankey Shown MD   Encounter Date: 11/22/2021   PT End of Session - 11/22/21 8756     Visit Number 13    Number of Visits 17    Date for PT Re-Evaluation 11/30/21    Authorization Type PN sent on 11/07/2021 at 10th visit    Progress Note Due on Visit 20    PT Start Time 1017    PT Stop Time 1100    PT Time Calculation (min) 43 min    Activity Tolerance Patient tolerated treatment well    Behavior During Therapy [facility] for tasks assessed/performed             Past Medical History:  Diagnosis Date   Generalized osteoarthritis of multiple sites     Past Surgical History:  Procedure Laterality Date   KNEE ARTHROSCOPY Right 06/21/1989   LUMBAR [place] SURGERY  06/21/1969   TONSILLECTOMY     removed as a teenager   TOTAL KNEE ARTHROPLASTY Right 09/17/2021   Procedure: RIGHT TOTAL KNEE ARTHROPLASTY;  Surgeon: Leandrew Koyanagi, MD;  Location: [place];  Service: Orthopedics;  Laterality: Right;    There were no vitals filed for this visit.   Subjective Assessment - 11/22/21 1026     Subjective Olivier reports he has not taken pain medication in several weeks.  He is interested in getting more knee flexion to make it easier to get his socks on.    Pertinent History PMH:  right knee arthroscopy 1990, lumbar disc surgery 1970    How long can you sit comfortably? 30-60 minutes    How long can you stand comfortably? 10-15 minutes    How long can you walk comfortably? 20-30 minutes    Patient Stated Goals Walk without walker, stop hurting, work in shop again    Currently in Pain? Yes    Pain Score 4     Pain Location Knee    Pain Orientation Right    Pain Descriptors / Indicators Tightness;Aching    Pain Type Surgical pain     Pain Radiating Towards Knee    Pain Onset More than a month ago    Pain Frequency Intermittent    Aggravating Factors  Standing still    Pain Relieving Factors Movement and change of position    Effect of Pain on Daily Activities Limits prolonged standing and difficulty with socks    Multiple Pain Sites No                               [facility] Adult PT Treatment/Exercise - 11/22/21 0001       Neuro Re-ed    Neuro Re-ed Details  Tandem balance: eyes open, on foam; closed 3X 20 seconds each      Knee/Hip Exercises: Aerobic   Recumbent Bike Seat 10 and Seat 8 for 4 minutes each Level 2      Knee/Hip Exercises: Machines for Strengthening   Cybex Leg Press 100# 15X double leg and 50# 10X single leg slow eccentrics, stretch into flexion and extension      Knee/Hip Exercises: Seated   Other Seated Knee/Hip Exercises Tailgate knee flexion 3 minutes and with overpressure 10X 10 seconds    Sit to  Sand 10 reps;without UE support;Other (comment)   slow eccentrics     Knee/Hip Exercises: Supine   Quad Sets Strengthening;Both;1 set;10 reps;Limitations    Quad Sets Limitations 5 seconds    Bridges Strengthening;10 reps;Limitations    Bridges Limitations 5 seconds                     PT Education - 11/22/21 1749     Education Details Reviewed HEP with progression of balance activities.    Person(s) Educated Patient    Methods Explanation;Demonstration;Verbal cues;Handout    Comprehension Verbalized understanding;Returned demonstration;Verbal cues required;Need further instruction              PT Short Term Goals - 10/29/21 1110       PT SHORT TERM GOAL #1   Title Pt will be independent in his initial HEP    Status Achieved      PT SHORT TERM GOAL #2   Title Pt will improve 5 time sit to stand to </= 20 seconds with UE support    Baseline 20 seconds with UE support on 10/29/2021    Status Achieved               PT Long Term Goals -  11/20/21 1353       PT LONG TERM GOAL #1   Title Pt will be indepdent in his advanced HEP    Status On-going      PT LONG TERM GOAL #2   Title Pt will be able to amb with no device on level surfaces for 1000 feet using step through gait pattern safely.    Status On-going      PT LONG TERM GOAL #3   Title Pt will be able to climb 1 flight of stairs with single hand rail with step over step pattern with pain </= 3/10.    Status On-going      PT LONG TERM GOAL #4   Title pt will improve his right knee AROM to >/= 120 degrees in order to improve funcitonal mobility with pain </= 3/10.    Status On-going      PT LONG TERM GOAL #5   Title Pt will improve his FOTO to >/= 53 %    Baseline 31% on 10/03/2021, 52% on 11/07/2021 at 10th visit    Status On-going                   Plan - 11/22/21 1749     Clinical Impression Statement Abhijot is progressing nicely post-TKA.  He would like to continue to get stronger and bend better for socks and shoes.  We reinforced strength and flexion activities with his HEP.  Continue POC to meet remaining LTGs.    Personal Factors and Comorbidities Comorbidity 3+    Comorbidities right knee arthroscopy 1990, lumbar disc surgery 1970    Examination-Activity Limitations Lift;Transfers;Squat;Stairs;Stand;Dressing;Sit    Examination-Participation Restrictions Community Activity;Other;Yard Work    Stability/Clinical Decision Making Stable/Uncomplicated    Rehab Potential Good    PT Frequency 2x / week    PT Duration 8 weeks    PT Treatment/Interventions ADLs/Self Care Home Management;Vasopneumatic Device;Taping;Cryotherapy;Counsellor;Therapeutic exercise;Therapeutic activities;Functional mobility training;Stair training;Gait training;Neuromuscular re-education;Patient/family education;Passive range of motion;Scar mobilization;Manual techniques    PT Next Visit Plan Knee flexion for socks, quadriceps strength,  dynamic gait and balance, stairs    PT Home Exercise Plan Access Code: [email or web address]    Consulted and Agree with Plan of Care Patient  Patient will benefit from skilled therapeutic intervention in order to improve the following deficits and impairments:  Pain, Difficulty walking, Decreased activity tolerance, Decreased balance, Decreased mobility, Decreased strength, Increased edema, Impaired flexibility  Visit Diagnosis: Difficulty in walking, not elsewhere classified  Muscle weakness (generalized)  Localized edema  Stiffness of right knee, not elsewhere classified  Acute pain of right knee     Problem List Patient Active Problem List   Diagnosis Date Noted   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   Pre-op examination 09/10/2021   Cough 09/10/2021   Primary osteoarthritis of left knee 08/07/2021   Primary osteoarthritis of right knee 08/07/2021   Sensory loss 02/13/2016   Preventative health care 02/13/2016   Advance directive discussed with patient 02/13/2016   Generalized osteoarthritis of multiple sites     [place], [place], MPT 11/22/2021, 5:51 PM  [facility] Physical Therapy [address] [place], [place], [address] Phone: [phone]   Fax:  [phone]  Name: VIHAN SANTAGATA MRN: [id number] Date of Birth: 1941/04/12

## 2021-11-28 ENCOUNTER — Other Ambulatory Visit: Payer: Self-pay

## 2021-11-28 ENCOUNTER — Ambulatory Visit (INDEPENDENT_AMBULATORY_CARE_PROVIDER_SITE_OTHER): Payer: PPO | Admitting: Physical Therapy

## 2021-11-28 ENCOUNTER — Encounter: Payer: Self-pay | Admitting: Physical Therapy

## 2021-11-28 DIAGNOSIS — R600 Localized edema: Secondary | ICD-10-CM | POA: Diagnosis not present

## 2021-11-28 DIAGNOSIS — M6281 Muscle weakness (generalized): Secondary | ICD-10-CM | POA: Diagnosis not present

## 2021-11-28 DIAGNOSIS — M25661 Stiffness of right knee, not elsewhere classified: Secondary | ICD-10-CM | POA: Diagnosis not present

## 2021-11-28 DIAGNOSIS — R262 Difficulty in walking, not elsewhere classified: Secondary | ICD-10-CM

## 2021-11-28 DIAGNOSIS — M25561 Pain in right knee: Secondary | ICD-10-CM | POA: Diagnosis not present

## 2021-11-28 NOTE — Therapy (Signed)
[place], [place], [address] Phone: [phone]   Fax:  [phone]  Physical Therapy Treatment Recertification  Patient Details  Name: Bryan Thomas MRN: [id number] Date of Birth: 1941/04/11 Referring Provider (PT): Frankey Shown MD   Encounter Date: 11/28/2021   PT End of Session - 11/28/21 1055     Visit Number 14    Number of Visits 23    Date for PT Re-Evaluation 11/30/21    Authorization Type PN sent on 11/07/2021 at 10th visit    Authorization Time Period Recert on 11/21/9756 for 4 more weeks beginning 12/03/2021    Progress Note Due on Visit 20    PT Start Time 1100    PT Stop Time 1150    PT Time Calculation (min) 50 min    Activity Tolerance Patient tolerated treatment well    Behavior During Therapy [facility] for tasks assessed/performed             Past Medical History:  Diagnosis Date   Generalized osteoarthritis of multiple sites     Past Surgical History:  Procedure Laterality Date   KNEE ARTHROSCOPY Right 06/21/1989   LUMBAR [place] SURGERY  06/21/1969   TONSILLECTOMY     removed as a teenager   TOTAL KNEE ARTHROPLASTY Right 09/17/2021   Procedure: RIGHT TOTAL KNEE ARTHROPLASTY;  Surgeon: Leandrew Koyanagi, MD;  Location: [place];  Service: Orthopedics;  Laterality: Right;    There were no vitals filed for this visit.   Subjective Assessment - 11/28/21 1100     Subjective Pt arrivng today reporting pain of 4/10 in his right knee across joint line.    Pertinent History PMH:  right knee arthroscopy 1990, lumbar disc surgery 1970    Limitations House hold activities;Walking    How long can you sit comfortably? 30-60 minutes    How long can you stand comfortably? 10-15 minutes    How long can you walk comfortably? 20-30 minutes    Currently in Pain? Yes    Pain Score 3     Pain Location Knee    Pain Orientation Right    Pain Descriptors / Indicators Aching;Sore    Pain Type Surgical pain    Pain  Onset More than a month ago                [facility] PT Assessment - 11/28/21 0001       Assessment   Medical Diagnosis right TKA M17.11    Referring Provider (PT) Frankey Shown MD      AROM   Right Knee Extension -5    Right Knee Flexion 110      PROM   Right Knee Extension -4    Right Knee Flexion 115                           [facility] Adult PT Treatment/Exercise - 11/28/21 0001       Knee/Hip Exercises: Aerobic   Recumbent Bike seat 9 L4 x 8 minutes      Knee/Hip Exercises: Machines for Strengthening   Cybex Leg Press 100# 15X double leg and 75# 10X single leg slow eccentrics, stretch into flexion and extension      Knee/Hip Exercises: Standing   Other Standing Knee Exercises mini squats in parallel bars x 10 with UE support    Other Standing Knee Exercises up and down 1 flight of steps with bilateral UE support with step  over step pattern with close supervision      Knee/Hip Exercises: Seated   Other Seated Knee/Hip Exercises working on crossing legs to minic donning shoes x 10 using a towel around right ankle for assisance      Vasopneumatic   Number Minutes Vasopneumatic  10 minutes    Vasopnuematic Location  Knee    Vasopneumatic Pressure Medium    Vasopneumatic Temperature  34      Manual Therapy   Manual therapy comments seated knee flexion with IR                     PT Education - 11/28/21 1121     Education Details Discussed extending pt's POC.    Person(s) Educated Patient    Methods Explanation    Comprehension Verbalized understanding              PT Short Term Goals - 11/28/21 1121       PT SHORT TERM GOAL #1   Title Pt will be independent in his initial HEP    Status Achieved      PT SHORT TERM GOAL #2   Title Pt will improve 5 time sit to stand to </= 20 seconds with UE support    Status Achieved               PT Long Term Goals - 11/28/21 1114       PT LONG TERM GOAL #1   Title Pt will be  indepdent in his advanced HEP    Status Not Met      PT LONG TERM GOAL #2   Title Pt will be able to amb with no device on level surfaces for 1000 feet using step through gait pattern safely.    Baseline amb 1000 feet on level surfaces with mild antalgic gait with no device.    Status Partially Met      PT LONG TERM GOAL #3   Title Pt will be able to climb 1 flight of stairs with single hand rail with step over step pattern with pain </= 3/10.    Baseline Pt requiring bilateral UE support using step through gait pattern with pain of 4-5/10.    Status Not Met      PT LONG TERM GOAL #4   Title pt will improve his right knee AROM to >/= 120 degrees in order to improve funcitonal mobility with pain </= 3/10.    Status Not Met      PT LONG TERM GOAL #5   Title Pt will improve his FOTO to >/= 53 %    Baseline 31% on 10/03/2021, 52% on 11/07/2021 at 10th visit    Status Not Met                   Plan - 11/28/21 1117     Clinical Impression Statement Pt is making progress with LE strengthening. Still needs work for improvements in active ROM and functional mobility. Pt still progressing toward all his LTG's. I am requesting 4 additional weeks of skilled  therapy to maximize pt's function.    Personal Factors and Comorbidities Comorbidity 3+    Comorbidities right knee arthroscopy 1990, lumbar disc surgery 1970    Examination-Participation Restrictions Community Activity;Other;Yard Work    Stability/Clinical Decision Making Stable/Uncomplicated    Rehab Potential Good    PT Frequency 2x / week    PT Duration --   4 weeks starting on 12/03/2021  PT Treatment/Interventions ADLs/Self Care Home Management;Vasopneumatic Device;Taping;Cryotherapy;Counsellor;Therapeutic exercise;Therapeutic activities;Functional mobility training;Stair training;Gait training;Neuromuscular re-education;Patient/family education;Passive range of motion;Scar  mobilization;Manual techniques    PT Next Visit Plan Conitnue with knee flexion for donning/doffing socks, quadriceps strength, dynamic gait and balance, stairs    PT Home Exercise Plan Access Code: [email or web address]    Consulted and Agree with Plan of Care Patient    Family Member Consulted daughter, [name]             Patient will benefit from skilled therapeutic intervention in order to improve the following deficits and impairments:  Pain, Difficulty walking, Decreased activity tolerance, Decreased balance, Decreased mobility, Decreased strength, Increased edema, Impaired flexibility  Visit Diagnosis: Difficulty in walking, not elsewhere classified  Muscle weakness (generalized)  Localized edema  Stiffness of right knee, not elsewhere classified  Acute pain of right knee     Problem List Patient Active Problem List   Diagnosis Date Noted   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   Pre-op examination 09/10/2021   Cough 09/10/2021   Primary osteoarthritis of left knee 08/07/2021   Primary osteoarthritis of right knee 08/07/2021   Sensory loss 02/13/2016   Preventative health care 02/13/2016   Advance directive discussed with patient 02/13/2016   Generalized osteoarthritis of multiple sites     Oretha Caprice, PT, MPT 11/28/2021, 12:02 PM  [facility] Physical Therapy [address] [place], [place], [address] Phone: [phone]   Fax:  [phone]  Name: Bryan Thomas MRN: [id number] Date of Birth: 1941/05/13

## 2021-11-30 ENCOUNTER — Other Ambulatory Visit: Payer: Self-pay

## 2021-11-30 ENCOUNTER — Encounter: Payer: Self-pay | Admitting: Physical Therapy

## 2021-11-30 ENCOUNTER — Ambulatory Visit (INDEPENDENT_AMBULATORY_CARE_PROVIDER_SITE_OTHER): Payer: PPO | Admitting: Physical Therapy

## 2021-11-30 DIAGNOSIS — M6281 Muscle weakness (generalized): Secondary | ICD-10-CM

## 2021-11-30 DIAGNOSIS — M25661 Stiffness of right knee, not elsewhere classified: Secondary | ICD-10-CM | POA: Diagnosis not present

## 2021-11-30 DIAGNOSIS — R600 Localized edema: Secondary | ICD-10-CM

## 2021-11-30 DIAGNOSIS — R262 Difficulty in walking, not elsewhere classified: Secondary | ICD-10-CM

## 2021-11-30 DIAGNOSIS — M25561 Pain in right knee: Secondary | ICD-10-CM | POA: Diagnosis not present

## 2021-11-30 NOTE — Therapy (Signed)
[place], [place], [address] Phone: [phone]   Fax:  [phone]  Physical Therapy Treatment  Patient Details  Name: Bryan Thomas MRN: [id number] Date of Birth: 03/24/1941 Referring Provider (PT): Frankey Shown MD   Encounter Date: 11/30/2021   PT End of Session - 11/30/21 1056     Visit Number 15    Number of Visits 23    Date for PT Re-Evaluation 11/30/21    Authorization Type PN sent on 11/07/2021 at 10th visit    Authorization Time Period Recert on 12/24/7015 for 4 more weeks beginning 12/03/2021    Progress Note Due on Visit 20    PT Start Time 1055    PT Stop Time 1133    PT Time Calculation (min) 38 min    Activity Tolerance Patient tolerated treatment well    Behavior During Therapy [facility] for tasks assessed/performed             Past Medical History:  Diagnosis Date   Generalized osteoarthritis of multiple sites     Past Surgical History:  Procedure Laterality Date   KNEE ARTHROSCOPY Right 06/21/1989   LUMBAR [place] SURGERY  06/21/1969   TONSILLECTOMY     removed as a teenager   TOTAL KNEE ARTHROPLASTY Right 09/17/2021   Procedure: RIGHT TOTAL KNEE ARTHROPLASTY;  Surgeon: Leandrew Koyanagi, MD;  Location: [place];  Service: Orthopedics;  Laterality: Right;    There were no vitals filed for this visit.   Subjective Assessment - 11/30/21 1057     Subjective doing well, knee pain is about the same    Pertinent History PMH:  right knee arthroscopy 1990, lumbar disc surgery 1970    Limitations House hold activities;Walking    How long can you sit comfortably? 30-60 minutes    How long can you stand comfortably? 10-15 minutes    How long can you walk comfortably? 20-30 minutes    Currently in Pain? Yes    Pain Score 3     Pain Location Knee    Pain Orientation Right    Pain Descriptors / Indicators Aching;Sore    Pain Type Surgical pain    Pain Onset More than a month ago    Pain Frequency  Intermittent    Aggravating Factors  standing still    Pain Relieving Factors movement and changing positions                               [facility] Adult PT Treatment/Exercise - 11/30/21 1058       Knee/Hip Exercises: Aerobic   Recumbent Bike seat 9 L4 x 8 minutes      Knee/Hip Exercises: Machines for Strengthening   Cybex Knee Extension 10# RLE only 3x10    Cybex Leg Press 100# 2x15 double leg, then RLE only 100# 2x10      Knee/Hip Exercises: Standing   Heel Raises Both;20 reps    Forward Step Up Both;2 sets;10 reps;Hand Hold: 1;Step Height: 6"    Step Down Limitations heel tap with RLE on 4" step 2x10                       PT Short Term Goals - 11/28/21 1121       PT SHORT TERM GOAL #1   Title Pt will be independent in his initial HEP    Status Achieved  PT SHORT TERM GOAL #2   Title Pt will improve 5 time sit to stand to </= 20 seconds with UE support    Status Achieved               PT Long Term Goals - 11/28/21 1114       PT LONG TERM GOAL #1   Title Pt will be indepdent in his advanced HEP    Status Not Met      PT LONG TERM GOAL #2   Title Pt will be able to amb with no device on level surfaces for 1000 feet using step through gait pattern safely.    Baseline amb 1000 feet on level surfaces with mild antalgic gait with no device.    Status Partially Met      PT LONG TERM GOAL #3   Title Pt will be able to climb 1 flight of stairs with single hand rail with step over step pattern with pain </= 3/10.    Baseline Pt requiring bilateral UE support using step through gait pattern with pain of 4-5/10.    Status Not Met      PT LONG TERM GOAL #4   Title pt will improve his right knee AROM to >/= 120 degrees in order to improve funcitonal mobility with pain </= 3/10.    Status Not Met      PT LONG TERM GOAL #5   Title Pt will improve his FOTO to >/= 53 %    Baseline 31% on 10/03/2021, 52% on 11/07/2021 at 10th visit     Status Not Met                   Plan - 11/30/21 1129     Clinical Impression Statement Pt tolerated session well today with continued focus on LE strengthening as well as eccentric control and strength needed for stairs.  Will continue to benefit from PT to maximize function.    Personal Factors and Comorbidities Comorbidity 3+    Comorbidities right knee arthroscopy 1990, lumbar disc surgery 1970    Examination-Participation Restrictions Community Activity;Other;Yard Work    Stability/Clinical Decision Making Stable/Uncomplicated    Rehab Potential Good    PT Frequency 2x / week    PT Duration --   4 weeks starting on 12/03/2021   PT Treatment/Interventions ADLs/Self Care Home Management;Vasopneumatic Device;Taping;Cryotherapy;Counsellor;Therapeutic exercise;Therapeutic activities;Functional mobility training;Stair training;Gait training;Neuromuscular re-education;Patient/family education;Passive range of motion;Scar mobilization;Manual techniques    PT Next Visit Plan Conitnue with knee flexion for donning/doffing socks, quadriceps strength, dynamic gait and balance, stairs    PT Home Exercise Plan Access Code: [email or web address]    Consulted and Agree with Plan of Care Patient    Family Member Consulted --             Patient will benefit from skilled therapeutic intervention in order to improve the following deficits and impairments:  Pain, Difficulty walking, Decreased activity tolerance, Decreased balance, Decreased mobility, Decreased strength, Increased edema, Impaired flexibility  Visit Diagnosis: Difficulty in walking, not elsewhere classified  Muscle weakness (generalized)  Localized edema  Stiffness of right knee, not elsewhere classified  Acute pain of right knee     Problem List Patient Active Problem List   Diagnosis Date Noted   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   Pre-op examination 09/10/2021    Cough 09/10/2021   Primary osteoarthritis of left knee 08/07/2021   Primary osteoarthritis of right knee 08/07/2021   Sensory  loss 02/13/2016   Preventative health care 02/13/2016   Advance directive discussed with patient 02/13/2016   Generalized osteoarthritis of multiple sites      Laureen Abrahams, PT, DPT 11/30/21 11:39 AM      [facility] Physical Therapy [address] [place], [place], [address] Phone: [phone]   Fax:  [phone]  Name: Bryan Thomas MRN: [id number] Date of Birth: 1941-04-08

## 2021-12-05 ENCOUNTER — Other Ambulatory Visit: Payer: Self-pay

## 2021-12-05 ENCOUNTER — Encounter: Payer: Self-pay | Admitting: Physical Therapy

## 2021-12-05 ENCOUNTER — Ambulatory Visit (INDEPENDENT_AMBULATORY_CARE_PROVIDER_SITE_OTHER): Payer: PPO | Admitting: Physical Therapy

## 2021-12-05 DIAGNOSIS — M6281 Muscle weakness (generalized): Secondary | ICD-10-CM

## 2021-12-05 DIAGNOSIS — M25661 Stiffness of right knee, not elsewhere classified: Secondary | ICD-10-CM

## 2021-12-05 DIAGNOSIS — R600 Localized edema: Secondary | ICD-10-CM

## 2021-12-05 DIAGNOSIS — M25561 Pain in right knee: Secondary | ICD-10-CM

## 2021-12-05 DIAGNOSIS — R262 Difficulty in walking, not elsewhere classified: Secondary | ICD-10-CM

## 2021-12-05 NOTE — Therapy (Signed)
[place], [place], [address] Phone: [phone]   Fax:  [phone]  Physical Therapy Treatment  Patient Details  Name: Bryan Thomas MRN: [id number] Date of Birth: 08-Jan-1941 Referring Provider (PT): Frankey Shown MD   Encounter Date: 12/05/2021   PT End of Session - 12/05/21 0849     Visit Number 16    Number of Visits 23    Date for PT Re-Evaluation 11/30/21    Authorization Type PN sent on 11/07/2021 at 10th visit    Authorization Time Period Recert on 05/29/8915 for 4 more weeks beginning 12/03/2021    Progress Note Due on Visit 20    PT Start Time 0847    PT Stop Time 0925    PT Time Calculation (min) 38 min    Activity Tolerance Patient tolerated treatment well    Behavior During Therapy [facility] for tasks assessed/performed             Past Medical History:  Diagnosis Date   Generalized osteoarthritis of multiple sites     Past Surgical History:  Procedure Laterality Date   KNEE ARTHROSCOPY Right 06/21/1989   LUMBAR [place] SURGERY  06/21/1969   TONSILLECTOMY     removed as a teenager   TOTAL KNEE ARTHROPLASTY Right 09/17/2021   Procedure: RIGHT TOTAL KNEE ARTHROPLASTY;  Surgeon: Leandrew Koyanagi, MD;  Location: [place];  Service: Orthopedics;  Laterality: Right;    There were no vitals filed for this visit.   Subjective Assessment - 12/05/21 0850     Subjective doing well; knee has been doing well the past few days    Pertinent History PMH:  right knee arthroscopy 1990, lumbar disc surgery 1970    Limitations House hold activities;Walking    How long can you sit comfortably? 30-60 minutes    How long can you stand comfortably? 10-15 minutes    How long can you walk comfortably? 20-30 minutes    Patient Stated Goals Walk without walker, stop hurting, work in shop again    Currently in Pain? No/denies                               [facility] Adult PT Treatment/Exercise - 12/05/21 0851        Knee/Hip Exercises: Aerobic   Nustep L7 x 8 min      Knee/Hip Exercises: Machines for Strengthening   Cybex Knee Extension 10# RLE only 3x10    Cybex Leg Press 100# RLE only 2x15      Knee/Hip Exercises: Standing   Step Down Limitations heel tap with RLE on 4" step 3x10; 1UE support      Knee/Hip Exercises: Seated   Sit to Sand 10 reps;without UE support   cues for eccentric control                Balance Exercises - 12/05/21 0906       Balance Exercises: Standing   Balance Beam in // bars with intermittent UE support: sidestepping x 5 laps, tandem gait fwd x 5 laps                  PT Short Term Goals - 11/28/21 1121       PT SHORT TERM GOAL #1   Title Pt will be independent in his initial HEP    Status Achieved      PT SHORT TERM GOAL #2   Title  Pt will improve 5 time sit to stand to </= 20 seconds with UE support    Status Achieved               PT Long Term Goals - 11/28/21 1114       PT LONG TERM GOAL #1   Title Pt will be indepdent in his advanced HEP    Status Not Met      PT LONG TERM GOAL #2   Title Pt will be able to amb with no device on level surfaces for 1000 feet using step through gait pattern safely.    Baseline amb 1000 feet on level surfaces with mild antalgic gait with no device.    Status Partially Met      PT LONG TERM GOAL #3   Title Pt will be able to climb 1 flight of stairs with single hand rail with step over step pattern with pain </= 3/10.    Baseline Pt requiring bilateral UE support using step through gait pattern with pain of 4-5/10.    Status Not Met      PT LONG TERM GOAL #4   Title pt will improve his right knee AROM to >/= 120 degrees in order to improve funcitonal mobility with pain </= 3/10.    Status Not Met      PT LONG TERM GOAL #5   Title Pt will improve his FOTO to >/= 53 %    Baseline 31% on 10/03/2021, 52% on 11/07/2021 at 10th visit    Status Not Met                   Plan  - 12/05/21 3009     Clinical Impression Statement Continued focus on balance and RLE strengthening with eccentric focus.  Will continue to benefit from PT to maximize function.  Anticipate we are nearing d/c next few weeks.    Personal Factors and Comorbidities Comorbidity 3+    Comorbidities right knee arthroscopy 1990, lumbar disc surgery 1970    Examination-Participation Restrictions Community Activity;Other;Yard Work    Stability/Clinical Decision Making Stable/Uncomplicated    Rehab Potential Good    PT Frequency 2x / week    PT Duration --   4 weeks starting on 12/03/2021   PT Treatment/Interventions ADLs/Self Care Home Management;Vasopneumatic Device;Taping;Cryotherapy;Counsellor;Therapeutic exercise;Therapeutic activities;Functional mobility training;Stair training;Gait training;Neuromuscular re-education;Patient/family education;Passive range of motion;Scar mobilization;Manual techniques    PT Next Visit Plan Conitnue with knee flexion for donning/doffing socks, quadriceps strength, dynamic gait and balance, stairs    PT Home Exercise Plan Access Code: [email or web address]    Consulted and Agree with Plan of Care Patient             Patient will benefit from skilled therapeutic intervention in order to improve the following deficits and impairments:  Pain, Difficulty walking, Decreased activity tolerance, Decreased balance, Decreased mobility, Decreased strength, Increased edema, Impaired flexibility  Visit Diagnosis: Difficulty in walking, not elsewhere classified  Muscle weakness (generalized)  Localized edema  Stiffness of right knee, not elsewhere classified  Acute pain of right knee     Problem List Patient Active Problem List   Diagnosis Date Noted   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   Pre-op examination 09/10/2021   Cough 09/10/2021   Primary osteoarthritis of left knee 08/07/2021   Primary osteoarthritis of  right knee 08/07/2021   Sensory loss 02/13/2016   Preventative health care 02/13/2016   Advance directive discussed with patient 02/13/2016  Generalized osteoarthritis of multiple sites       Laureen Abrahams, PT, DPT 12/05/21 9:25 AM     [facility] Physical Therapy [address] [place], [place], [address] Phone: [phone]   Fax:  [phone]  Name: Bryan Thomas MRN: [id number] Date of Birth: 03-05-1941

## 2021-12-10 ENCOUNTER — Other Ambulatory Visit: Payer: Self-pay

## 2021-12-10 ENCOUNTER — Ambulatory Visit (INDEPENDENT_AMBULATORY_CARE_PROVIDER_SITE_OTHER): Payer: PPO | Admitting: Physical Therapy

## 2021-12-10 ENCOUNTER — Encounter: Payer: Self-pay | Admitting: Physical Therapy

## 2021-12-10 DIAGNOSIS — M25561 Pain in right knee: Secondary | ICD-10-CM

## 2021-12-10 DIAGNOSIS — M25661 Stiffness of right knee, not elsewhere classified: Secondary | ICD-10-CM

## 2021-12-10 DIAGNOSIS — M6281 Muscle weakness (generalized): Secondary | ICD-10-CM

## 2021-12-10 DIAGNOSIS — R600 Localized edema: Secondary | ICD-10-CM

## 2021-12-10 DIAGNOSIS — R262 Difficulty in walking, not elsewhere classified: Secondary | ICD-10-CM | POA: Diagnosis not present

## 2021-12-10 NOTE — Therapy (Signed)
[place], [place], [address] Phone: [phone]   Fax:  [phone]  Physical Therapy Treatment Progress Note  Patient Details  Name: Bryan Thomas MRN: [id number] Date of Birth: 06-15-1941 Referring Provider (PT): Frankey Shown MD  Progress Note Reporting Period 11/07/2021 to 12/10/2021   See note below for Objective Data and Assessment of Progress/Goals.     Encounter Date: 12/10/2021   PT End of Session - 12/10/21 1305     Visit Number 17    Number of Visits 23    Date for PT Re-Evaluation 11/30/21    Authorization Type PN sent on 11/07/2021 at 10th visit, PN sent on 12/10/2021    Authorization Time Period Recert on 05/21/4480 for 4 more weeks beginning 12/03/2021    Progress Note Due on Visit 27    PT Start Time 1300    PT Stop Time 1340    PT Time Calculation (min) 40 min    Activity Tolerance Patient tolerated treatment well    Behavior During Therapy [facility] for tasks assessed/performed             Past Medical History:  Diagnosis Date   Generalized osteoarthritis of multiple sites     Past Surgical History:  Procedure Laterality Date   KNEE ARTHROSCOPY Right 06/21/1989   LUMBAR [place] SURGERY  06/21/1969   TONSILLECTOMY     removed as a teenager   TOTAL KNEE ARTHROPLASTY Right 09/17/2021   Procedure: RIGHT TOTAL KNEE ARTHROPLASTY;  Surgeon: Leandrew Koyanagi, MD;  Location: [place];  Service: Orthopedics;  Laterality: Right;    There were no vitals filed for this visit.   Subjective Assessment - 12/10/21 1307     Subjective Pt arriving today reporting no pain. Pt stating his weekend went "pretty good".    Pertinent History PMH:  right knee arthroscopy 1990, lumbar disc surgery 1970    Limitations House hold activities;Walking    Patient Stated Goals Walk without walker, stop hurting, work in shop again    Currently in Pain? No/denies                [facility] PT Assessment - 12/10/21 0001        Assessment   Medical Diagnosis right TKA M17.11    Referring Provider (PT) Frankey Shown MD      AROM   Right Knee Extension -5    Right Knee Flexion 112      PROM   Right Knee Extension -4    Right Knee Flexion 115                           [facility] Adult PT Treatment/Exercise - 12/10/21 0001       Knee/Hip Exercises: Aerobic   Recumbent Bike seat 7 L4 x 8 minutes      Knee/Hip Exercises: Machines for Strengthening   Cybex Knee Extension 10# RLE only 3x10    Cybex Leg Press 100# RLE only 2x15      Knee/Hip Exercises: Standing   Step Down Limitations heel tap with RLE on 6" step 2 x10; 1UE support                       PT Short Term Goals - 12/10/21 1315       PT SHORT TERM GOAL #1   Title Pt will be independent in his initial HEP    Status Achieved  PT SHORT TERM GOAL #2   Title Pt will improve 5 time sit to stand to </= 20 seconds with UE support    Status Achieved               PT Long Term Goals - 12/10/21 1315       PT LONG TERM GOAL #1   Title Pt will be indepdent in his advanced HEP    Status On-going      PT LONG TERM GOAL #2   Title Pt will be able to amb with no device on level surfaces for 1000 feet using step through gait pattern safely.    Status Achieved      PT LONG TERM GOAL #3   Title Pt will be able to climb 1 flight of stairs with single hand rail with step over step pattern with pain </= 3/10.    Status On-going      PT LONG TERM GOAL #4   Title pt will improve his right knee AROM to >/= 120 degrees in order to improve funcitonal mobility with pain </= 3/10.    Status On-going      PT LONG TERM GOAL #5   Title Pt will improve his FOTO to >/= 53 %    Baseline 31% on 10/03/2021, 52% on 11/07/2021 at 10th visit, 12/10/2021 57% at 17th visit    Status Achieved                   Plan - 12/10/21 1308     Clinical Impression Statement Pt arriving today reporting no pain. Pt still amb with  analgic gait pattern with decreased left knee flexion. AROM arc measures 5-112 degrees, PROM arc measures 4-115. Pt still progressing toward improved functional activities like dressing and improved strength. Recommending continue skilled PT to progress toward all goals set.    Personal Factors and Comorbidities Comorbidity 3+    Comorbidities right knee arthroscopy 1990, lumbar disc surgery 1970    Examination-Activity Limitations Lift;Transfers;Squat;Stairs;Stand;Dressing;Sit    Examination-Participation Restrictions Community Activity;Other;Yard Work    Stability/Clinical Decision Making Stable/Uncomplicated    Rehab Potential Good    PT Frequency 2x / week    PT Treatment/Interventions ADLs/Self Care Home Management;Vasopneumatic Device;Taping;Cryotherapy;Counsellor;Therapeutic exercise;Therapeutic activities;Functional mobility training;Stair training;Gait training;Neuromuscular re-education;Patient/family education;Passive range of motion;Scar mobilization;Manual techniques    PT Next Visit Plan Conitnue with knee flexion for donning/doffing socks, quadriceps strength, dynamic gait and balance, stairs    PT Home Exercise Plan Access Code: [email or web address]    Consulted and Agree with Plan of Care Patient             Patient will benefit from skilled therapeutic intervention in order to improve the following deficits and impairments:  Pain, Difficulty walking, Decreased activity tolerance, Decreased balance, Decreased mobility, Decreased strength, Increased edema, Impaired flexibility  Visit Diagnosis: Difficulty in walking, not elsewhere classified  Muscle weakness (generalized)  Localized edema  Stiffness of right knee, not elsewhere classified  Acute pain of right knee     Problem List Patient Active Problem List   Diagnosis Date Noted   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   Pre-op examination 09/10/2021   Cough 09/10/2021    Primary osteoarthritis of left knee 08/07/2021   Primary osteoarthritis of right knee 08/07/2021   Sensory loss 02/13/2016   Preventative health care 02/13/2016   Advance directive discussed with patient 02/13/2016   Generalized osteoarthritis of multiple sites     Oretha Caprice,  PT, MPT 12/10/2021, 1:26 PM  [facility] Physical Therapy [address] [place], [place], [address] Phone: [phone]   Fax:  [phone]  Name: Bryan Thomas MRN: [id number] Date of Birth: 1941-05-11

## 2021-12-11 ENCOUNTER — Ambulatory Visit (INDEPENDENT_AMBULATORY_CARE_PROVIDER_SITE_OTHER): Payer: PPO | Admitting: Orthopaedic Surgery

## 2021-12-11 ENCOUNTER — Encounter: Payer: Self-pay | Admitting: Orthopaedic Surgery

## 2021-12-11 DIAGNOSIS — Z96651 Presence of right artificial knee joint: Secondary | ICD-10-CM

## 2021-12-11 MED ORDER — AMOXICILLIN 500 MG PO CAPS: 2000.0000 mg | ORAL_CAPSULE | Freq: Once | ORAL | 6 refills | Status: AC

## 2021-12-11 NOTE — Progress Notes (Signed)
° °  Post-Op Visit Note   Patient: Bryan Thomas           Date of Birth: 09/14/1941           MRN: [id number] Visit Date: 12/11/2021 PCP: Venia Carbon, MD   Assessment & Plan:  Chief Complaint:  Chief Complaint  Patient presents with   Right Knee - Pain   Visit Diagnoses:  1. Status post total right knee replacement     Plan: Bryan Thomas is 3 months status post right total knee replacement.  He still has some mild discomfort with increased activity.  He is doing physical therapy twice a week.  No real complaints and overall doing okay.  Examination of the right knee shows a fully healed surgical scar.  He has good range of motion that is functional.  Stable to varus valgus stress.  Bryan Thomas has done well from his knee replacement.  Dental prophylaxis reinforced.  He understands to back off on activity when he has symptoms.  Recheck in 3 months with two-view x-rays of the right knee.  Follow-Up Instructions: Return in about 3 months (around 03/10/2022).   Orders:  No orders of the defined types were placed in this encounter.  No orders of the defined types were placed in this encounter.   Imaging: No results found.  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   Pre-op examination 09/10/2021   Cough 09/10/2021   Primary osteoarthritis of left knee 08/07/2021   Primary osteoarthritis of right knee 08/07/2021   Sensory loss 02/13/2016   Preventative health care 02/13/2016   Advance directive discussed with patient 02/13/2016   Generalized osteoarthritis of multiple sites    Past Medical History:  Diagnosis Date   Generalized osteoarthritis of multiple sites     Family History  Problem Relation Age of Onset   Cancer Mother    Diabetes Sister    Diabetes Brother    Heart disease Brother        CABG   Diabetes Brother    Diabetes Brother     Past Surgical History:  Procedure Laterality  Date   KNEE ARTHROSCOPY Right 06/21/1989   LUMBAR [place] SURGERY  06/21/1969   TONSILLECTOMY     removed as a teenager   TOTAL KNEE ARTHROPLASTY Right 09/17/2021   Procedure: RIGHT TOTAL KNEE ARTHROPLASTY;  Surgeon: Leandrew Koyanagi, MD;  Location: [place];  Service: Orthopedics;  Laterality: Right;   Social History   Occupational History   Occupation: TEFL teacher    Comment: tired  Tobacco Use   Smoking status: Former   Smokeless tobacco: Former    Types: Chew    Quit date: 08/21/2021  Vaping Use   Vaping Use: Never used  Substance and Sexual Activity   Alcohol use: No    Alcohol/week: 0.0 standard drinks   Drug use: Never   Sexual activity: Not on file

## 2021-12-12 ENCOUNTER — Encounter: Payer: Self-pay | Admitting: Physical Therapy

## 2021-12-12 ENCOUNTER — Other Ambulatory Visit: Payer: Self-pay

## 2021-12-12 ENCOUNTER — Ambulatory Visit (INDEPENDENT_AMBULATORY_CARE_PROVIDER_SITE_OTHER): Payer: PPO | Admitting: Physical Therapy

## 2021-12-12 DIAGNOSIS — M25561 Pain in right knee: Secondary | ICD-10-CM | POA: Diagnosis not present

## 2021-12-12 DIAGNOSIS — R600 Localized edema: Secondary | ICD-10-CM

## 2021-12-12 DIAGNOSIS — R262 Difficulty in walking, not elsewhere classified: Secondary | ICD-10-CM

## 2021-12-12 DIAGNOSIS — M25661 Stiffness of right knee, not elsewhere classified: Secondary | ICD-10-CM

## 2021-12-12 DIAGNOSIS — M6281 Muscle weakness (generalized): Secondary | ICD-10-CM | POA: Diagnosis not present

## 2021-12-12 NOTE — Therapy (Signed)
[place], [place], [address] Phone: [phone]   Fax:  [phone]  Physical Therapy Treatment  Patient Details  Name: Bryan Thomas MRN: [id number] Date of Birth: 07-09-1941 Referring Provider (PT): Frankey Shown MD   Encounter Date: 12/12/2021   PT End of Session - 12/12/21 0933     Visit Number 18    Number of Visits 23    Date for PT Re-Evaluation 11/30/21    Authorization Type PN sent on 11/07/2021 at 10th visit, PN sent on 12/10/2021    Authorization Time Period Recert on 06/25/7095 for 4 more weeks beginning 12/03/2021    Progress Note Due on Visit 27    PT Start Time 0930    PT Stop Time 1012    PT Time Calculation (min) 42 min    Activity Tolerance Patient tolerated treatment well    Behavior During Therapy [facility] for tasks assessed/performed             Past Medical History:  Diagnosis Date   Generalized osteoarthritis of multiple sites     Past Surgical History:  Procedure Laterality Date   KNEE ARTHROSCOPY Right 06/21/1989   LUMBAR [place] SURGERY  06/21/1969   TONSILLECTOMY     removed as a teenager   TOTAL KNEE ARTHROPLASTY Right 09/17/2021   Procedure: RIGHT TOTAL KNEE ARTHROPLASTY;  Surgeon: Leandrew Koyanagi, MD;  Location: [place];  Service: Orthopedics;  Laterality: Right;    There were no vitals filed for this visit.   Subjective Assessment - 12/12/21 0935     Subjective Pt arriving today reporting 4/10 pain in his right knee. Pt stating his left knee is giving him more problems than his right especially with stairs.    Currently in Pain? Yes    Pain Score 4     Pain Location Knee    Pain Orientation Right    Pain Descriptors / Indicators Aching;Sore    Pain Type Surgical pain    Pain Onset More than a month ago    Pain Frequency Intermittent                [facility] PT Assessment - 12/12/21 0001       Assessment   Medical Diagnosis right TKA M17.11    Referring Provider (PT) Frankey Shown MD      AROM   Right Knee Extension -6    Right Knee Flexion 112      PROM   Right Knee Extension -4    Right Knee Flexion 116   with pain noted at end range flexion                          [facility] Adult PT Treatment/Exercise - 12/12/21 0001       Therapeutic Activites    Therapeutic Activities Other Therapeutic Activities    Other Therapeutic Activities donning socks with attempt at using sock aid for Rt LE.      Neuro Re-ed    Neuro Re-ed Details  Standing cone taps on level surface and then on Airex mat x 20 each LE on each surface. Fitter balance board rocking side to side/balance x 2 minutes      Knee/Hip Exercises: Aerobic   Recumbent Bike seat 9  progressing to seat 7 L4 x 8 minutes      Knee/Hip Exercises: Machines for Strengthening   Cybex Leg Press 100# RLE only 2x15  Knee/Hip Exercises: Standing   Step Down Limitations up and down 1 flight of stairs with single hand rail with step over step pattern. Pt with tendency to circumduct his Rt LE to assist with step downs, pt needed verbal instructions to correct.                       PT Short Term Goals - 12/10/21 1315       PT SHORT TERM GOAL #1   Title Pt will be independent in his initial HEP    Status Achieved      PT SHORT TERM GOAL #2   Title Pt will improve 5 time sit to stand to </= 20 seconds with UE support    Status Achieved               PT Long Term Goals - 12/12/21 0959       PT LONG TERM GOAL #1   Title Pt will be indepdent in his advanced HEP    Status On-going      PT LONG TERM GOAL #2   Title Pt will be able to amb with no device on level surfaces for 1000 feet using step through gait pattern safely.    Status Achieved      PT LONG TERM GOAL #3   Title Pt will be able to climb 1 flight of stairs with single hand rail with step over step pattern with pain </= 3/10.    Status On-going      PT LONG TERM GOAL #4   Title pt will improve his  right knee AROM to >/= 120 degrees in order to improve funcitonal mobility with pain </= 3/10.    Status On-going      PT LONG TERM GOAL #5   Title Pt will improve his FOTO to >/= 53 %    Baseline 31% on 10/03/2021, 52% on 11/07/2021 at 10th visit, 12/10/2021 57% at 17th visit    Status Achieved                   Plan - 12/12/21 4656     Clinical Impression Statement Pt arriving to therapy reporting 3-4/10 pain in his right knee. Pt trying a sock aid today to assist with donning and doffing his socks to see if it would be something he would be able to utilize at home. The clinic sock aid was not wide enouhg for pt's foot to fit through so it was an unsuccessful attempt. Pt may need a larger aid. Pt was able to demonstrate donning/ dofficing right sock with difficulty and verbal cues for technique. Continue skilled PT progressing toward LTG's set.    Personal Factors and Comorbidities Comorbidity 3+    Comorbidities right knee arthroscopy 1990, lumbar disc surgery 1970    Examination-Activity Limitations Lift;Transfers;Squat;Stairs;Stand;Dressing;Sit    Examination-Participation Restrictions Community Activity;Other;Yard Work    Stability/Clinical Decision Making Stable/Uncomplicated    Rehab Potential Good    PT Frequency 2x / week    PT Treatment/Interventions ADLs/Self Care Home Management;Vasopneumatic Device;Taping;Cryotherapy;Counsellor;Therapeutic exercise;Therapeutic activities;Functional mobility training;Stair training;Gait training;Neuromuscular re-education;Patient/family education;Passive range of motion;Scar mobilization;Manual techniques    PT Next Visit Plan Conitnue with knee flexion for donning/doffing socks, quadriceps strength, dynamic gait and balance, stairs    PT Home Exercise Plan Access Code: [email or web address]    Consulted and Agree with Plan of Care Patient             Patient will  benefit from skilled therapeutic  intervention in order to improve the following deficits and impairments:  Pain, Difficulty walking, Decreased activity tolerance, Decreased balance, Decreased mobility, Decreased strength, Increased edema, Impaired flexibility  Visit Diagnosis: Difficulty in walking, not elsewhere classified  Muscle weakness (generalized)  Localized edema  Stiffness of right knee, not elsewhere classified  Acute pain of right knee     Problem List Patient Active Problem List   Diagnosis Date Noted   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   Pre-op examination 09/10/2021   Cough 09/10/2021   Primary osteoarthritis of left knee 08/07/2021   Primary osteoarthritis of right knee 08/07/2021   Sensory loss 02/13/2016   Preventative health care 02/13/2016   Advance directive discussed with patient 02/13/2016   Generalized osteoarthritis of multiple sites     Oretha Caprice, PT, MPT 12/12/2021, 10:10 AM  [facility] Physical Therapy [address] [place], [place], [address] Phone: [phone]   Fax:  [phone]  Name: Bryan Thomas MRN: [id number] Date of Birth: [birth year]

## 2021-12-18 ENCOUNTER — Encounter: Payer: Self-pay | Admitting: Physical Therapy

## 2021-12-18 ENCOUNTER — Other Ambulatory Visit: Payer: Self-pay

## 2021-12-18 ENCOUNTER — Ambulatory Visit (INDEPENDENT_AMBULATORY_CARE_PROVIDER_SITE_OTHER): Payer: PPO | Admitting: Physical Therapy

## 2021-12-18 ENCOUNTER — Telehealth: Payer: Self-pay | Admitting: *Deleted

## 2021-12-18 DIAGNOSIS — M6281 Muscle weakness (generalized): Secondary | ICD-10-CM | POA: Diagnosis not present

## 2021-12-18 DIAGNOSIS — M25561 Pain in right knee: Secondary | ICD-10-CM

## 2021-12-18 DIAGNOSIS — R262 Difficulty in walking, not elsewhere classified: Secondary | ICD-10-CM | POA: Diagnosis not present

## 2021-12-18 DIAGNOSIS — R600 Localized edema: Secondary | ICD-10-CM

## 2021-12-18 DIAGNOSIS — M25661 Stiffness of right knee, not elsewhere classified: Secondary | ICD-10-CM | POA: Diagnosis not present

## 2021-12-18 NOTE — Telephone Encounter (Signed)
Ortho bundle 90 day call attempted; no answer and left VM requesting call back. 

## 2021-12-18 NOTE — Therapy (Signed)
[place], [place], [address] Phone: [phone]   Fax:  [phone]  Physical Therapy Treatment  Patient Details  Name: Bryan Thomas MRN: [id number] Date of Birth: [birth year] Referring Provider (PT): Frankey Shown MD   Encounter Date: 12/18/2021   PT End of Session - 12/18/21 1100     Visit Number 19    Number of Visits 23    Date for PT Re-Evaluation 72/62/03   per recert sent 02/23/96   Authorization Type PN sent on 11/07/2021 at 10th visit, PN sent on 12/10/2021    Authorization Time Period Recert on 01/20/6383 for 4 more weeks beginning 12/03/2021    Progress Note Due on Visit 27    PT Start Time 1059    PT Stop Time 1139    PT Time Calculation (min) 40 min    Activity Tolerance Patient tolerated treatment well    Behavior During Therapy [facility] for tasks assessed/performed             Past Medical History:  Diagnosis Date   Generalized osteoarthritis of multiple sites     Past Surgical History:  Procedure Laterality Date   KNEE ARTHROSCOPY Right 06/21/1989   LUMBAR [place] SURGERY  06/21/1969   TONSILLECTOMY     removed as a teenager   TOTAL KNEE ARTHROPLASTY Right 09/17/2021   Procedure: RIGHT TOTAL KNEE ARTHROPLASTY;  Surgeon: Leandrew Koyanagi, MD;  Location: [place];  Service: Orthopedics;  Laterality: Right;    There were no vitals filed for this visit.   Subjective Assessment - 12/18/21 1100     Subjective Reports his Lt knee hurts worse than his Rt knee.    Currently in Pain? Yes    Pain Score 3    5/10 in Lt knee   Pain Location Knee    Pain Orientation Right    Pain Descriptors / Indicators Aching;Sore    Pain Type Surgical pain    Pain Onset More than a month ago    Pain Frequency Intermittent    Aggravating Factors  standing still    Pain Relieving Factors movement and changing positions                               [facility] Adult PT Treatment/Exercise - 12/18/21 1102        Therapeutic Activites    Other Therapeutic Activities donning socks with sock aid  - min A with  cues initially then pt able to performe with cues only      Knee/Hip Exercises: Aerobic   Nustep L6 x 10 min      Knee/Hip Exercises: Machines for Strengthening   Cybex Knee Extension 10# RLE only 3x10    Cybex Leg Press 100# RLE only 3x10      Knee/Hip Exercises: Standing   Heel Raises Both;20 reps    Forward Step Up Both;2 sets;10 reps;Hand Hold: 1;Step Height: 6"                       PT Short Term Goals - 12/10/21 1315       PT SHORT TERM GOAL #1   Title Pt will be independent in his initial HEP    Status Achieved      PT SHORT TERM GOAL #2   Title Pt will improve 5 time sit to stand to </= 20 seconds with UE support    Status  Achieved               PT Long Term Goals - 12/12/21 0959       PT LONG TERM GOAL #1   Title Pt will be indepdent in his advanced HEP    Status On-going      PT LONG TERM GOAL #2   Title Pt will be able to amb with no device on level surfaces for 1000 feet using step through gait pattern safely.    Status Achieved      PT LONG TERM GOAL #3   Title Pt will be able to climb 1 flight of stairs with single hand rail with step over step pattern with pain </= 3/10.    Status On-going      PT LONG TERM GOAL #4   Title pt will improve his right knee AROM to >/= 120 degrees in order to improve funcitonal mobility with pain </= 3/10.    Status On-going      PT LONG TERM GOAL #5   Title Pt will improve his FOTO to >/= 53 %    Baseline 31% on 10/03/2021, 52% on 11/07/2021 at 10th visit, 12/10/2021 57% at 17th visit    Status Achieved                   Plan - 12/18/21 1153     Clinical Impression Statement Pt tolerated session well today and able to demonstrate use of a sock aid with min cues only.  Progressing well with PT and anticipate transitioning to gym/home program only next 1-3 visits.  Discussed gym options today  with pt.    Personal Factors and Comorbidities Comorbidity 3+    Comorbidities right knee arthroscopy 1990, lumbar disc surgery 1970    Examination-Activity Limitations Lift;Transfers;Squat;Stairs;Stand;Dressing;Sit    Examination-Participation Restrictions Community Activity;Other;Yard Work    Stability/Clinical Decision Making Stable/Uncomplicated    Rehab Potential Good    PT Frequency 2x / week    PT Treatment/Interventions ADLs/Self Care Home Management;Vasopneumatic Device;Taping;Cryotherapy;Counsellor;Therapeutic exercise;Therapeutic activities;Functional mobility training;Stair training;Gait training;Neuromuscular re-education;Patient/family education;Passive range of motion;Scar mobilization;Manual techniques    PT Next Visit Plan start checking LTGs, anticipate d/c next few visits    PT Home Exercise Plan Access Code: [email or web address]    Consulted and Agree with Plan of Care Patient             Patient will benefit from skilled therapeutic intervention in order to improve the following deficits and impairments:  Pain, Difficulty walking, Decreased activity tolerance, Decreased balance, Decreased mobility, Decreased strength, Increased edema, Impaired flexibility  Visit Diagnosis: Difficulty in walking, not elsewhere classified  Muscle weakness (generalized)  Localized edema  Stiffness of right knee, not elsewhere classified  Acute pain of right knee     Problem List Patient Active Problem List   Diagnosis Date Noted   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   Pre-op examination 09/10/2021   Cough 09/10/2021   Primary osteoarthritis of left knee 08/07/2021   Primary osteoarthritis of right knee 08/07/2021   Sensory loss 02/13/2016   Preventative health care 02/13/2016   Advance directive discussed with patient 02/13/2016   Generalized osteoarthritis of multiple sites       Laureen Abrahams, PT, DPT 12/18/21  11:54 AM    [facility] Physical Therapy [address] [place], [place], [address] Phone: [phone]   Fax:  [phone]  Name: Bryan Thomas MRN: [id number] Date of Birth: 1940-12-10

## 2021-12-21 ENCOUNTER — Encounter: Payer: Self-pay | Admitting: Physical Therapy

## 2021-12-21 ENCOUNTER — Other Ambulatory Visit: Payer: Self-pay

## 2021-12-21 ENCOUNTER — Ambulatory Visit (INDEPENDENT_AMBULATORY_CARE_PROVIDER_SITE_OTHER): Payer: PPO | Admitting: Physical Therapy

## 2021-12-21 DIAGNOSIS — R262 Difficulty in walking, not elsewhere classified: Secondary | ICD-10-CM

## 2021-12-21 DIAGNOSIS — R600 Localized edema: Secondary | ICD-10-CM

## 2021-12-21 DIAGNOSIS — M25561 Pain in right knee: Secondary | ICD-10-CM | POA: Diagnosis not present

## 2021-12-21 DIAGNOSIS — M6281 Muscle weakness (generalized): Secondary | ICD-10-CM | POA: Diagnosis not present

## 2021-12-21 DIAGNOSIS — M25661 Stiffness of right knee, not elsewhere classified: Secondary | ICD-10-CM

## 2021-12-21 NOTE — Patient Instructions (Signed)
Access Code: [email or web address] ?URL: [email or web address] ?Date: 12/21/2021 ?Prepared by: Faustino Congress ? ?Exercises ?Supine Bridge - 3 x daily - 7 x weekly - 2 sets - 10 reps - 3 seconds hold ?Sit to Stand Without Arm Support - 1 x daily - 7 x weekly - 3 sets - 10 reps ?Seated Hamstring Stretch - 1 x daily - 7 x weekly - 3 reps - 30 hold ?Seated Knee Flexion Stretch - 1 x daily - 7 x weekly - 15 reps - 10 hold ?Mini Squat with Counter Support - 1 x daily - 7 x weekly - 3 sets - 10 reps ?Deadlift With Dumbbells - 1 x daily - 7 x weekly - 3 sets - 10 reps ?Tandem Stance - 1 x daily - 7 x weekly - 1 sets - 5 reps - 10-15 sec hold ? ?

## 2021-12-21 NOTE — Therapy (Signed)
?[facility] ?[address] ?[place], [place], [address] ?Phone: [phone]   Fax:  [phone] ? ?Physical Therapy Treatment/Discharge Summary ? ?Patient Details  ?Name: Bryan Thomas ?MRN: [id number] ?Date of Birth: Feb 22, 1941 ?Referring Provider (PT): Frankey Shown MD ? ? ?Encounter Date: 12/21/2021 ? ? PT End of Session - 12/21/21 1056   ? ? Visit Number 20   ? Number of Visits 23   ? Date for PT Re-Evaluation 12/75/17   per recert sent 0/0/17  ? Authorization Type PN sent on 11/07/2021 at 10th visit, PN sent on 12/10/2021   ? Authorization Time Period Recert on 01/28/4495 for 4 more weeks beginning 12/03/2021   ? Progress Note Due on Visit 27   ? PT Start Time 1055   ? PT Stop Time 1133   ? PT Time Calculation (min) 38 min   ? Activity Tolerance Patient tolerated treatment well   ? Behavior During Therapy [facility] for tasks assessed/performed   ? ?  ?  ? ?  ? ? ?Past Medical History:  ?Diagnosis Date  ? Generalized osteoarthritis of multiple sites   ? ? ?Past Surgical History:  ?Procedure Laterality Date  ? KNEE ARTHROSCOPY Right 06/21/1989  ? [place] SURGERY  06/21/1969  ? TONSILLECTOMY    ? removed as a teenager  ? TOTAL KNEE ARTHROPLASTY Right 09/17/2021  ? Procedure: RIGHT TOTAL KNEE ARTHROPLASTY;  Surgeon: Leandrew Koyanagi, MD;  Location: [facility];  Service: Orthopedics;  Laterality: Right;  ? ? ?There were no vitals filed for this visit. ? ? Subjective Assessment - 12/21/21 1057   ? ? Subjective doing well, feels ready to d/c today   ? Currently in Pain? No/denies   ? Pain Onset More than a month ago   ? ?  ?  ? ?  ? ? ? ? ? [facility] PT Assessment - 12/21/21 1120   ? ?  ? Assessment  ? Medical Diagnosis right TKA M17.11   ? Referring Provider (PT) Frankey Shown MD   ?  ? AROM  ? Right Knee Extension 0   seated LAQ  ? Right Knee Flexion 116   ? ?  ?  ? ?  ? ? ? ? ? ? ? ? ? ? ? ? ? ? ? ? [facility] Adult PT Treatment/Exercise - 12/21/21 1058   ? ?  ? Knee/Hip Exercises: Aerobic  ? Recumbent Bike  Seat 9, L3 x 11:30 min   ?  ? Knee/Hip Exercises: Standing  ? Other Standing Knee Exercises mini squats at counter 3x10 with UE support   ? Other Standing Knee Exercises deadlifts with 12# (6# DB) 3x10 - min cues for technique   ? ?  ?  ? ?  ? ? ? ? ? ? Balance Exercises - 12/21/21 1113   ? ?  ? Balance Exercises: Standing  ? Tandem Stance Intermittent upper extremity support;5 reps;15 secs   bil  ? ?  ?  ? ?  ? ? ? ? ? PT Education - 12/21/21 1133   ? ? Education Details HEP   ? Person(s) Educated Patient   ? Methods Explanation;Demonstration;Handout   ? Comprehension Verbalized understanding;Returned demonstration   ? ?  ?  ? ?  ? ? ? PT Short Term Goals - 12/10/21 1315   ? ?  ? PT SHORT TERM GOAL #1  ? Title Pt will be independent in his initial HEP   ? Status Achieved   ?  ?  PT SHORT TERM GOAL #2  ? Title Pt will improve 5 time sit to stand to </= 20 seconds with UE support   ? Status Achieved   ? ?  ?  ? ?  ? ? ? ? PT Long Term Goals - 12/21/21 1133   ? ?  ? PT LONG TERM GOAL #1  ? Title Pt will be indepdent in his advanced HEP   ? Status Achieved   ?  ? PT LONG TERM GOAL #2  ? Title Pt will be able to amb with no device on level surfaces for 1000 feet using step through gait pattern safely.   ? Status Achieved   ?  ? PT LONG TERM GOAL #3  ? Title Pt will be able to climb 1 flight of stairs with single hand rail with step over step pattern with pain </= 3/10.   ? Status Achieved   ?  ? PT LONG TERM GOAL #4  ? Title pt will improve his right knee AROM to >/= 120 degrees in order to improve funcitonal mobility with pain </= 3/10.   ? Status Partially Met   ?  ? PT LONG TERM GOAL #5  ? Title Pt will improve his FOTO to >/= 53 %   ? Baseline 31% on 10/03/2021, 52% on 11/07/2021 at 10th visit, 12/10/2021 57% at 17th visit   ? Status Achieved   ? ?  ?  ? ?  ? ? ? ? ? ? ? ? Plan - 12/21/21 1133   ? ? Clinical Impression Statement Pt has met/partially met all LTGs and is ready for d/c today.  Will d/c PT.   ? Personal  Factors and Comorbidities Comorbidity 3+   ? Comorbidities right knee arthroscopy 1990, lumbar disc surgery 1970   ? Examination-Activity Limitations Lift;Transfers;Squat;Stairs;Stand;Dressing;Sit   ? Examination-Participation Restrictions Community Activity;Other;[employer] Work   ? Stability/Clinical Decision Making Stable/Uncomplicated   ? Rehab Potential Good   ? PT Frequency 2x / week   ? PT Treatment/Interventions ADLs/Self Care Home Management;Vasopneumatic Device;Taping;Cryotherapy;Counsellor;Therapeutic exercise;Therapeutic activities;Functional mobility training;Stair training;Gait training;Neuromuscular re-education;Patient/family education;Passive range of motion;Scar mobilization;Manual techniques   ? PT Next Visit Plan d/c PT today   ? PT Home Exercise Plan Access Code: [email or web address]   ? Consulted and Agree with Plan of Care Patient   ? ?  ?  ? ?  ? ? ?Patient will benefit from skilled therapeutic intervention in order to improve the following deficits and impairments:  Pain, Difficulty walking, Decreased activity tolerance, Decreased balance, Decreased mobility, Decreased strength, Increased edema, Impaired flexibility ? ?Visit Diagnosis: ?Difficulty in walking, not elsewhere classified ? ?Muscle weakness (generalized) ? ?Localized edema ? ?Stiffness of right knee, not elsewhere classified ? ?Acute pain of right knee ? ? ? ? ?Problem List ?Patient Active Problem List  ? Diagnosis Date Noted  ? 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  ? Pre-op examination 09/10/2021  ? Cough 09/10/2021  ? Primary osteoarthritis of left knee 08/07/2021  ? Primary osteoarthritis of right knee 08/07/2021  ? Sensory loss 02/13/2016  ? Preventative health care 02/13/2016  ? Advance directive discussed with patient 02/13/2016  ? Generalized osteoarthritis of multiple sites   ? ? ? ?Laureen Abrahams, PT, DPT ?12/21/21 11:35 AM ? ? ?[place] ?[facility] ?[address] ?[place], [place], [address] ?Phone: [phone]   Fax:  [phone] ? ?Name: Bryan Thomas ?MRN: [id number] ?Date of Birth: 1941-02-08 ? ? ? ? ? ?PHYSICAL  THERAPY DISCHARGE SUMMARY ? ?Visits from Start of Care: 20 ? ?Current functional level related to goals / functional outcomes: ?See above ?  ?Remaining deficits: ?See above ?  ?Education / Equipment: ?HEP  ? ?Patient agrees to discharge. Patient goals were partially met. Patient is being discharged due to being pleased with the current functional level. ? ?Laureen Abrahams, PT, DPT ?12/21/21 11:35 AM ? ?[place] ?[facility] ?[address] ?[place], [place], [address] ?Phone: [phone]   Fax:  [phone] ? ? ?

## 2021-12-25 ENCOUNTER — Encounter: Payer: PPO | Admitting: Physical Therapy

## 2021-12-28 ENCOUNTER — Encounter: Payer: PPO | Admitting: Physical Therapy

## 2022-01-18 ENCOUNTER — Encounter: Payer: Self-pay | Admitting: Physician Assistant

## 2022-01-18 ENCOUNTER — Ambulatory Visit (INDEPENDENT_AMBULATORY_CARE_PROVIDER_SITE_OTHER): Payer: PPO | Admitting: Physician Assistant

## 2022-01-18 VITALS — BP 111/71 | HR 74 | Temp 98.3°F | Ht 74.0 in | Wt 256.0 lb

## 2022-01-18 DIAGNOSIS — Z7689 Persons encountering health services in other specified circumstances: Secondary | ICD-10-CM

## 2022-01-18 DIAGNOSIS — J988 Other specified respiratory disorders: Secondary | ICD-10-CM

## 2022-01-18 DIAGNOSIS — B9689 Other specified bacterial agents as the cause of diseases classified elsewhere: Secondary | ICD-10-CM

## 2022-01-18 MED ORDER — BENZONATATE 200 MG PO CAPS: 200.0000 mg | ORAL_CAPSULE | Freq: Three times a day (TID) | ORAL | 0 refills | Status: DC | PRN

## 2022-01-18 MED ORDER — METHYLPREDNISOLONE 4 MG PO TBPK: ORAL_TABLET | ORAL | 0 refills | Status: DC

## 2022-01-18 MED ORDER — AZITHROMYCIN 250 MG PO TABS
ORAL_TABLET | ORAL | 0 refills | Status: AC
Start: 2022-01-18 — End: 2022-01-23

## 2022-01-18 NOTE — Progress Notes (Signed)
? ?New Patient Office Visit ? ?Subjective:  ?Patient ID: Bryan Thomas, male    DOB: 04-04-1941  Age: 81 y.o. MRN: [id number] ? ?CC:  ?Chief Complaint  ?Patient presents with  ? New Patient (Initial Visit)  ? ? ?HPI ?Bryan Thomas presents to establish care. Patient reports has been sick for about a week with sore throat, productive cough, hoarseness, wheezing, metallic taste (started after knee surgery). Has tried cough drops, nothing else. No headache, sinus pressure, fever, body ache, shortness of breath, or chills. Reports bowels go between constipation and diarrhea x couple of months, no abdominal pain, dysuria, belching, nausea, or vomiting. ? ? ?Past Medical History:  ?Diagnosis Date  ? Generalized osteoarthritis of multiple sites   ? ? ?Past Surgical History:  ?Procedure Laterality Date  ? KNEE ARTHROSCOPY Right 06/21/1989  ? [place] SURGERY  06/21/1969  ? TONSILLECTOMY    ? removed as a teenager  ? TOTAL KNEE ARTHROPLASTY Right 09/17/2021  ? Procedure: RIGHT TOTAL KNEE ARTHROPLASTY;  Surgeon: Leandrew Koyanagi, MD;  Location: [facility];  Service: Orthopedics;  Laterality: Right;  ? ? ?Family History  ?Problem Relation Age of Onset  ? Cancer Mother   ? Diabetes Sister   ? Diabetes Brother   ? Heart disease Brother   ?     CABG  ? Diabetes Brother   ? Diabetes Brother   ? ? ?Social History  ? ?Socioeconomic History  ? Marital status: Widowed  ?  Spouse name: Not on file  ? Number of children: 4  ? Years of education: Not on file  ? Highest education level: Not on file  ?Occupational History  ? Occupation: Service work--mechanic  ?  Comment: tired  ?Tobacco Use  ? Smoking status: Former  ? Smokeless tobacco: Former  ?  Types: Chew  ?  Quit date: 08/21/2021  ?Vaping Use  ? Vaping Use: Never used  ?Substance and Sexual Activity  ? Alcohol use: No  ?  Alcohol/week: 0.0 standard drinks  ? Drug use: Never  ? Sexual activity: Not on file  ?Other Topics Concern  ? Not on file  ?Social History Narrative  ? Widowed  1/22  ?   ? Has living will  ? Daughter [name] is health care POA  ? Would accept resuscitation attempts--but no prolonged artificial ventilation  ? No tube feeds if cognitively unaware  ? ?Social Determinants of Health  ? ?Financial Resource Strain: Not on file  ?Food Insecurity: Not on file  ?Transportation Needs: Not on file  ?Physical Activity: Not on file  ?Stress: Not on file  ?Social Connections: Not on file  ?Intimate Partner Violence: Not on file  ? ? ?ROS ?Review of Systems ?Review of Systems:  ?A fourteen system review of systems was performed and found to be positive as per HPI. ? ? ?Objective:  ? ?Today's Vitals: BP 111/71   Pulse 74   Temp 98.3 ?F (36.8 ?C)   Ht '6\' 2"'$  (1.88 m)   Wt 256 lb (116.1 kg)   SpO2 98%   BMI 32.87 kg/m?  ? ?Physical Exam ?General:  Well Developed, well nourished, appropriate for stated age.  ?Neuro:  Alert and oriented,  extra-ocular muscles intact  ?HEENT:  Normocephalic, atraumatic, conjunctiva clear, no sinus pressure, normal TM's of both ears, pink and moist nasal mucosa with slight boggy turbinates (left nostril), mild erythema or posterior oropharynx, neck supple, no adenopathy  ?Skin:  no gross rash, warm, pink. ?Cardiac:  RRR,  S1 S2 ?Respiratory: Expiratory wheezing with slight crackles at lung bases, no respiratory distress. ?Vascular:  Ext warm, no cyanosis apprec.; cap RF less 2 sec. ?Psych:  No HI/SI, judgement and insight good, Euthymic mood. Full Affect. ? ?Assessment & Plan:  ? ?Problem List Items Addressed This Visit   ?None ?Visit Diagnoses   ? ? Encounter to establish care    -  Primary  ? Bacterial respiratory infection      ? Relevant Medications  ? azithromycin (ZITHROMAX) 250 MG tablet  ? methylPREDNISolone (MEDROL DOSEPAK) 4 MG TBPK tablet  ? benzonatate (TESSALON) 200 MG capsule  ? ?  ? ?Encounter to establish care: ?-Patient presenting with acute complaints concerning for bacterial respiratory infection (pneumonia) so will start antibiotic therapy  with Azithromycin and will also start corticosteroid therapy. Advised can take tessalon Perles as needed. Will reassess symptoms early next week.  ?-Reviewed records in [facility].   ? ? ?Outpatient Encounter Medications as of 01/18/2022  ?Medication Sig  ? azithromycin (ZITHROMAX) 250 MG tablet Take 2 tablets on day 1, then 1 tablet daily on days 2 through 5  ? benzonatate (TESSALON) 200 MG capsule Take 1 capsule (200 mg total) by mouth 3 (three) times daily as needed for cough.  ? methylPREDNISolone (MEDROL DOSEPAK) 4 MG TBPK tablet Take as directed on package.  ? [DISCONTINUED] aspirin EC 81 MG tablet Take 1 tablet (81 mg total) by mouth 2 (two) times daily. To be taken after surgery (Patient taking differently: Take 81 mg by mouth 2 (two) times daily.)  ? [DISCONTINUED] benzonatate (TESSALON) 100 MG capsule Take 1 capsule (100 mg total) by mouth every 8 (eight) hours.  ? [DISCONTINUED] docusate sodium (COLACE) 100 MG capsule Take 1 capsule (100 mg total) by mouth daily as needed. (Patient taking differently: Take 100 mg by mouth daily as needed for mild constipation.)  ? [DISCONTINUED] methocarbamol (ROBAXIN) 500 MG tablet Take 1 tablet (500 mg total) by mouth 2 (two) times daily as needed. To be taken after surgery (Patient taking differently: Take 500 mg by mouth 2 (two) times daily as needed for muscle spasms.)  ? [DISCONTINUED] ondansetron (ZOFRAN) 4 MG tablet Take 1 tablet (4 mg total) by mouth every 8 (eight) hours as needed for nausea or vomiting.  ? [DISCONTINUED] polyethylene glycol powder (GLYCOLAX/MIRALAX) 17 GM/SCOOP powder Take 255 g by mouth in the morning, at noon, and at bedtime. One scoop in beverage of your choice with each meal. You may increase if you continue to be constipated and decrease if your stool becomes too loose.  ? [DISCONTINUED] traMADol (ULTRAM) 50 MG tablet Take 1 tablet (50 mg total) by mouth every 6 (six) hours as needed.  ? ?No facility-administered encounter medications on file as  of 01/18/2022.  ? ? ?Follow-up: Return for next week (mont/tues) for f/up on respiratory infection/pneu.  ? ?Lorrene Reid, PA-C ? ?

## 2022-01-22 ENCOUNTER — Ambulatory Visit (INDEPENDENT_AMBULATORY_CARE_PROVIDER_SITE_OTHER): Payer: PPO | Admitting: Physician Assistant

## 2022-01-22 ENCOUNTER — Encounter: Payer: Self-pay | Admitting: Physician Assistant

## 2022-01-22 VITALS — BP 126/72 | HR 62 | Temp 97.7°F | Ht 74.0 in | Wt 256.0 lb

## 2022-01-22 DIAGNOSIS — J988 Other specified respiratory disorders: Secondary | ICD-10-CM

## 2022-01-22 DIAGNOSIS — B9689 Other specified bacterial agents as the cause of diseases classified elsewhere: Secondary | ICD-10-CM

## 2022-01-22 NOTE — Progress Notes (Signed)
? ?Established Patient Office Visit ? ?Subjective:  ?Patient ID: Bryan Thomas, male    DOB: 04/24/1941  Age: 81 y.o. MRN: [id number] ? ?CC:  ?Chief Complaint  ?Patient presents with  ? Follow-up  ? URI  ? ? ?HPI ?Bennett Scrape presents for follow-up on respiratory infection. Reports feeling better. Cough is better too, not coughing as much. No new symptoms. No fever. Reports almost done with antibiotic and steroid taper. Pt also complains of twitch sensation over left side which is intermittent and lasts briefly, <1 min. No chest pain, palpitations, syncope, jaw pain or shortness of breath. ? ?Past Medical History:  ?Diagnosis Date  ? Generalized osteoarthritis of multiple sites   ? ? ?Past Surgical History:  ?Procedure Laterality Date  ? KNEE ARTHROSCOPY Right 06/21/1989  ? [place] SURGERY  06/21/1969  ? TONSILLECTOMY    ? removed as a teenager  ? TOTAL KNEE ARTHROPLASTY Right 09/17/2021  ? Procedure: RIGHT TOTAL KNEE ARTHROPLASTY;  Surgeon: Leandrew Koyanagi, MD;  Location: [facility];  Service: Orthopedics;  Laterality: Right;  ? ? ?Family History  ?Problem Relation Age of Onset  ? Cancer Mother   ? Diabetes Sister   ? Diabetes Brother   ? Heart disease Brother   ?     CABG  ? Diabetes Brother   ? Diabetes Brother   ? ? ?Social History  ? ?Socioeconomic History  ? Marital status: Widowed  ?  Spouse name: Not on file  ? Number of children: 4  ? Years of education: Not on file  ? Highest education level: Not on file  ?Occupational History  ? Occupation: Service work--mechanic  ?  Comment: tired  ?Tobacco Use  ? Smoking status: Former  ? Smokeless tobacco: Former  ?  Types: Chew  ?  Quit date: 08/21/2021  ?Vaping Use  ? Vaping Use: Never used  ?Substance and Sexual Activity  ? Alcohol use: No  ?  Alcohol/week: 0.0 standard drinks  ? Drug use: Never  ? Sexual activity: Not on file  ?Other Topics Concern  ? Not on file  ?Social History Narrative  ? Widowed 1/22  ?   ? Has living will  ? Daughter [name] is health care  POA  ? Would accept resuscitation attempts--but no prolonged artificial ventilation  ? No tube feeds if cognitively unaware  ? ?Social Determinants of Health  ? ?Financial Resource Strain: Not on file  ?Food Insecurity: Not on file  ?Transportation Needs: Not on file  ?Physical Activity: Not on file  ?Stress: Not on file  ?Social Connections: Not on file  ?Intimate Partner Violence: Not on file  ? ? ?Outpatient Medications Prior to Visit  ?Medication Sig Dispense Refill  ? azithromycin (ZITHROMAX) 250 MG tablet Take 2 tablets on day 1, then 1 tablet daily on days 2 through 5 6 tablet 0  ? benzonatate (TESSALON) 200 MG capsule Take 1 capsule (200 mg total) by mouth 3 (three) times daily as needed for cough. 30 capsule 0  ? methylPREDNISolone (MEDROL DOSEPAK) 4 MG TBPK tablet Take as directed on package. 21 tablet 0  ? ?No facility-administered medications prior to visit.  ? ? ?Allergies  ?Allergen Reactions  ? Oxycodone   ?  "Ants crawling all over me"  ? ? ?ROS ?Review of Systems ?Review of Systems:  ?A fourteen system review of systems was performed and found to be positive as per HPI. ? ?  ?Objective:  ?  ?Physical Exam ?General:  Cooperative, in no acute distress, appropriate for stated age.  ?Neuro:  Alert and oriented,  extra-ocular muscles intact  ?HEENT:  Normocephalic, atraumatic, neck supple  ?Skin:  no gross rash, warm, pink. ?Cardiac:  RRR, S1 S2 ?Respiratory: CTA B/L w/o wheezing, rhonchi, crackles or rales. ?Vascular:  Ext warm, no cyanosis apprec.; cap RF less 2 sec. ?Psych:  No HI/SI, judgement and insight good, Euthymic mood. Full Affect. ? ?BP 126/72   Pulse 62   Temp 97.7 ?F (36.5 ?C)   Ht '6\' 2"'$  (1.88 m)   Wt 256 lb (116.1 kg)   SpO2 98%   BMI 32.87 kg/m?  ?Wt Readings from Last 3 Encounters:  ?01/22/22 256 lb (116.1 kg)  ?01/18/22 256 lb (116.1 kg)  ?10/24/21 250 lb (113.4 kg)  ? ? ? ?There are no preventive care reminders to display for this patient. ? ?There are no preventive care  reminders to display for this patient. ? ?No results found for: TSH ?Lab Results  ?Component Value Date  ? WBC 12.0 (H) 10/24/2021  ? HGB 15.6 10/24/2021  ? HCT 46.0 10/24/2021  ? MCV 97.4 10/24/2021  ? PLT 331 10/24/2021  ? ?Lab Results  ?Component Value Date  ? NA 137 10/24/2021  ? K 4.5 10/24/2021  ? CO2 27 10/24/2021  ? GLUCOSE 111 (H) 10/24/2021  ? BUN 21 10/24/2021  ? CREATININE 0.70 10/24/2021  ? BILITOT 1.1 10/24/2021  ? ALKPHOS 128 (H) 10/24/2021  ? AST 23 10/24/2021  ? ALT 25 10/24/2021  ? PROT 7.7 10/24/2021  ? ALBUMIN 3.6 10/24/2021  ? CALCIUM 9.2 10/24/2021  ? ANIONGAP 7 10/24/2021  ? GFR 80.13 03/23/2021  ? ?No results found for: CHOL ?No results found for: HDL ?No results found for: [place] ?No results found for: TRIG ?No results found for: CHOLHDL ?No results found for: HGBA1C ? ?  ?Assessment & Plan:  ? ?Problem List Items Addressed This Visit   ?None ?Visit Diagnoses   ? ? Bacterial respiratory infection    -  Primary  ? ?  ? ? ?Significant clinical improvement today. Recommend to complete Azithromycin and Medrol dosepak. Continue tessalon Perles and cough drops as needed. Advised can take OTC Delsym. Discussed to monitor for worsening symptoms and should notify the clinic. Advised twitch sensation is possibly nerve related, recommend to monitor for now.  ? ?No orders of the defined types were placed in this encounter. ? ? ?Follow-up: Return in about 3 months (around 04/23/2022) for [place] and [place].  ? ? ?Lorrene Reid, PA-C ?

## 2022-03-15 ENCOUNTER — Telehealth: Payer: Self-pay | Admitting: *Deleted

## 2022-03-15 NOTE — Telephone Encounter (Signed)
Ortho bundle 90 day call and survey completed. 

## 2022-03-19 ENCOUNTER — Encounter: Payer: Self-pay | Admitting: Physician Assistant

## 2022-03-19 ENCOUNTER — Ambulatory Visit (INDEPENDENT_AMBULATORY_CARE_PROVIDER_SITE_OTHER): Payer: PPO | Admitting: Physician Assistant

## 2022-03-19 VITALS — BP 136/83 | HR 94 | Temp 97.7°F | Ht 74.0 in | Wt 256.0 lb

## 2022-03-19 DIAGNOSIS — R053 Chronic cough: Secondary | ICD-10-CM | POA: Diagnosis not present

## 2022-03-19 DIAGNOSIS — R7989 Other specified abnormal findings of blood chemistry: Secondary | ICD-10-CM | POA: Diagnosis not present

## 2022-03-19 DIAGNOSIS — R42 Dizziness and giddiness: Secondary | ICD-10-CM | POA: Diagnosis not present

## 2022-03-19 NOTE — Patient Instructions (Signed)

## 2022-03-19 NOTE — Progress Notes (Signed)
Established patient acute visit   Patient: CHANDLOR NOECKER   DOB: 1941-08-20   81 y.o. Male  MRN: [id number] Visit Date: 03/19/2022  Chief Complaint  Patient presents with   Dizziness        Subjective    HPI HPI     Dizziness    Additional comments:        Last edited by Mickel Crow, CMA on 03/19/2022  2:58 PM.      Patient presents with c/o dizziness that started Sunday (2 days ago). Also reports having a dull headache that started that same day. States today has noticed double vision. No changes in speech, confusion, numbness or tingling sensation, chest pain, shortness of breath, palpitations, syncope, runny nose or nasal congestion. Patient's daughter reports his balance was off on Sunday and required some assistance with ambulation. Patient and daughter states ambulation has not been an issue since then. Patient drinks about 1 glass of water daily. Patient's daughter also reports he has been experiencing constipation for 2-3 months. Patient denies prior hx of migraine.       Medications: Outpatient Medications Prior to Visit  Medication Sig   [DISCONTINUED] benzonatate (TESSALON) 200 MG capsule Take 1 capsule (200 mg total) by mouth 3 (three) times daily as needed for cough. (Patient not taking: Reported on 03/19/2022)   [DISCONTINUED] methylPREDNISolone (MEDROL DOSEPAK) 4 MG TBPK tablet Take as directed on package. (Patient not taking: Reported on 03/19/2022)   No facility-administered medications prior to visit.    Review of Systems Review of Systems:  A fourteen system review of systems was performed and found to be positive as per HPI.   Last CBC Lab Results  Component Value Date   WBC 12.0 (H) 10/24/2021   HGB 15.6 10/24/2021   HCT 46.0 10/24/2021   MCV 97.4 10/24/2021   MCH 31.1 10/24/2021   RDW 13.2 10/24/2021   PLT 331 60/07/9322   Last metabolic panel Lab Results  Component Value Date   GLUCOSE 111 (H) 10/24/2021   NA 137 10/24/2021    K 4.5 10/24/2021   CL 101 10/24/2021   CO2 27 10/24/2021   BUN 21 10/24/2021   CREATININE 0.70 10/24/2021   GFRNONAA >60 10/24/2021   CALCIUM 9.2 10/24/2021   PROT 7.7 10/24/2021   ALBUMIN 3.6 10/24/2021   BILITOT 1.1 10/24/2021   ALKPHOS 128 (H) 10/24/2021   AST 23 10/24/2021   ALT 25 10/24/2021   ANIONGAP 7 10/24/2021   Last lipids No results found for: CHOL, HDL, LDLCALC, LDLDIRECT, TRIG, CHOLHDL Last hemoglobin A1c No results found for: HGBA1C Last thyroid functions No results found for: TSH, T3TOTAL, T4TOTAL, THYROIDAB Last vitamin D No results found for: 25OHVITD2, 25OHVITD3, VD25OH Last vitamin B12 and Folate Lab Results  Component Value Date   VITAMINB12 239 03/23/2021     Objective    BP 136/83   Pulse 94   Temp 97.7 F (36.5 C)   Ht '6\' 2"'$  (1.88 m)   Wt 256 lb (116.1 kg)   SpO2 99%   BMI 32.87 kg/m    Physical Exam  General:  In no acute distress, non-diaphoretic, appropriate for stated age.  Neuro:  Alert and oriented x 3,  extra-ocular muscles intact, no focal deficits  HEENT:  Normocephalic, atraumatic, neck supple  Skin:  no gross rash, warm, pink. Cardiac:  RRR, S1 S2 Respiratory: rhonchi at lung bases, no wheezing or crackles. Vascular:  Ext warm, no cyanosis apprec.; cap RF less 2 sec.  No edema. Psych:  No HI/SI, judgement and insight good, Euthymic mood. Full Affect.   No results found for any visits on 03/19/22.  Assessment & Plan     Etiology of symptoms unclear. Orthostatics obtained and negative. No nystagmus noted on exam so less likely vertigo. EKG obtained: sinus rhythm with 1st degree AV block with PACs in a pattern of bigeminy, incomplete RBBB and minimal voltage criteria for LVH, rate 70 bpm, no acute ST-T wave changes. Compared to EKG from 10/25/2021, PACs are a new finding. Patient declined ED evaluation at this time. Discussed with patient and daughter red flag s/sx to monitor for which pt should seek immediate medical care/call  911. Both verbalized understanding. Will obtain labs for further evaluation. Pending lab results, additional work-up I recommend are echocardiogram, Holter Monitor and/or head CT w/o contrast. Migraine also on DDX. Discussed adequate water intake. Will also place chest x-ray for further evaluation of chronic cough.    Return if symptoms worsen or fail to improve.        Lorrene Reid, PA-C  [facility] at [facility] [phone] (phone) [phone] (fax)  [place]

## 2022-03-20 LAB — COMPREHENSIVE METABOLIC PANEL
ALT: 21 IU/L (ref 0–44)
AST: 21 IU/L (ref 0–40)
Albumin/Globulin Ratio: 1.6 (ref 1.2–2.2)
Albumin: 4.5 g/dL (ref 3.6–4.6)
Alkaline Phosphatase: 140 IU/L — ABNORMAL HIGH (ref 44–121)
BUN/Creatinine Ratio: 20 (ref 10–24)
BUN: 16 mg/dL (ref 8–27)
Bilirubin Total: 0.6 mg/dL (ref 0.0–1.2)
CO2: 22 mmol/L (ref 20–29)
Calcium: 9.7 mg/dL (ref 8.6–10.2)
Chloride: 103 mmol/L (ref 96–106)
Creatinine, Ser: 0.82 mg/dL (ref 0.76–1.27)
Globulin, Total: 2.9 g/dL (ref 1.5–4.5)
Glucose: 120 mg/dL — ABNORMAL HIGH (ref 70–99)
Potassium: 4.9 mmol/L (ref 3.5–5.2)
Sodium: 138 mmol/L (ref 134–144)
Total Protein: 7.4 g/dL (ref 6.0–8.5)
eGFR: 88 mL/min/{1.73_m2} (ref >59)

## 2022-03-20 LAB — CBC WITH DIFFERENTIAL/PLATELET
Basophils Absolute: 0 10*3/uL (ref 0.0–0.2)
Basos: 0 %
EOS (ABSOLUTE): 0.4 10*3/uL (ref 0.0–0.4)
Eos: 4 %
Hematocrit: 46 % (ref 37.5–51.0)
Hemoglobin: 16.6 g/dL (ref 13.0–17.7)
Immature Grans (Abs): 0 10*3/uL (ref 0.0–0.1)
Immature Granulocytes: 0 %
Lymphocytes Absolute: 4.5 10*3/uL — ABNORMAL HIGH (ref 0.7–3.1)
Lymphs: 41 %
MCH: 33 pg (ref 26.6–33.0)
MCHC: 36.1 g/dL — ABNORMAL HIGH (ref 31.5–35.7)
MCV: 92 fL (ref 79–97)
Monocytes Absolute: 0.8 10*3/uL (ref 0.1–0.9)
Monocytes: 7 %
Neutrophils Absolute: 5.2 10*3/uL (ref 1.4–7.0)
Neutrophils: 48 %
Platelets: 270 10*3/uL (ref 150–450)
RBC: 5.03 x10E6/uL (ref 4.14–5.80)
RDW: 13 % (ref 11.6–15.4)
WBC: 10.9 10*3/uL — ABNORMAL HIGH (ref 3.4–10.8)

## 2022-03-20 NOTE — Addendum Note (Signed)
Addended by: Mickel Crow on: 03/20/2022 03:39 PM   Modules accepted: Orders

## 2022-03-21 ENCOUNTER — Telehealth: Payer: Self-pay | Admitting: Oncology

## 2022-03-21 NOTE — Telephone Encounter (Signed)
Called 1x, attempted to contact patient in regards to referral no answer so left voicemail for patient to call back

## 2022-03-22 ENCOUNTER — Ambulatory Visit
Admission: RE | Admit: 2022-03-22 | Discharge: 2022-03-22 | Disposition: A | Discharge status: Home / Self Care | Payer: PPO | Source: Ambulatory Visit | Attending: Physician Assistant | Admitting: Physician Assistant

## 2022-03-22 DIAGNOSIS — R053 Chronic cough: Secondary | ICD-10-CM | POA: Diagnosis not present

## 2022-03-28 ENCOUNTER — Other Ambulatory Visit: Payer: PPO

## 2022-03-28 DIAGNOSIS — R748 Abnormal levels of other serum enzymes: Secondary | ICD-10-CM

## 2022-03-28 DIAGNOSIS — R7989 Other specified abnormal findings of blood chemistry: Secondary | ICD-10-CM | POA: Diagnosis not present

## 2022-03-30 LAB — CBC
Hematocrit: 44.3 % (ref 37.5–51.0)
Hemoglobin: 15.4 g/dL (ref 13.0–17.7)
MCH: 32 pg (ref 26.6–33.0)
MCHC: 34.8 g/dL (ref 31.5–35.7)
MCV: 92 fL (ref 79–97)
Platelets: 259 10*3/uL (ref 150–450)
RBC: 4.81 x10E6/uL (ref 4.14–5.80)
RDW: 12.6 % (ref 11.6–15.4)
WBC: 8.3 10*3/uL (ref 3.4–10.8)

## 2022-03-30 LAB — COMPREHENSIVE METABOLIC PANEL
ALT: 20 IU/L (ref 0–44)
AST: 19 IU/L (ref 0–40)
Albumin/Globulin Ratio: 1.5 (ref 1.2–2.2)
Albumin: 4.1 g/dL (ref 3.6–4.6)
Alkaline Phosphatase: 118 IU/L (ref 44–121)
BUN/Creatinine Ratio: 20 (ref 10–24)
BUN: 16 mg/dL (ref 8–27)
Bilirubin Total: 0.5 mg/dL (ref 0.0–1.2)
CO2: 21 mmol/L (ref 20–29)
Calcium: 9.3 mg/dL (ref 8.6–10.2)
Chloride: 102 mmol/L (ref 96–106)
Creatinine, Ser: 0.81 mg/dL (ref 0.76–1.27)
Globulin, Total: 2.7 g/dL (ref 1.5–4.5)
Glucose: 136 mg/dL — ABNORMAL HIGH (ref 70–99)
Potassium: 4.9 mmol/L (ref 3.5–5.2)
Sodium: 137 mmol/L (ref 134–144)
Total Protein: 6.8 g/dL (ref 6.0–8.5)
eGFR: 88 mL/min/{1.73_m2} (ref >59)

## 2022-03-30 LAB — HEPB+HEPC+HIV PANEL
HIV Screen 4th Generation wRfx: NONREACTIVE
Hep B C IgM: NEGATIVE
Hep B Core Total Ab: NEGATIVE
Hep B E Ab: NEGATIVE
Hep B E Ag: NEGATIVE
Hep B Surface Ab, Qual: NONREACTIVE
Hep C Virus Ab: NONREACTIVE
Hepatitis B Surface Ag: NEGATIVE

## 2022-03-30 LAB — GAMMA GT: GGT: 21 IU/L (ref 0–65)

## 2022-04-01 ENCOUNTER — Inpatient Hospital Stay: Payer: PPO

## 2022-04-01 ENCOUNTER — Encounter: Payer: Self-pay | Admitting: Oncology

## 2022-04-01 ENCOUNTER — Inpatient Hospital Stay: Payer: PPO | Attending: Oncology | Admitting: Oncology

## 2022-04-01 VITALS — BP 134/76 | HR 59 | Temp 97.2°F | Resp 18 | Ht 74.0 in | Wt 258.2 lb

## 2022-04-01 DIAGNOSIS — Z809 Family history of malignant neoplasm, unspecified: Secondary | ICD-10-CM | POA: Diagnosis not present

## 2022-04-01 DIAGNOSIS — Z803 Family history of malignant neoplasm of breast: Secondary | ICD-10-CM | POA: Insufficient documentation

## 2022-04-01 DIAGNOSIS — R053 Chronic cough: Secondary | ICD-10-CM | POA: Diagnosis not present

## 2022-04-01 DIAGNOSIS — Z87891 Personal history of nicotine dependence: Secondary | ICD-10-CM | POA: Insufficient documentation

## 2022-04-01 DIAGNOSIS — D72829 Elevated white blood cell count, unspecified: Secondary | ICD-10-CM | POA: Insufficient documentation

## 2022-04-01 DIAGNOSIS — R590 Localized enlarged lymph nodes: Secondary | ICD-10-CM | POA: Insufficient documentation

## 2022-04-01 DIAGNOSIS — C9110 Chronic lymphocytic leukemia of B-cell type not having achieved remission: Secondary | ICD-10-CM | POA: Insufficient documentation

## 2022-04-01 NOTE — Assessment & Plan Note (Signed)
Nonspecific CT findings.  Patient does have a chronic cough. Recommend to repeat a CT chest with contrast 6 months after his CT in January 2023.

## 2022-04-01 NOTE — Progress Notes (Signed)
Pt here to establish care.

## 2022-04-01 NOTE — Progress Notes (Signed)
[facility] CONSULT NOTE  REFERRING PROVIDER: Lorrene Reid, PA-C   Patient Care Team: Lorrene Reid, PA-C as PCP - General (Physician Assistant)  ASSESSMENT & PLAN:  Leukocytosis Leukocytosis intermittently, likely reactive in nature. Recent CBC showed normalization of white count.  I recommend observation and repeat CBC in 4 to 6 weeks.  Mediastinal lymphadenopathy Nonspecific CT findings.  Patient does have a chronic cough. Recommend to repeat a CT chest with contrast 6 months after his CT in January 2023.   Orders Placed This Encounter  Procedures   CT Chest W Contrast    Standing Status:   Future    Standing Expiration Date:   04/01/2023    Order Specific Question:   If indicated for the ordered procedure, I authorize the administration of contrast media per Radiology protocol    Answer:   Yes    Order Specific Question:   Preferred imaging location?    Answer:   [facility]   CBC with Differential/Platelet    Standing Status:   Future    Standing Expiration Date:   04/02/2023    All questions were answered. The patient knows to call the clinic with any problems, questions or concerns. No barriers to learning was detected. If above work-up results are negative, he does not need to follow-up with hematology clinic.  The total time spent in the appointment was 45 minutes encounter with patients including review of chart and various tests results, discussions about plan of care and coordination of care plan  Earlie Server, MD 04/01/2022   CHIEF COMPLAINTS/PURPOSE OF CONSULTATION:  Abnormal CBC  HISTORY OF PRESENTING ILLNESS:  Bryan Thomas 81 y.o. male presents to establish care for evaluation of abnormal CBC.  03/19/2022, CBC showed a WBC of 10.9.  Patient recalls having tooth extraction prior to the blood work.  Repeat CBC on 03/28/2022 showed WBC normalization to 8.3, no differential was checked. Reviewed patient's previous labs, patient has met  elevated white count on 09/18/2021, 10/24/2021.  Reports that he has had knee surgery in November. He denies any recent upper respiratory infections, steroid use.  Denies unintentional weight loss, night sweats, fever.  Appetite is fair.   He was accompanied by his daughter-in-law. + Chronic cough for the past couple of months .  Former smoker, 40-pack-year smoking history, quit in 1990s. 10/24/2021, CT angio chest abdomen pelvis for dissection with and without contrast showed nonspecific mediastinal lymphadenopathy 03/23/2022, chest x-ray showed no acute process.  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   Procedure: RIGHT TOTAL KNEE ARTHROPLASTY;  Surgeon: Leandrew Koyanagi, MD;  Location: [facility];  Service: Orthopedics;  Laterality: Right;    SOCIAL HISTORY: Social History   Socioeconomic History   Marital status: Widowed    Spouse name: Not on file   Number of children: 4   Years of education: Not on file   Highest education level: Not on file  Occupational History   Occupation: Service work--mechanic    Comment: tired  Tobacco Use   Smoking status: Former    Packs/day: 1.00    Years: 40.00    Total pack years: 40.00    Types: Cigarettes  Quit date: 35    Years since quitting: 33.4   Smokeless tobacco: Former    Types: Chew    Quit date: 08/21/2021  Vaping Use   Vaping Use: Never used  Substance and Sexual Activity   Alcohol use: No    Alcohol/week: 0.0 standard drinks of alcohol   Drug use: Never   Sexual activity: Not on file  Other Topics Concern   Not on file  Social History Narrative   Widowed 1/22      Has living will   Daughter [name] is health care POA   Would accept  resuscitation attempts--but no prolonged artificial ventilation   No tube feeds if cognitively unaware   Social Determinants of Health   Financial Resource Strain: Not on file  Food Insecurity: Not on file  Transportation Needs: Not on file  Physical Activity: Not on file  Stress: Not on file  Social Connections: Not on file  Intimate Partner Violence: Not on file    FAMILY HISTORY: Family History  Problem Relation Age of Onset   Cancer Mother    Breast cancer Mother    Arthritis Mother    Aneurysm Father    Diabetes Sister    Diabetes Brother    Heart disease Brother        CABG   Diabetes Brother    Diabetes Brother     ALLERGIES:  is allergic to oxycodone.  MEDICATIONS:  No current outpatient medications on file.   No current facility-administered medications for this visit.  Patient does not take any medications currently.  Review of Systems  Constitutional:  Negative for appetite change, chills, fatigue, fever and unexpected weight change.  HENT:   Negative for hearing loss and voice change.   Eyes:  Negative for eye problems and icterus.  Respiratory:  Positive for cough. Negative for chest tightness and shortness of breath.   Cardiovascular:  Negative for chest pain and leg swelling.  Gastrointestinal:  Negative for abdominal distention and abdominal pain.  Endocrine: Negative for hot flashes.  Genitourinary:  Negative for difficulty urinating, dysuria and frequency.   Musculoskeletal:  Negative for arthralgias.  Skin:  Negative for itching and rash.  Neurological:  Negative for light-headedness and numbness.  Hematological:  Negative for adenopathy. Does not bruise/bleed easily.  Psychiatric/Behavioral:  Negative for confusion.      PHYSICAL EXAMINATION: ECOG PERFORMANCE STATUS: 1 - Symptomatic but completely ambulatory  Vitals:   04/01/22 1125  BP: 134/76  Pulse: (!) 59  Resp: 18  Temp: (!) 97.2 F (36.2 C)   Filed Weights   04/01/22 1125   Weight: 258 lb 3.2 oz (117.1 kg)    Physical Exam Constitutional:      General: He is not in acute distress.    Appearance: He is obese. He is not diaphoretic.  HENT:     Head: Normocephalic and atraumatic.     Nose: Nose normal.     Mouth/Throat:     Pharynx: No oropharyngeal exudate.  Eyes:     General: No scleral icterus.    Pupils: Pupils are equal, round, and reactive to light.  Cardiovascular:     Rate and Rhythm: Normal rate and regular rhythm.     Heart sounds: No murmur heard. Pulmonary:     Effort: Pulmonary effort is normal. No respiratory distress.     Breath sounds: No rales.  Chest:     Chest wall: No tenderness.  Abdominal:     General: There is no  distension.     Palpations: Abdomen is soft.     Tenderness: There is no abdominal tenderness.  Musculoskeletal:        General: Normal range of motion.     Cervical back: Normal range of motion and neck supple.  Skin:    General: Skin is warm and dry.     Findings: No erythema.  Neurological:     Mental Status: He is alert and oriented to person, place, and time.     Cranial Nerves: No cranial nerve deficit.     Motor: No abnormal muscle tone.     Coordination: Coordination normal.  Psychiatric:        Mood and Affect: Affect normal.      LABORATORY DATA:  I have reviewed the data as listed Lab Results  Component Value Date   WBC 8.3 03/28/2022   HGB 15.4 03/28/2022   HCT 44.3 03/28/2022   MCV 92 03/28/2022   PLT 259 03/28/2022   Recent Labs    09/14/21 1124 09/18/21 0338 10/24/21 1549 10/24/21 1701 03/19/22 1539 03/28/22 0941  NA 137 136 136 137 138 137  K 4.3 4.4 4.5 4.5 4.9 4.9  CL 105 101 102 101 103 102  CO2 '25 28 27  ' --  22 21  GLUCOSE 142* 141* 122* 111* 120* 136*  BUN '17 15 16 21 16 16  ' CREATININE 0.79 0.96 0.83 0.70 0.82 0.81  CALCIUM 8.9 8.8* 9.2  --  9.7 9.3  GFRNONAA >60 >60 >60  --   --   --   PROT 6.8  --  7.7  --  7.4 6.8  ALBUMIN 3.6  --  3.6  --  4.5 4.1  AST 22  --   23  --  21 19  ALT 22  --  25  --  21 20  ALKPHOS 102  --  128*  --  140* 118  BILITOT 0.8  --  1.1  --  0.6 0.5    RADIOGRAPHIC STUDIES: I have personally reviewed the radiological images as listed and agreed with the findings in the report. DG Chest 2 View  Result Date: 03/23/2022 CLINICAL DATA:  chronic cough chronic cough EXAM: CHEST - 2 VIEW COMPARISON:  Radiographs dated September 10, 2021 FINDINGS: The cardiomediastinal silhouette is unchanged in contour. No pleural effusion. No pneumothorax. No acute pleuroparenchymal abnormality. Visualized abdomen is unremarkable. Mild degenerative changes of the thoracic spine. IMPRESSION: No acute cardiopulmonary abnormality. Electronically Signed   By: Valentino Saxon M.D.   On: 03/23/2022 16:54

## 2022-04-01 NOTE — Assessment & Plan Note (Signed)
Leukocytosis intermittently, likely reactive in nature. Recent CBC showed normalization of white count.  I recommend observation and repeat CBC in 4 to 6 weeks.

## 2022-05-01 ENCOUNTER — Ambulatory Visit
Admission: RE | Admit: 2022-05-01 | Discharge: 2022-05-01 | Disposition: A | Discharge status: Home / Self Care | Payer: PPO | Source: Ambulatory Visit | Attending: Oncology | Admitting: Oncology

## 2022-05-01 ENCOUNTER — Inpatient Hospital Stay: Payer: PPO | Attending: Oncology

## 2022-05-01 DIAGNOSIS — R590 Localized enlarged lymph nodes: Secondary | ICD-10-CM | POA: Insufficient documentation

## 2022-05-01 DIAGNOSIS — J984 Other disorders of lung: Secondary | ICD-10-CM | POA: Diagnosis not present

## 2022-05-01 DIAGNOSIS — D72829 Elevated white blood cell count, unspecified: Secondary | ICD-10-CM | POA: Insufficient documentation

## 2022-05-01 DIAGNOSIS — R911 Solitary pulmonary nodule: Secondary | ICD-10-CM | POA: Diagnosis not present

## 2022-05-01 LAB — CBC WITH DIFFERENTIAL/PLATELET
Abs Immature Granulocytes: 0.03 10*3/uL (ref 0.00–0.07)
Basophils Absolute: 0 10*3/uL (ref 0.0–0.1)
Basophils Relative: 0 %
Eosinophils Absolute: 0.3 10*3/uL (ref 0.0–0.5)
Eosinophils Relative: 3 %
HCT: 46.6 % (ref 39.0–52.0)
Hemoglobin: 15.6 g/dL (ref 13.0–17.0)
Immature Granulocytes: 0 %
Lymphocytes Relative: 41 %
Lymphs Abs: 3.9 10*3/uL (ref 0.7–4.0)
MCH: 31.7 pg (ref 26.0–34.0)
MCHC: 33.5 g/dL (ref 30.0–36.0)
MCV: 94.7 fL (ref 80.0–100.0)
Monocytes Absolute: 0.9 10*3/uL (ref 0.1–1.0)
Monocytes Relative: 10 %
Neutro Abs: 4.3 10*3/uL (ref 1.7–7.7)
Neutrophils Relative %: 46 %
Platelets: 248 10*3/uL (ref 150–400)
RBC: 4.92 MIL/uL (ref 4.22–5.81)
RDW: 12.8 % (ref 11.5–15.5)
WBC: 9.5 10*3/uL (ref 4.0–10.5)
nRBC: 0 % (ref 0.0–0.2)

## 2022-05-01 MED ORDER — IOHEXOL 300 MG/ML  SOLN
80.0000 mL | Freq: Once | INTRAMUSCULAR | Status: AC | PRN
  Administered 2022-05-01: 80 mL via INTRAVENOUS

## 2022-05-13 ENCOUNTER — Telehealth: Payer: Self-pay

## 2022-05-13 DIAGNOSIS — R590 Localized enlarged lymph nodes: Secondary | ICD-10-CM

## 2022-05-13 NOTE — Telephone Encounter (Signed)
-----   Message from Earlie Server, MD sent at 05/11/2022  4:12 PM EDT ----- Please let patient know that his CT showed stable lymph nodes, likely not cancer. I recommend to repeat CT chest w contrast in 6 months. And follow up with me a few days later after CT. [MD only]

## 2022-05-13 NOTE — Telephone Encounter (Signed)
Called and left detailed message informing patient of CT results and Dr. Collie Siad recommendation to do follow up CT in 6 moths with MD visit 2 days after. Advised to a call back with any questions or concerns.    [place], please schedule patient CT chest w contrast in 6 months. MD 2 days after. Please notify patient of appt. Thanks

## 2022-10-03 DIAGNOSIS — H903 Sensorineural hearing loss, bilateral: Secondary | ICD-10-CM | POA: Diagnosis not present

## 2022-10-09 DIAGNOSIS — H903 Sensorineural hearing loss, bilateral: Secondary | ICD-10-CM | POA: Diagnosis not present

## 2022-10-31 ENCOUNTER — Telehealth: Payer: PPO | Admitting: Physician Assistant

## 2022-10-31 DIAGNOSIS — J069 Acute upper respiratory infection, unspecified: Secondary | ICD-10-CM | POA: Diagnosis not present

## 2022-10-31 MED ORDER — BENZONATATE 100 MG PO CAPS: 100.0000 mg | ORAL_CAPSULE | Freq: Three times a day (TID) | ORAL | 0 refills | Status: DC | PRN

## 2022-10-31 NOTE — Progress Notes (Signed)

## 2022-10-31 NOTE — Progress Notes (Signed)
I have spent 5 minutes in review of e-visit questionnaire, review and updating patient chart, medical decision making and response to patient.   Kam Kushnir Cody Cylie Dor, PA-C    

## 2022-11-11 ENCOUNTER — Ambulatory Visit: Payer: PPO

## 2022-11-13 ENCOUNTER — Inpatient Hospital Stay: Payer: PPO | Admitting: Oncology

## 2022-11-23 IMAGING — DX DG KNEE 1-2V PORT*R*
1 series · 2 of 2 positions shown · non-contrast
Comparison: None.

CLINICAL DATA: Total knee arthroplasty

EXAM:
PORTABLE RIGHT KNEE - 1-2 VIEW

[Series 1: knee · 0.14mm/px · 2 of 2 slices shown]
[im 1/2]
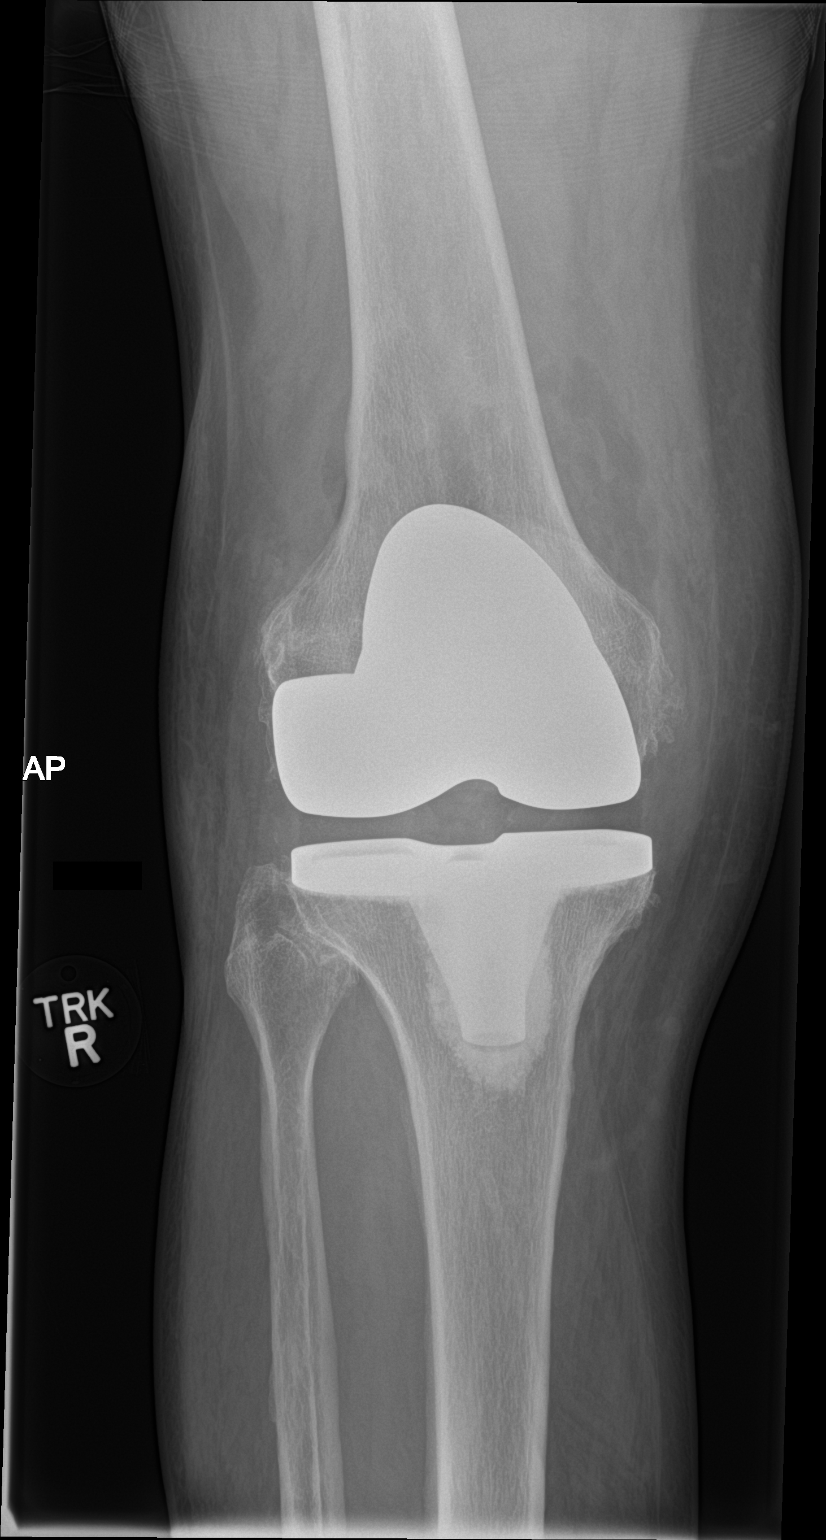
[im 2/2]
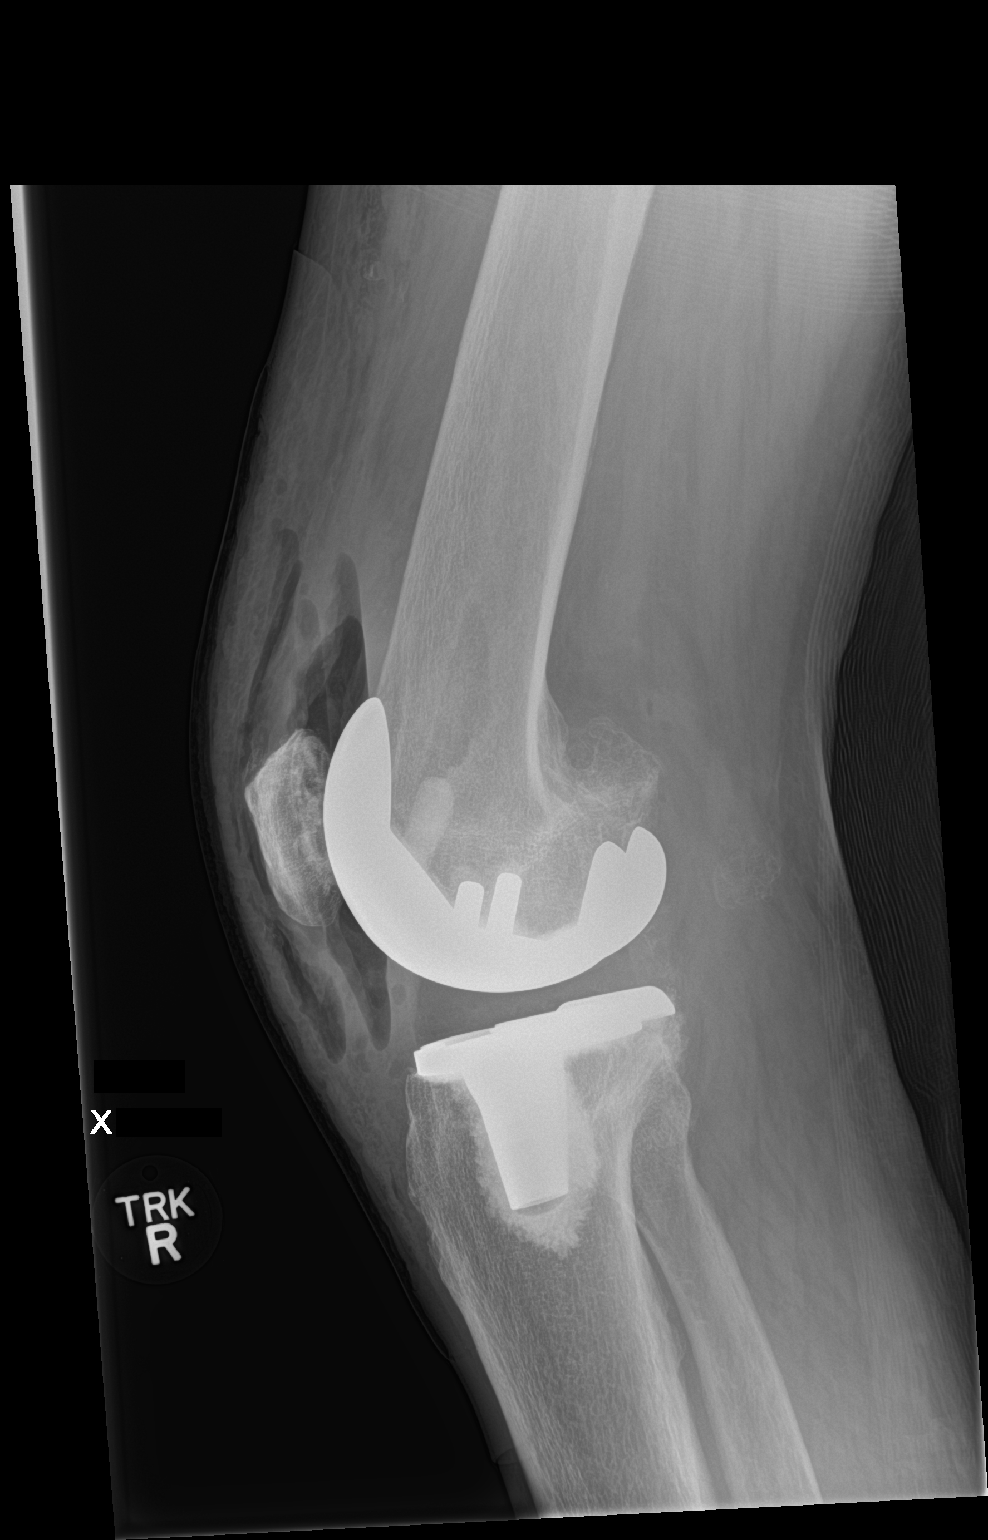

[2 of 2 positions shown; findings below may reference images not displayed]

FINDINGS: Post right total knee arthroplasty. Postoperative soft tissue
swelling and air. No evidence of complication.
IMPRESSION: Standard appearance post right knee arthroplasty.

## 2022-12-02 ENCOUNTER — Ambulatory Visit: Payer: PPO | Admitting: Family Medicine

## 2022-12-03 ENCOUNTER — Ambulatory Visit: Payer: PPO | Admitting: Family Medicine

## 2022-12-04 ENCOUNTER — Ambulatory Visit (INDEPENDENT_AMBULATORY_CARE_PROVIDER_SITE_OTHER): Payer: PPO | Admitting: Family Medicine

## 2022-12-04 ENCOUNTER — Encounter: Payer: Self-pay | Admitting: Family Medicine

## 2022-12-04 VITALS — BP 132/80 | HR 80 | Resp 18 | Ht 74.0 in | Wt 264.0 lb

## 2022-12-04 DIAGNOSIS — J069 Acute upper respiratory infection, unspecified: Secondary | ICD-10-CM

## 2022-12-04 DIAGNOSIS — R051 Acute cough: Secondary | ICD-10-CM

## 2022-12-04 DIAGNOSIS — L299 Pruritus, unspecified: Secondary | ICD-10-CM | POA: Diagnosis not present

## 2022-12-04 DIAGNOSIS — M25551 Pain in right hip: Secondary | ICD-10-CM | POA: Diagnosis not present

## 2022-12-04 MED ORDER — AMOXICILLIN 875 MG PO TABS: 875.0000 mg | ORAL_TABLET | Freq: Two times a day (BID) | ORAL | 0 refills | Status: DC

## 2022-12-04 MED ORDER — METHYLPREDNISOLONE 4 MG PO TBPK: ORAL_TABLET | ORAL | 0 refills | Status: DC

## 2022-12-04 MED ORDER — CLOBETASOL PROPIONATE 0.05 % EX CREA: 1.0000 | TOPICAL_CREAM | Freq: Two times a day (BID) | CUTANEOUS | 0 refills | Status: DC

## 2022-12-04 NOTE — Progress Notes (Signed)
Acute Office Visit  Subjective:     Patient ID: Bryan Thomas, male    DOB: April 01, 1941, 82 y.o.   MRN: [id number]  Chief Complaint  Patient presents with   Pruritis   Hip Pain    Right   Cough    Started 6 wks ago    HPI 82 year old male is in today for pruritus, right hip pain, and cough.  Pruritus initially started bilaterally on his back about 4 months ago.  His daughter looked at it and did not see anything, and it eventually went away.  The pruritus started again on his lower legs bilaterally several weeks ago.  It has been constant since it started with the right leg being itchier than the left leg.  He has been putting some of his wife's lotion on them with minimal relief.  They were quite red last week but the redness has been improving since then.  He denies any pain, new detergents or soaps, or exposure to known allergens.  His right hip started hurting 3 to 4 days ago.  Any type of movement is painful, with the motion going from sitting to standing being the most painful.  The pain has not improved or worsened since it started.  He denies any fever, warmth over the joint, weakness, paresthesias, or numbness in the right leg.  He did have a right knee replacement last year.  He has not tried ice, heat, or over-the-counter medication.  Patient initially had a viral URI about 6 weeks ago.  He developed congestion, fatigue, and a cough.  His symptoms gradually improved, but he does still have a dry cough.  He denies any sputum, fever, or rhinorrhea.  He states that his coughing fits seem to begin after more of a postnasal drip/tickle in his throat.  He did try a leftover prescription of Tessalon Perles with no relief.  He says that his cough has been improving over the past 6 weeks but it is still not gone.  ROS See HPI     Objective:    BP 132/80 (BP Location: Left Arm, Patient Position: Sitting, Cuff Size: Large)   Pulse 80   Resp 18   Ht 6' 2"$  (1.88 m)   Wt 264 lb  (119.7 kg)   SpO2 96%   BMI 33.90 kg/m    Physical Exam Constitutional:      General: He is not in acute distress.    Appearance: Normal appearance.  HENT:     Head: Normocephalic and atraumatic.  Eyes:     Extraocular Movements: Extraocular movements intact.     Conjunctiva/sclera: Conjunctivae normal.  Cardiovascular:     Rate and Rhythm: Normal rate and regular rhythm.     Pulses: Normal pulses.     Heart sounds: Normal heart sounds.  Pulmonary:     Effort: Pulmonary effort is normal.     Breath sounds: Normal breath sounds. No wheezing, rhonchi or rales.  Musculoskeletal:     Cervical back: Normal range of motion.  Skin:    General: Skin is warm and dry.     Findings: Rash (Bilateral shins, dry/flaky, mild erythema) present.  Neurological:     Mental Status: He is alert and oriented to person, place, and time.  Psychiatric:        Mood and Affect: Mood normal.      Assessment & Plan:   Problem List Items Addressed This Visit       Other   Cough  Relevant Medications   amoxicillin (AMOXIL) 875 MG tablet   methylPREDNISolone (MEDROL) 4 MG TBPK tablet   Other Visit Diagnoses     Pruritus    -  Primary   Relevant Medications   clobetasol cream (TEMOVATE) 0.05 %   Viral URI with cough       Relevant Medications   amoxicillin (AMOXIL) 875 MG tablet   methylPREDNISolone (MEDROL) 4 MG TBPK tablet   Right hip pain          With the cough lasting for about 6 weeks now, I sent in a prescription for amoxicillin 875 mg twice daily for 7 days and a Medrol pack.  We discussed the need to come back for a chest x-ray if symptoms fail to improve or get worse over the next 5 days or so.  Prescribed clobetasol cream for symptomatic relief of pruritus.  If the rash gets worse or fails to improve please let me know and come back in for follow-up or request referral to dermatology.  Given that his right hip pain is acute and he has not tried any conservative measures, we  discussed that he should start with icing his hip along with over-the-counter Tylenol every 4-6 hours as needed for pain.  If pain does not improve or worsens after a week or 2 of conservative measures, return for follow-up.  Meds ordered this encounter  Medications   clobetasol cream (TEMOVATE) 0.05 %    Sig: Apply 1 Application topically 2 (two) times daily.    Dispense:  30 g    Refill:  0    Order Specific Question:   Supervising Provider    Answer:   Beatrice Lecher D [[id number]]   amoxicillin (AMOXIL) 875 MG tablet    Sig: Take 1 tablet (875 mg total) by mouth 2 (two) times daily. Please be sure to finish the entire prescription even once you feel better.    Dispense:  14 tablet    Refill:  0    Order Specific Question:   Supervising Provider    Answer:   Beatrice Lecher D [[id number]]   methylPREDNISolone (MEDROL) 4 MG TBPK tablet    Sig: Take by mouth as directed for 6 days    Dispense:  21 tablet    Refill:  0    Order Specific Question:   Supervising Provider    Answer:   Hali Marry [[id number]]    Return if symptoms worsen or fail to improve.  I spent 34 minutes on the day of the encounter to include pre-visit record review, face-to-face time with the patient and post visit ordering of test.  Velva Harman, PA

## 2022-12-30 IMAGING — CT CT ANGIO CHEST-ABD-PELV FOR DISSECTION W/ AND WO/W CM
2 of 7 series · 13 of 46 positions shown, 15 images · IV contrast (APPLIED)
Comparison: CT chest 12/18/2000

CLINICAL DATA: Chest pain or back pain, aortic dissection
suspected. severe abdominal pain associated with dizziness and CP
upon exertion. R/O AAA

EXAM:
CT ANGIOGRAPHY CHEST, ABDOMEN AND PELVIS
TECHNIQUE: Non-contrast CT of the chest was initially obtained.

[Series 7: arterial · axial · arterial · 0.76mm/px · z∈[+1016,+1656]mm · 10 of 358 slices shown, 12 images]
[im 19/358  soft-tissue]
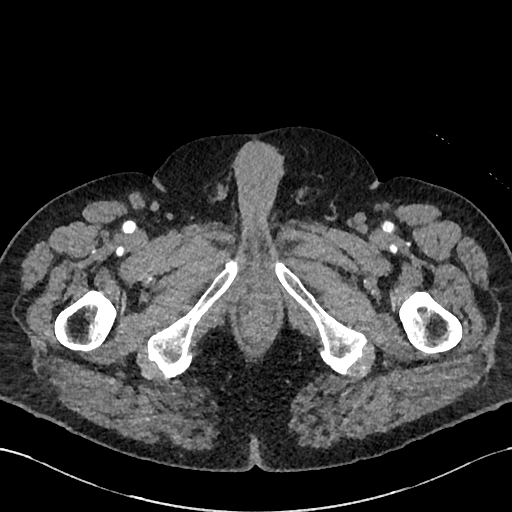
[im 19/358  bone]
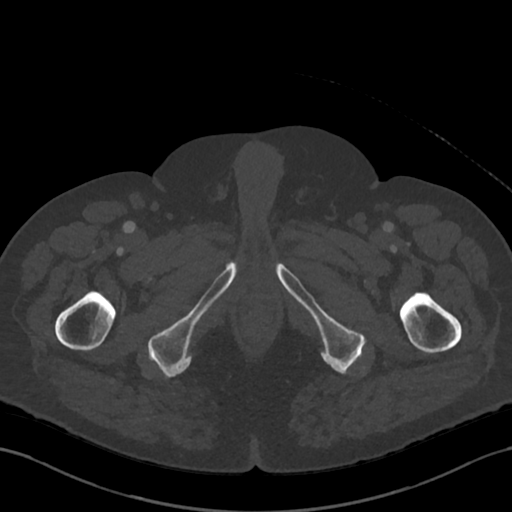
[im 57/358  soft-tissue]
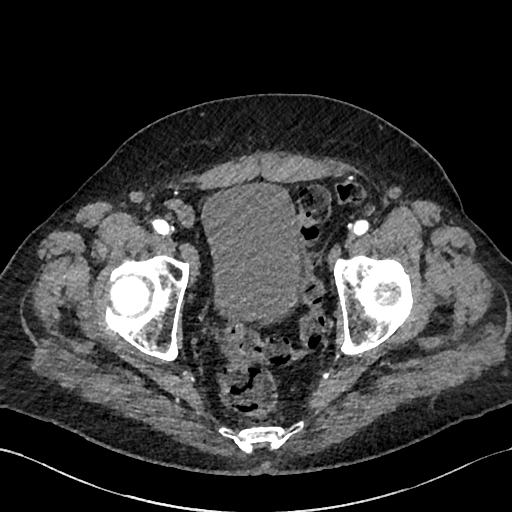
[im 94/358  soft-tissue]
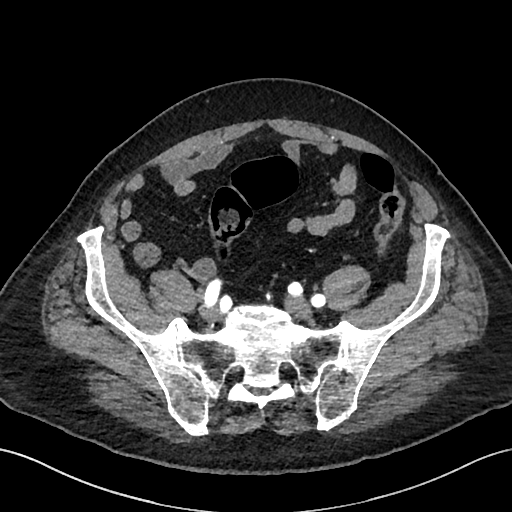
[im 132/358  soft-tissue]
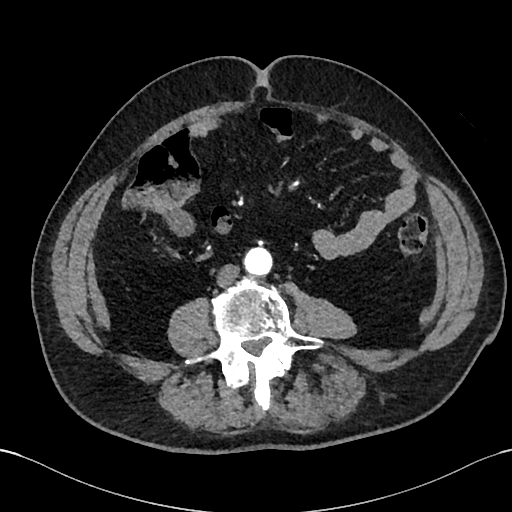
[im 170/358  soft-tissue]
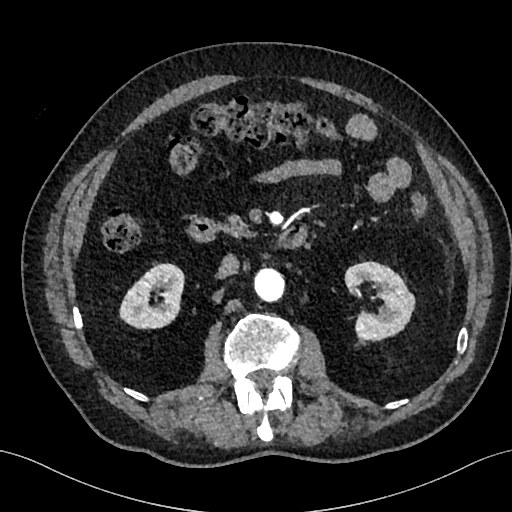
[im 188/358  soft-tissue]
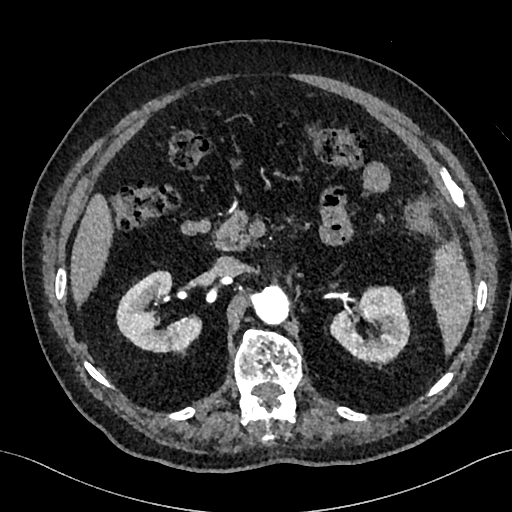
[im 226/358  soft-tissue]
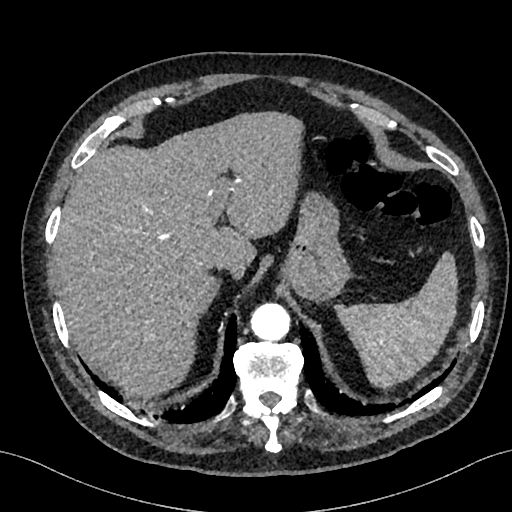
[im 264/358  soft-tissue]
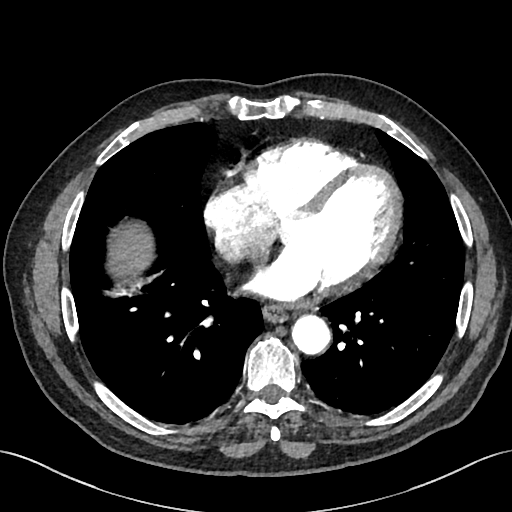
[im 301/358  soft-tissue]
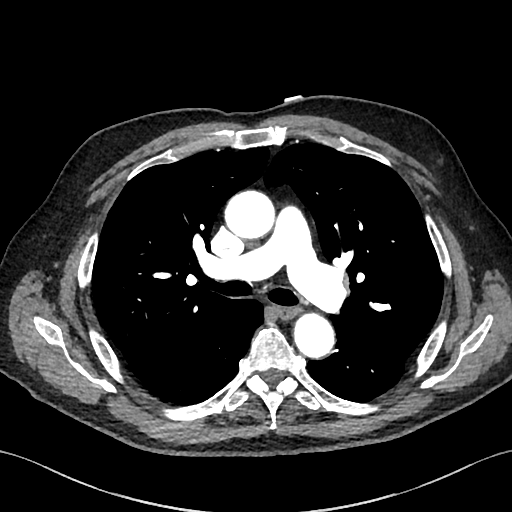
[im 301/358  bone]
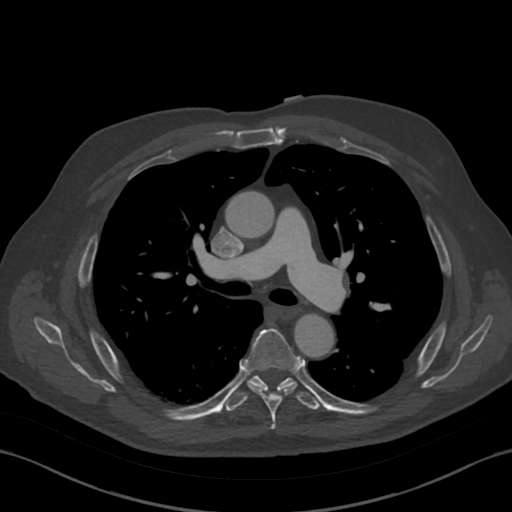
[im 339/358  soft-tissue]
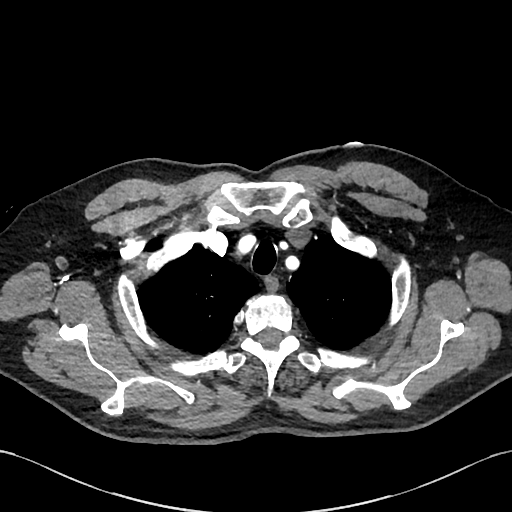

[Series 10: cor · coronal · 1.05mm/px · 3 of 187 slices shown]
[im 47/187  soft-tissue]
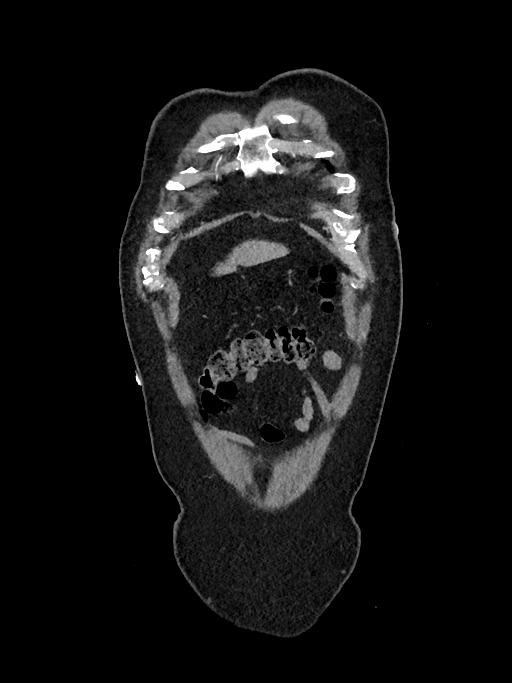
[im 94/187  soft-tissue]
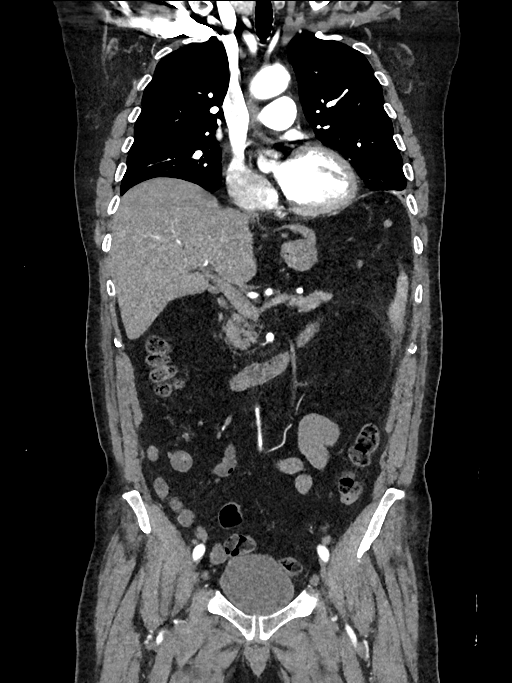
[im 140/187  soft-tissue]
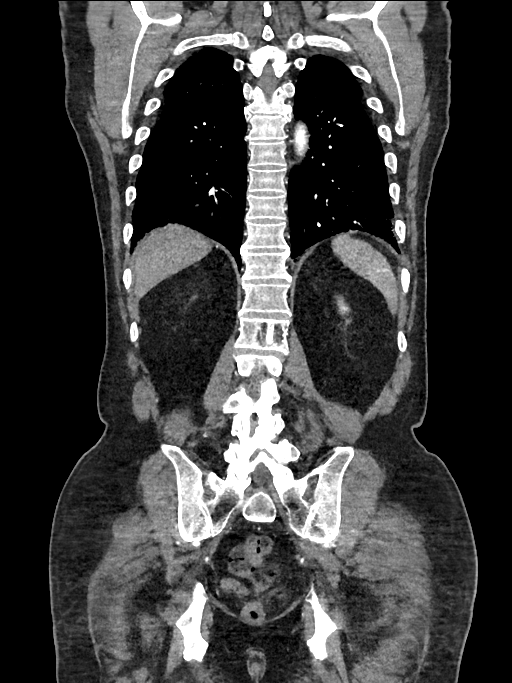

[13 of 46 positions shown; findings below may reference images not displayed]

Multidetector CT imaging through the chest, abdomen and pelvis was
performed using the standard protocol during bolus administration of
intravenous contrast. Multiplanar reconstructed images and MIPs were
obtained and reviewed to evaluate the vascular anatomy.

CONTRAST:  100mL OMNIPAQUE IOHEXOL 350 MG/ML SOLN
FINDINGS: CTA CHEST FINDINGS

Cardiovascular: Preferential opacification of the thoracic aorta. No
evidence of thoracic aortic aneurysm or dissection. Mild
atherosclerotic plaque of the thoracic aorta. At least 2 vessel
coronary artery calcifications. Normal heart size. No pericardial
effusion. The main pulmonary artery is normal in caliber. No
pulmonary embolus.

Mediastinum/Nodes:

Borderline enlarged precarinal lymph nodes measuring 1 cm ([DATE],
48). Posterior mediastinal mass versus conglomerate of lymph nodes
measuring 3 x 1.2 cm and abutting the esophagus. Prominent but
nonenlarged right hilar lymph node. No enlarged hilar or axillary
lymph nodes.

Heterogeneous appearing visualized thyroid glands. Not clinically
significant; no follow-up imaging recommended (ref: [HOSPITAL]. [DATE]): 143-50).

The trachea and esophagus demonstrate no significant findings. Small
hiatal hernia.

Lungs/Pleura: No focal consolidation. No pulmonary nodule. No
pulmonary mass. No pleural effusion. No pneumothorax.

Musculoskeletal:

No chest wall abnormality.

No suspicious lytic or blastic osseous lesions. No acute displaced
fracture. Multilevel degenerative changes of the spine.

Review of the MIP images confirms the above findings.

CTA ABDOMEN AND PELVIS FINDINGS

VASCULAR

Aorta: Mild atherosclerotic plaque. Normal caliber aorta without
aneurysm, dissection, vasculitis or significant stenosis.

Celiac: Patent without evidence of aneurysm, dissection, vasculitis
or significant stenosis.

SMA: Patent without evidence of aneurysm, dissection, vasculitis or
significant stenosis.

Renals: Both renal arteries are patent without evidence of aneurysm,
dissection, vasculitis, fibromuscular dysplasia or significant
stenosis.

IMA: Patent without evidence of aneurysm, dissection, vasculitis or
significant stenosis.

Inflow: Mild atherosclerotic plaque. Patent without evidence of
aneurysm, dissection, vasculitis or significant stenosis.

Veins: No obvious venous abnormality within the limitations of this
arterial phase study.

Review of the MIP images confirms the above findings.

NON-VASCULAR

Hepatobiliary: No focal liver abnormality. No gallstones,
gallbladder wall thickening, or pericholecystic fluid. No biliary
dilatation.

Pancreas: No focal lesion. Normal pancreatic contour. No surrounding
inflammatory changes. No main pancreatic ductal dilatation.

Spleen: Normal in size without focal abnormality.

Adrenals/Urinary Tract:

No adrenal nodule bilaterally.

Bilateral kidneys enhance symmetrically. Subcentimeter hypodensities
are too small to characterize.

No hydronephrosis. No hydroureter.

The urinary bladder is unremarkable.

Stomach/Bowel: Stomach is within normal limits. No evidence of bowel
wall thickening or dilatation. Appendix appears normal.

Lymphatic: No pulmonary embolus.

Reproductive: Prominent prostate.

Other: No intraperitoneal free fluid. No intraperitoneal free gas.
No organized fluid collection.

Musculoskeletal:

Small fat containing right inguinal hernia. Small left inguinal
hernia containing a short loop of large bowel. Abdominal defect of 3
cm on the left and 4 cm on the right.

No suspicious lytic or blastic osseous lesions. No acute displaced
fracture. L1 vertebral body hemangioma. Densely sclerotic lesion
within the L2 vertebral body likely a bone island. Multilevel
mild-to-moderate degenerative changes of the spine.

Review of the MIP images confirms the above findings.
IMPRESSION: 1. No acute aortic abnormality. Aortic Atherosclerosis
([id number]).
2. No pulmonary embolus.
3. Nonspecific mediastinal lymphadenopathy. Comparison with prior
cross-sectional imaging would be of value given report of CT chest
12/18/2000.
[DATE]. Small left inguinal hernia containing a short loop of large
bowel. Small fat containing right inguinal hernia. No findings
suggest bowel obstruction or ischemia.
5. Small hiatal hernia.
6. Prominent prostate.

## 2023-01-08 ENCOUNTER — Ambulatory Visit (INDEPENDENT_AMBULATORY_CARE_PROVIDER_SITE_OTHER): Payer: PPO | Admitting: Family Medicine

## 2023-01-08 ENCOUNTER — Encounter: Payer: Self-pay | Admitting: Family Medicine

## 2023-01-08 ENCOUNTER — Emergency Department: Payer: PPO

## 2023-01-08 ENCOUNTER — Encounter: Payer: Self-pay | Admitting: Emergency Medicine

## 2023-01-08 ENCOUNTER — Other Ambulatory Visit: Payer: Self-pay

## 2023-01-08 ENCOUNTER — Emergency Department
Admission: EM | Admit: 2023-01-08 | Discharge: 2023-01-08 | Disposition: A | Discharge status: Home / Self Care | Payer: PPO | Attending: Emergency Medicine | Admitting: Emergency Medicine

## 2023-01-08 VITALS — BP 123/81 | HR 84 | Resp 18 | Ht 74.0 in | Wt 262.0 lb

## 2023-01-08 DIAGNOSIS — Z87891 Personal history of nicotine dependence: Secondary | ICD-10-CM | POA: Insufficient documentation

## 2023-01-08 DIAGNOSIS — R0602 Shortness of breath: Secondary | ICD-10-CM | POA: Diagnosis not present

## 2023-01-08 DIAGNOSIS — R079 Chest pain, unspecified: Secondary | ICD-10-CM

## 2023-01-08 DIAGNOSIS — R739 Hyperglycemia, unspecified: Secondary | ICD-10-CM | POA: Insufficient documentation

## 2023-01-08 DIAGNOSIS — G4489 Other headache syndrome: Secondary | ICD-10-CM | POA: Diagnosis not present

## 2023-01-08 DIAGNOSIS — R42 Dizziness and giddiness: Secondary | ICD-10-CM

## 2023-01-08 DIAGNOSIS — E1165 Type 2 diabetes mellitus with hyperglycemia: Secondary | ICD-10-CM | POA: Diagnosis not present

## 2023-01-08 DIAGNOSIS — R519 Headache, unspecified: Secondary | ICD-10-CM | POA: Diagnosis not present

## 2023-01-08 DIAGNOSIS — R0789 Other chest pain: Secondary | ICD-10-CM | POA: Insufficient documentation

## 2023-01-08 DIAGNOSIS — R053 Chronic cough: Secondary | ICD-10-CM | POA: Diagnosis not present

## 2023-01-08 LAB — BASIC METABOLIC PANEL
Anion gap: 5 (ref 5–15)
BUN: 18 mg/dL (ref 8–23)
CO2: 24 mmol/L (ref 22–32)
Calcium: 9 mg/dL (ref 8.9–10.3)
Chloride: 106 mmol/L (ref 98–111)
Creatinine, Ser: 0.92 mg/dL (ref 0.61–1.24)
GFR, Estimated: >60 mL/min (ref >60)
Glucose, Bld: 253 mg/dL — ABNORMAL HIGH (ref 70–99)
Potassium: 4.1 mmol/L (ref 3.5–5.1)
Sodium: 135 mmol/L (ref 135–145)

## 2023-01-08 LAB — CBC
HCT: 47.5 % (ref 39.0–52.0)
Hemoglobin: 16.1 g/dL (ref 13.0–17.0)
MCH: 32.3 pg (ref 26.0–34.0)
MCHC: 33.9 g/dL (ref 30.0–36.0)
MCV: 95.2 fL (ref 80.0–100.0)
Platelets: 297 10*3/uL (ref 150–400)
RBC: 4.99 MIL/uL (ref 4.22–5.81)
RDW: 13 % (ref 11.5–15.5)
WBC: 10.8 10*3/uL — ABNORMAL HIGH (ref 4.0–10.5)
nRBC: 0 % (ref 0.0–0.2)

## 2023-01-08 LAB — TROPONIN I (HIGH SENSITIVITY)
Troponin I (High Sensitivity): 7 ng/L (ref <18)
Troponin I (High Sensitivity): 7 ng/L (ref <18)

## 2023-01-08 MED ORDER — SODIUM CHLORIDE 0.9 % IV BOLUS
1000.0000 mL | Freq: Once | INTRAVENOUS | Status: AC
  Administered 2023-01-08: 1000 mL via INTRAVENOUS

## 2023-01-08 MED ORDER — METOCLOPRAMIDE HCL 5 MG/ML IJ SOLN
10.0000 mg | Freq: Once | INTRAMUSCULAR | Status: AC
  Administered 2023-01-08: 10 mg via INTRAVENOUS
  Filled 2023-01-08: qty 2

## 2023-01-08 MED ORDER — ACETAMINOPHEN 500 MG PO TABS
1000.0000 mg | ORAL_TABLET | Freq: Once | ORAL | Status: AC
  Administered 2023-01-08: 1000 mg via ORAL
  Filled 2023-01-08: qty 2

## 2023-01-08 MED ORDER — DIPHENHYDRAMINE HCL 50 MG/ML IJ SOLN
12.5000 mg | Freq: Once | INTRAMUSCULAR | Status: AC
  Administered 2023-01-08: 12.5 mg via INTRAVENOUS
  Filled 2023-01-08: qty 1

## 2023-01-08 NOTE — Assessment & Plan Note (Signed)
Describes a 'discomfort' in chest at rest lasting over an hour. Has been ongoing for several days - sent pt to [facility] ED for chest pain evaluation. Daughter drove him.   - will likely need trops, EKG, and imaging in the ED.  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  No ST changes.   - if pt does not get high resolution chest CT in ED, consider ordering on o/p basis given his previous findings in 2023 in which they recommended 6 month f/u scan.  - pending what happens at ED, pt will likley need echo and cardiology referral given findings on previous imaging consistent with pulmonary hypertension.

## 2023-01-08 NOTE — ED Provider Notes (Signed)
[facility] Provider Note    Event Date/Time   First MD Initiated Contact with Patient 01/08/23 1539     (approximate)   History   Chest Pain   HPI  Bryan Thomas is a 82 y.o. male   Past medical history of former smoker, osteoarthritis, presents to the emergency department with left-sided chest pain that is sharp and radiating to the left arm that happens while at rest, nonexertional, and last for several seconds at a time before spontaneously resolving.  There is no associated shortness of breath, nausea sweating or any other symptoms.  There does not appear to be any obvious inciting events.  He also states that he has had some mild orthostatic lightheadedness for 1 week.  No associated nausea vomiting or diarrhea and his p.o. intake has been adequate.  During this time he has also had a mild headache.  He denies vision changes, neck pain, fever, trauma.     Independent Historian contributed to assessment above: His family member who is at bedside  External record review-I reviewed office visit from Dr. Jeannine Kitten of family medicine earlier today denoting his chest pain and asking him to come to the emergency department for further evaluation      Physical Exam   Triage Vital Signs: ED Triage Vitals  Enc Vitals Group     BP 01/08/23 1250 117/81     Pulse Rate 01/08/23 1250 83     Resp 01/08/23 1250 18     Temp 01/08/23 1250 98.6 F (37 C)     Temp Source 01/08/23 1250 Oral     SpO2 01/08/23 1250 93 %     Weight --      Height --      Head Circumference --      Peak Flow --      Pain Score 01/08/23 1251 0     Pain Loc --      Pain Edu? --      Excl. in [place]? --     Most recent vital signs: Vitals:   01/08/23 1600 01/08/23 1630  BP: (!) 148/87 139/88  Pulse: 70 68  Resp: 18 13  Temp:    SpO2: 91% 92%    General: Awake, no distress.  CV:  Good peripheral perfusion.  Resp:  Normal effort.  Abd:  No distention.  Other:  Wake alert  comfortable appearing with normal vital signs.  Skin appears warm and well-perfused.  He has no tenderness to palpation of the abdomen or chest wall and his lungs are clear with no focality or wheezing.  Motor sensor intact no dysarthria no facial asymmetry normal finger-nose testing.  No temporal tenderness and neck is supple with full range of motion.   ED Results / Procedures / Treatments   Labs (all labs ordered are listed, but only abnormal results are displayed) Labs Reviewed  BASIC METABOLIC PANEL - Abnormal; Notable for the following components:      Result Value   Glucose, Bld 253 (*)    All other components within normal limits  CBC - Abnormal; Notable for the following components:   WBC 10.8 (*)    All other components within normal limits  TROPONIN I (HIGH SENSITIVITY)  TROPONIN I (HIGH SENSITIVITY)     I ordered and reviewed the above labs they are notable for blood glucose is elevated at 250, white blood cell count slightly high at 10.8 and negative troponins  EKG  ED ECG  REPORT I, Lucillie Garfinkel, the attending physician, personally viewed and interpreted this ECG.   Date: 01/08/2023  EKG Time: 1254  Rate: 82  Rhythm: nsr  Axis: nl  Intervals: Incomplete right bundle branch block  ST&T Change: No acute ischemic changes    RADIOLOGY I independently reviewed and interpreted CT of the head see no obvious bleeding or midline shift   PROCEDURES:  Critical Care performed: No  Procedures   MEDICATIONS ORDERED IN ED: Medications  acetaminophen (TYLENOL) tablet 1,000 mg (1,000 mg Oral Given 01/08/23 1721)  metoCLOPramide (REGLAN) injection 10 mg (10 mg Intravenous Given 01/08/23 1718)  diphenhydrAMINE (BENADRYL) injection 12.5 mg (12.5 mg Intravenous Given 01/08/23 1720)  sodium chloride 0.9 % bolus 1,000 mL (0 mLs Intravenous Stopped 01/08/23 1832)    IMPRESSION / MDM / ASSESSMENT AND PLAN / ED COURSE  I reviewed the triage vital signs and the nursing notes.                                 Patient's presentation is most consistent with acute presentation with potential threat to life or bodily function.  Differential diagnosis includes, but is not limited to, ACS, PE, dissection, CVA, intracranial bleeding, electrolyte disturbance, dehydration   The patient is on the cardiac monitor to evaluate for evidence of arrhythmia and/or significant heart rate changes.  MDM: This patient has multiple chief complaints that require workup today in the emergency department.  In terms of his chest pain to be atypical for ACS, PE, dissection.  Given his age and no cardiologist or heart workup recently I will check an EKG which is fortunately nonischemic and serial troponins which are flat.  I doubt ACS at this time.  Would be very atypical for PE or dissection, defer workup for these etiologies.  Referral to cardiology for further workup of atypical chest pain.  His dizziness is orthostatic in nature and he has no other focal neurologic deficits to suggest CVA.  Will give some fluids.  When I stand him up at bedside today he is not dizzy at all.  CT of the head for new onset headache is negative for intracranial bleeding.  No temporal tenderness to suggest inflammatory conditions, give migraine cocktail.  New hyperglycemia, asked the patient to follow-up with PMD to recheck these levels and diabetes management as per needed.  I considered hospitalization for admission or observation however given asymptomatic at this time and stable in the emergency department with negative workup as above, I think outpatient monitoring and follow-up is most appropriate at this time.  Patient has established primary care doctor.  He will return with any new or worsening symptoms.        FINAL CLINICAL IMPRESSION(S) / ED DIAGNOSES   Final diagnoses:  Nonspecific chest pain  Nonintractable headache, unspecified chronicity pattern, unspecified headache type  Dizziness   Hyperglycemia     Rx / DC Orders   ED Discharge Orders          Ordered    Ambulatory referral to Cardiology        01/08/23 1757             Note:  This document was prepared using Dragon voice recognition software and may include unintentional dictation errors.Lucillie Garfinkel, MD 01/08/23 2056

## 2023-01-08 NOTE — Assessment & Plan Note (Signed)
Chronic cough for several years.  Does not appear to have emphysema.  Quit smoking several decades ago so copd less likely.  Most likely cause at this time would probably be cardiogenic from his possible pulmonary arterial hypertension.  I could appreciate bibasilar inspiratory crackles on exam.   - if it does not appear to be d/t his cardiac issues, I would consider further workup with pulmonology.   - typical causes of chronic cough such as allergies, GERD.Marland Kitchen do not appear to be the most likely cause.

## 2023-01-08 NOTE — Assessment & Plan Note (Signed)
Orthostatics and EKG negative.   Seems dizziness and brief blurred vision are presyncopal symptoms, most likely cardiogenic but would need further workup.  - consider ordering echo if not done in the hospital.

## 2023-01-08 NOTE — Discharge Instructions (Addendum)
Take acetaminophen 650 mg and ibuprofen 400 mg every 6 hours for pain.  Take with food.  Do not take these medications for more than 3-5 days, if you continue to have a headache see your doctor for further medication recommendations.  I made a referral to the heart doctor to evaluate you further for your chest pain.  Drink Plenty of fluids to stay well-hydrated.  Speak with your primary doctor about your blood sugar which was elevated today which may be a sign of diabetes.  They may start you on medications as needed if your blood sugar remains high.

## 2023-01-08 NOTE — ED Triage Notes (Signed)
Patient to ED for chest discomfort on the left side along with headache and dizziness. Symptoms ongoing since last Friday. Denies cardiac history. Patient states PCP MD states he had EKG changes.

## 2023-01-08 NOTE — Patient Instructions (Signed)
It was nice to meet you today,   I want you to go to the emergency department at [facility] to be evaluated for chest pain and shortness of breath.    Please let us know if you are unable to do that.    Thank you,   Dr. Jeannine Kitten

## 2023-01-08 NOTE — Assessment & Plan Note (Signed)
Uncertain if this is related to his cardiovascular issues or not.  Similar symptoms 10 months ago but pt denied hx of migraines.   - if not done in the ED, consider CT head given new onset headache after age 82.   - no hx of falls. Not on blood thinner.

## 2023-01-08 NOTE — Progress Notes (Signed)
Acute Office Visit  Subjective:     Patient ID: Bryan Thomas, male    DOB: 1941/09/27, 82 y.o.   MRN: [id number]  Chief Complaint  Patient presents with   Fatigue        Headache   Dizziness         HPI Patient is in today for complaints of chest pressure, described as a 'funny feeling' in his chest, blurred vision and dizziness upon standing.  Also complaining of headache.    The chest discomfort he states comes and goes and can happen at rest.  He states it usually lasts 2-3 hours before resolving.  Does not describe it as a pain.  No nausea or diaphoresis.    Headache started last Friday.  Describes it as a dull ache behind his eyes that comes and goes.  No hx of migraines. No photophobia or nausea.  He endorses blurred vision usually on standing that lasts a few minutes but no double vision.    Pt denies light headededness but when he got on the examining table he did endorse dizziness at that time.  No vertigo.      Patient has had a cough for the past 2-3 years that is sometimes productive of 'phlegm'.  He has not smoked cigarettes since 1990.  Denies sore throat, fevers/chillls, n/v/d/abd pain.  Endorses decreased energy but no decrease in appetite.     Pt lives by himself and is active at baseline.  Works on cars a lot in his free time in his garage.  Typically ambulates with a cane or no assistive device.  No syncopal episodes.      Interestingly, in may 2023 the patient was seen in this office for similar symptoms minus the chest discomfort.  At that time the EKG and orthostatics were negative but the pt was advised to go thte ED and refused.  Today the pt states the reason he refused was that he went to the ED at [facility] once and waited over 24 hours in the ED.  Today the patient is willing to go to  [facility] for further evaluatoin of chest discomfort.    ROS      Objective:    BP 123/81 (BP Location: Left Arm, Patient Position: Sitting, Cuff Size:  Large)   Pulse 84   Resp 18   Ht 6\' 2"  (1.88 m)   Wt 262 lb (118.8 kg)   SpO2 94%   BMI 33.64 kg/m    Physical Exam  Gen: alert, oriented.  Fatigue with mild exertion.   Heent: perrla. Eomi.  Moist oral mucosa.   Cv: rrr. No murmurs.   Pulm: bibasilar inspiratory crackles.  Increased work of breathing with exertion.  Cough.   Gi: soft, nontender Neuro: cn 2-12 grossly intact.  No FND. Normal sensatoin  No results found for any visits on 01/08/23.      Assessment & Plan:   Problem List Items Addressed This Visit       Other   Chest pain - Primary    Describes a 'discomfort' in chest at rest lasting over an hour. Has been ongoing for several days - sent pt to [facility] ED for chest pain evaluation. Daughter drove him.   - will likely need trops, EKG, and imaging in the ED.  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  No ST changes.   - if pt does not get high resolution chest CT in ED,  consider ordering on o/p basis given his previous findings in 2023 in which they recommended 6 month f/u scan.  - pending what happens at ED, pt will likley need echo and cardiology referral given findings on previous imaging consistent with pulmonary hypertension.         Relevant Orders   EKG 12-Lead (Completed)   Cough    Chronic cough for several years.  Does not appear to have emphysema.  Quit smoking several decades ago so copd less likely.  Most likely cause at this time would probably be cardiogenic from his possible pulmonary arterial hypertension.  I could appreciate bibasilar inspiratory crackles on exam.   - if it does not appear to be d/t his cardiac issues, I would consider further workup with pulmonology.   - typical causes of chronic cough such as allergies, GERD.Marland Kitchen do not appear to be the most likely cause.       Dizziness    Orthostatics and EKG negative.   Seems dizziness and brief blurred vision are presyncopal symptoms, most likely cardiogenic but would need  further workup.  - consider ordering echo if not done in the hospital.        Relevant Orders   EKG 12-Lead (Completed)   Other headache syndrome    Uncertain if this is related to his cardiovascular issues or not.  Similar symptoms 10 months ago but pt denied hx of migraines.   - if not done in the ED, consider CT head given new onset headache after age 54.   - no hx of falls. Not on blood thinner.         Other Visit Diagnoses     SOB (shortness of breath)       Relevant Orders   EKG 12-Lead (Completed)       No orders of the defined types were placed in this encounter.   Return in about 1 week (around 01/15/2023) for dizziness, further cardiac workup. Benay Pike, MD

## 2023-01-10 ENCOUNTER — Ambulatory Visit: Payer: PPO | Admitting: Cardiology

## 2023-01-14 ENCOUNTER — Telehealth: Payer: Self-pay

## 2023-01-14 NOTE — Telephone Encounter (Signed)
        Patient  visited [place] on 3/20   Telephone encounter attempt :  1st  A HIPAA compliant voice message was left requesting a return call.  Instructed patient to call back.    [place] [phone] [address], [place], [place] [address] Phone: [phone] Email: [email or web address]

## 2023-01-15 ENCOUNTER — Encounter: Payer: Self-pay | Admitting: Family Medicine

## 2023-01-15 ENCOUNTER — Ambulatory Visit (INDEPENDENT_AMBULATORY_CARE_PROVIDER_SITE_OTHER): Payer: PPO | Admitting: Family Medicine

## 2023-01-15 ENCOUNTER — Telehealth: Payer: Self-pay

## 2023-01-15 VITALS — BP 111/75 | HR 76 | Ht 74.0 in | Wt 263.0 lb

## 2023-01-15 DIAGNOSIS — E1165 Type 2 diabetes mellitus with hyperglycemia: Secondary | ICD-10-CM

## 2023-01-15 DIAGNOSIS — R0609 Other forms of dyspnea: Secondary | ICD-10-CM | POA: Diagnosis not present

## 2023-01-15 DIAGNOSIS — R42 Dizziness and giddiness: Secondary | ICD-10-CM | POA: Diagnosis not present

## 2023-01-15 LAB — POCT GLYCOSYLATED HEMOGLOBIN (HGB A1C): Hemoglobin A1C: 7 % — AB (ref 4.0–5.6)

## 2023-01-15 MED ORDER — METFORMIN HCL ER 750 MG PO TB24: 750.0000 mg | ORAL_TABLET | Freq: Every day | ORAL | 0 refills | Status: DC

## 2023-01-15 NOTE — Progress Notes (Signed)
Acute Office Visit  Subjective:     Patient ID: Bryan Thomas, male    DOB: 10/21/1941, 82 y.o.   MRN: [id number]  Chief Complaint  Patient presents with   Follow-up    ER 320/24 for dizziness    HPI Patient is in today for follow up of dizziness and chest pain after his initial visit.  Patient states that he did not have any issues going to [facility] emergency department.  Was seen and discharged within 6 hours.  Since that time patient continues to have issues with dizziness, mostly upon standing.  Also complains of continued headache.  Was told to take Tylenol and ibuprofen but only for few days.  This offered minimal relief.  States his headache is not worse at any particular time.  Nothing triggers it.  Nothing relieves it.  Comes and goes on its own.  It is in the frontal part of his head.  Does not have a history of seasonal allergies.  Does not have a history of migraines.  Patient states that all of his siblings have diabetes.  We discussed his high blood sugar level at the hospital and we rechecked a point-of-care glucose.  He was high with a A1c of 7.0%.  We discussed starting medications.  Patient reluctant to start medications but willing to do it after we discussed the benefits and the risk of uncontrolled diabetes.  Patient is aware of these risks due to having several family members with complications from diabetes.  Patient daughter states that they have an appointment scheduled for May with a cardiologist.  That was the earliest they could see them.  Discussed with them the possibility of getting echocardiogram prior to the visit.  As the patient was getting up to leave to get blood drawn he had another episode of dizziness upon standing.  He is unsteady on his feet.  Talked with the daughter about our recommendations for assistive diet devices for ambulating.  States she has walker and cane but patient unwilling to use them at this time.  Advised patient to stand slowly  and wait 30 seconds prior to ambulating after standing.   ROS      Objective:    BP 111/75   Pulse 76   Ht 6\' 2"  (1.88 m)   Wt 263 lb (119.3 kg)   SpO2 95% Comment: on RA  BMI 33.77 kg/m    Physical Exam General: Alert and oriented Pulmonary: No respiratory distress Ambulation: Unsteady upon standing.  Leans on walls or other objects to help steady himself.  Slowed gait.  Results for orders placed or performed in visit on 01/15/23  POCT HgB A1C  Result Value Ref Range   Hemoglobin A1C 7.0 (A) 4.0 - 5.6 %   HbA1c POC (<> result, manual entry)     HbA1c, POC (prediabetic range)     HbA1c, POC (controlled diabetic range)          Assessment & Plan:   Problem List Items Addressed This Visit       Endocrine   Type 2 diabetes mellitus with hyperglycemia, without long-term current use of insulin (HCC) - Primary    A1c of 7.0 confirms diabetes diagnosis - Started metformin XR once daily today.  Discussed possible side effects. - Follow-up A1c in 3 months.  At that visit can do foot exam - Patient declined diabetic retinopathy screening recommendation.  Does not have optometrist or ophthalmologist. - Follow-up lipid panel.  Patient reluctant to start  new medications.  Was not on medications prior to this.      Relevant Medications   metFORMIN (GLUCOPHAGE-XR) 750 MG 24 hr tablet   Other Relevant Orders   POCT HgB A1C (Completed)   Lipid Profile     Other   Dizziness    Appears to be orthostatic presyncope.  At this point would favor cardiac etiology. - Follow-up echocardiogram, placed today. - Encouraged use of assistive devices which they have at home already.      Dyspnea on exertion    Likely cardiac.  Getting echo so that it will be available prior to his cardiology visit.  Strong possibility that he has underlying valvular disorder or some degree of heart failure.  If heart failure present we can begin management prior to his cardiology visit if needed.       Relevant Orders   ECHOCARDIOGRAM COMPLETE    Meds ordered this encounter  Medications   metFORMIN (GLUCOPHAGE-XR) 750 MG 24 hr tablet    Sig: Take 1 tablet (750 mg total) by mouth daily with breakfast.    Dispense:  90 tablet    Refill:  0    Return in about 3 months (around 04/17/2023) for DM.  Benay Pike, MD

## 2023-01-15 NOTE — Assessment & Plan Note (Signed)
Appears to be orthostatic presyncope.  At this point would favor cardiac etiology. - Follow-up echocardiogram, placed today. - Encouraged use of assistive devices which they have at home already.

## 2023-01-15 NOTE — Assessment & Plan Note (Signed)
A1c of 7.0 confirms diabetes diagnosis - Started metformin XR once daily today.  Discussed possible side effects. - Follow-up A1c in 3 months.  At that visit can do foot exam - Patient declined diabetic retinopathy screening recommendation.  Does not have optometrist or ophthalmologist. - Follow-up lipid panel.  Patient reluctant to start new medications.  Was not on medications prior to this.

## 2023-01-15 NOTE — Patient Instructions (Signed)
I have prescribed metformin for you to take once a day.    I would like you to followup with me in about 3 months after your cardiology visit.    You can take tylenol 1000mg  every 8 hours as needed for headache.    If you need to see me sooner, please make an appointment.   Have a great day,   Dr. Jeannine Kitten

## 2023-01-15 NOTE — Telephone Encounter (Signed)
        Patient  visited [place] on 3/20    Telephone encounter attempt :  2nd  A HIPAA compliant voice message was left requesting a return call.  Instructed patient to call back    [place] [phone] [address], [place], [place] [address] Phone: [phone] Email: [email or web address]

## 2023-01-15 NOTE — Assessment & Plan Note (Signed)
Likely cardiac.  Getting echo so that it will be available prior to his cardiology visit.  Strong possibility that he has underlying valvular disorder or some degree of heart failure.  If heart failure present we can begin management prior to his cardiology visit if needed.

## 2023-01-16 LAB — LIPID PANEL
Chol/HDL Ratio: 5.9 ratio — ABNORMAL HIGH (ref 0.0–5.0)
Cholesterol, Total: 171 mg/dL (ref 100–199)
HDL: 29 mg/dL — ABNORMAL LOW (ref >39)
LDL Chol Calc (NIH): 103 mg/dL — ABNORMAL HIGH (ref 0–99)
Triglycerides: 227 mg/dL — ABNORMAL HIGH (ref 0–149)
VLDL Cholesterol Cal: 39 mg/dL (ref 5–40)

## 2023-02-06 ENCOUNTER — Telehealth: Payer: Self-pay

## 2023-02-06 NOTE — Telephone Encounter (Signed)
Contacted [facility]vere[facility] Pace Lader to schedule their annual wellness visit. Appointment made for 02/11/23.  Agnes Lawrence, CMA (AAMA)  [facility] [phone]

## 2023-02-11 ENCOUNTER — Ambulatory Visit (INDEPENDENT_AMBULATORY_CARE_PROVIDER_SITE_OTHER): Payer: PPO

## 2023-02-11 VITALS — Ht 74.0 in | Wt 263.0 lb

## 2023-02-11 DIAGNOSIS — Z0000 Encounter for general adult medical examination without abnormal findings: Secondary | ICD-10-CM

## 2023-02-11 NOTE — Progress Notes (Signed)
Subjective:   Bryan Thomas is a 82 y.o. male who presents for Medicare Annual/Subsequent preventive examination.  Review of Systems    Virtual Visit via Telephone Note  I connected with  Bryan Thomas on 02/11/23 at  8:15 AM EDT by telephone and verified that I am speaking with the correct person using two identifiers.  Location: Patient: Home Provider: Office Persons participating in the virtual visit: patient/Nurse Health Advisor   I discussed the limitations, risks, security and privacy concerns of performing an evaluation and management service by telephone and the availability of in person appointments. The patient expressed understanding and agreed to proceed.  Interactive audio and video telecommunications were attempted between this nurse and patient, however failed, due to patient having technical difficulties OR patient did not have access to video capability.  We continued and completed visit with audio only.  Some vital signs may be absent or patient reported.   Tillie Rung, LPN  Cardiac Risk Factors include: advanced age (>34men, >66 women);male gender;diabetes mellitus     Objective:    Today's Vitals   02/11/23 0820 02/11/23 0821  Weight: 263 lb (119.3 kg)   Height:  (1.88 m)   PainSc:  0-No pain   Body mass index is 33.77 kg/m.     02/11/2023    8:28 AM 01/08/2023   12:53 PM 04/01/2022   11:19 AM 12/10/2021    1:25 PM 10/24/2021    4:31 PM 10/03/2021   11:54 AM 09/14/2021   11:14 AM  Advanced Directives  Does Patient Have a Medical Advance Directive? Yes No Yes No No Yes Yes  Type of Estate agent of [place];Living will  Living will   Healthcare Power of [place];Living will Healthcare Power of [place];Living will  Does patient want to make changes to medical advance directive? No - Patient declined    No - Patient declined No - Patient declined No - Patient declined  Copy of Healthcare Power of Attorney in Chart?  Yes - validated most recent copy scanned in chart (See row information)     No - copy requested No - copy requested  Would patient like information on creating a medical advance directive?    No - Patient declined No - Patient declined      Current Medications (verified) Outpatient Encounter Medications as of 02/11/2023  Medication Sig   amoxicillin (AMOXIL) 875 MG tablet Take 1 tablet (875 mg total) by mouth 2 (two) times daily. Please be sure to finish the entire prescription even once you feel better. (Patient not taking: Reported on 01/08/2023)   benzonatate (TESSALON) 100 MG capsule Take 1 capsule (100 mg total) by mouth 3 (three) times daily as needed for cough. (Patient not taking: Reported on 01/08/2023)   clobetasol cream (TEMOVATE) 0.05 % Apply 1 Application topically 2 (two) times daily.   metFORMIN (GLUCOPHAGE-XR) 750 MG 24 hr tablet Take 1 tablet (750 mg total) by mouth daily with breakfast.   methylPREDNISolone (MEDROL) 4 MG TBPK tablet Take by mouth as directed for 6 days (Patient not taking: Reported on 01/08/2023)   No facility-administered encounter medications on file as of 02/11/2023.    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   Procedure: RIGHT TOTAL KNEE ARTHROPLASTY;  Surgeon: Tarry Kos, MD;  Location: [facility] OR;  Service: Orthopedics;  Laterality: Right;   Family History  Problem Relation Age of Onset   Cancer Mother    Breast cancer Mother    Arthritis Mother    Aneurysm Father    Diabetes Sister    Diabetes Brother    Heart disease Brother        CABG   Diabetes Brother    Diabetes Brother    Social History   Socioeconomic History   Marital status: Widowed     Spouse name: Not on file   Number of children: 4   Years of education: Not on file   Highest education level: Not on file  Occupational History   Occupation: Service work--mechanic    Comment: tired  Tobacco Use   Smoking status: Former    Packs/day: 1.00    Years: 40.00    Additional pack years: 0.00    Total pack years: 40.00    Types: Cigarettes    Quit date: 1990    Years since quitting: 34.3    Passive exposure: Past   Smokeless tobacco: Former    Types: Chew    Quit date: 08/21/2021  Vaping Use   Vaping Use: Never used  Substance and Sexual Activity   Alcohol use: No    Alcohol/week: 0.0 standard drinks of alcohol   Drug use: Never   Sexual activity: Not on file  Other Topics Concern   Not on file  Social History Narrative   Widowed 1/22      Has living will   Daughter Vernona Rieger is health care POA   Would accept resuscitation attempts--but no prolonged artificial ventilation   No tube feeds if cognitively unaware   Social Determinants of Health   Financial Resource Strain: Low Risk  (02/11/2023)   Overall Financial Resource Strain (CARDIA)    Difficulty of Paying Living Expenses: Not hard at all  Food Insecurity: No Food Insecurity (02/11/2023)   Hunger Vital Sign    Worried About Running Out of Food in the Last Year: Never true    Ran Out of Food in the Last Year: Never true  Transportation Needs: No Transportation Needs (02/11/2023)   PRAPARE - Administrator, Civil Service (Medical): No    Lack of Transportation (Non-Medical): No  Physical Activity: Inactive (02/11/2023)   Exercise Vital Sign    Days of Exercise per Week: 0 days    Minutes of Exercise per Session: 0 min  Stress: No Stress Concern Present (02/11/2023)   [employer] of Occupational Health - Occupational Stress Questionnaire    Feeling of Stress : Not at all  Social Connections: Socially Integrated (02/11/2023)   Social Connection and Isolation Panel [NHANES]    Frequency of  Communication with Friends and Family: More than three times a week    Frequency of Social Gatherings with Friends and Family: More than three times a week    Attends Religious Services: More than 4 times per year    Active Member of [employer] or Organizations: Yes    Attends Engineer, structural: More than 4 times per year    Marital Status: Married    Tobacco Counseling Counseling given: Not Answered   Clinical Intake:  Pre-visit preparation completed: No  Pain : No/denies pain Pain Score: 0-No pain Faces Pain Scale: No hurt  Faces Pain Scale: No hurt  BMI - recorded: 33.77 Nutritional Status: BMI > 30  Obese Nutritional Risks: None Diabetes: Yes (Pre- Diabetic) CBG done?: No Did pt. bring in CBG monitor from home?: No  How often do you need to have someone help you when you read instructions, pamphlets, or other written materials from your doctor or pharmacy?: 1 - Never  Diabetic?  Pre-Diabetic  Interpreter Needed?: No  Information entered by :: Theresa Mulligan LPN   Activities of Daily Living    02/11/2023    8:26 AM 12/04/2022   10:39 AM  In your present state of health, do you have any difficulty performing the following activities:  Hearing? 1 1  Comment Wears hearing aids   Vision? 0 0  Difficulty concentrating or making decisions? 0 0  Walking or climbing stairs? 0 1  Dressing or bathing? 0 1  Doing errands, shopping? 0 0  Preparing Food and eating ? N   Using the Toilet? N   In the past six months, have you accidently leaked urine? N   Do you have problems with loss of bowel control? N   Managing your Medications? N   Managing your Finances? N   Housekeeping or managing your Housekeeping? N     Patient Care Team: Sandre Kitty, MD as PCP - General (Family Medicine)  Indicate any recent Medical Services you may have received from other than [facility] providers in the past year (date may be approximate).     Assessment:   This is a routine  wellness examination for [place].  Hearing/Vision screen Hearing Screening - Comments:: Wears hearing aids Vision Screening - Comments::  Not up to date with routine eye exams with  Deferred  Dietary issues and exercise activities discussed: Current Exercise Habits: The patient does not participate in regular exercise at present, Exercise limited by: None identified   Goals Addressed               This Visit's Progress     Stay Healthy (pt-stated)         Depression Screen    02/11/2023    8:25 AM 12/04/2022   10:39 AM 03/19/2022    2:59 PM 03/23/2021   12:16 PM 03/03/2018   10:57 AM 02/25/2017   11:01 AM  PHQ 2/9 Scores  PHQ - 2 Score 0 2 1 0 0 0  PHQ- 9 Score  7        Fall Risk    02/11/2023    8:27 AM 12/04/2022   10:40 AM 03/19/2022    2:59 PM 03/23/2021   12:16 PM 05/16/2020    1:08 PM  Fall Risk   Falls in the past year? 0 0 0 0 0  Comment     Emmi Telephone Survey: data to providers prior to load  Number falls in past yr: 0 0 0    Injury with Fall? 0 0 0    Risk for fall due to : No Fall Risks  No Fall Risks    Follow up Falls prevention discussed  Falls evaluation completed      FALL RISK PREVENTION PERTAINING TO THE HOME:  Any stairs in or around the home? Yes  If so, are there any without handrails? Yes  Home free of loose throw rugs in walkways, pet beds, electrical cords, etc? Yes  Adequate lighting in your home to reduce risk of falls? Yes   ASSISTIVE DEVICES UTILIZED TO PREVENT FALLS:  Life alert? No  Use of a cane,  walker or w/c? No  Grab bars in the bathroom? No  Shower chair or bench in shower? No  Elevated toilet seat or a handicapped toilet? No   TIMED UP AND GO:  Was the test performed? No . Audio Visit   Cognitive Function:        02/11/2023    8:28 AM  6CIT Screen  What Year? 0 points  What month? 0 points  What time? 0 points  Count back from 20 0 points  Months in reverse 0 points  Repeat phrase 0 points  Total Score 0  points   Immunizations Immunization History  Administered Date(s) Administered   Pneumococcal Conjugate-13 09/20/2014   Pneumococcal Polysaccharide-23 02/25/2017   Td 09/20/2014   Tdap 07/31/2021    TDAP status: Up to date  Flu Vaccine status: Up to date  Pneumococcal vaccine status: Up to date   Screening Tests Health Maintenance  Topic Date Due   FOOT EXAM  Never done   OPHTHALMOLOGY EXAM  02/11/2023 (Originally 04/19/1951)   Diabetic kidney evaluation - Urine ACR  02/12/2023 (Originally 04/19/1959)   INFLUENZA VACCINE  05/22/2023   HEMOGLOBIN A1C  07/18/2023   Diabetic kidney evaluation - eGFR measurement  01/08/2024   Medicare Annual Wellness (AWV)  02/11/2024   DTaP/Tdap/Td (3 - Td or Tdap) 08/01/2031   Pneumonia Vaccine 45+ Years old  Completed   HPV VACCINES  Aged Out   COVID-19 Vaccine  Discontinued   Zoster Vaccines- Shingrix  Discontinued    Health Maintenance  Health Maintenance Due  Topic Date Due   FOOT EXAM  Never done    Colorectal cancer screening: No longer required.   Lung Cancer Screening: (Low Dose CT Chest recommended if Age 65-80 years, 30 pack-year currently smoking OR have quit w/in 15years.) does not qualify.     Additional Screening:  Hepatitis C Screening: does not qualify; Completed   Vision Screening: Recommended annual ophthalmology exams for early detection of glaucoma and other disorders of the eye. Is the patient up to date with their annual eye exam?  No  Who is the provider or what is the name of the office in which the patient attends annual eye exams? Deferred If pt is not established with a provider, would they like to be referred to a provider to establish care? No .   Dental Screening: Recommended annual dental exams for proper oral hygiene  Community Resource Referral / Chronic Care Management:  CRR required this visit?  No   CCM required this visit?  No      Plan:     I have personally reviewed and noted the  following in the patient's chart:   Medical and social history Use of alcohol, tobacco or illicit drugs  Current medications and supplements including opioid prescriptions. Patient is not currently taking opioid prescriptions. Functional ability and status Nutritional status Physical activity Advanced directives List of other physicians Hospitalizations, surgeries, and ER visits in previous 12 months Vitals Screenings to include cognitive, depression, and falls Referrals and appointments  In addition, I have reviewed and discussed with patient certain preventive protocols, quality metrics, and best practice recommendations. A written personalized care plan for preventive services as well as general preventive health recommendations were provided to patient.     Tillie Rung, LPN   1/61/0960   Nurse Notes: patient due Diabetic kidney evaluation-Urine ACR

## 2023-02-11 NOTE — Patient Instructions (Addendum)
Mr. Bryan Thomas , Thank you for taking time to come for your Medicare Wellness Visit. I appreciate your ongoing commitment to your health goals. Please review the following plan we discussed and let me know if I can assist you in the future.   These are the goals we discussed:  Goals       Stay Healthy (pt-stated)        This is a list of the screening recommended for you and due dates:  Health Maintenance  Topic Date Due   Complete foot exam   Never done   Eye exam for diabetics  02/11/2023*   Yearly kidney health urinalysis for diabetes  02/12/2023*   Flu Shot  05/22/2023   Hemoglobin A1C  07/18/2023   Yearly kidney function blood test for diabetes  01/08/2024   Medicare Annual Wellness Visit  02/11/2024   DTaP/Tdap/Td vaccine (3 - Td or Tdap) 08/01/2031   Pneumonia Vaccine  Completed   HPV Vaccine  Aged Out   COVID-19 Vaccine  Discontinued   Zoster (Shingles) Vaccine  Discontinued  *Topic was postponed. The date shown is not the original due date.    Advanced directives: In Chart  Conditions/risks identified: None  Next appointment: Follow up in one year for your annual wellness visit.   Preventive Care 61 Years and Older, Male  Preventive care refers to lifestyle choices and visits with your health care provider that can promote health and wellness. What does preventive care include? A yearly physical exam. This is also called an annual well check. Dental exams once or twice a year. Routine eye exams. Ask your health care provider how often you should have your eyes checked. Personal lifestyle choices, including: Daily care of your teeth and gums. Regular physical activity. Eating a healthy diet. Avoiding tobacco and drug use. Limiting alcohol use. Practicing safe sex. Taking low doses of aspirin every day. Taking vitamin and mineral supplements as recommended by your health care provider. What happens during an annual well check? The services and screenings done  by your health care provider during your annual well check will depend on your age, overall health, lifestyle risk factors, and family history of disease. Counseling  Your health care provider may ask you questions about your: Alcohol use. Tobacco use. Drug use. Emotional well-being. Home and relationship well-being. Sexual activity. Eating habits. History of falls. Memory and ability to understand (cognition). Work and work Astronomer. Screening  You may have the following tests or measurements: Height, weight, and BMI. Blood pressure. Lipid and cholesterol levels. These may be checked every 5 years, or more frequently if you are over 14 years old. Skin check. Lung cancer screening. You may have this screening every year starting at age 79 if you have a 30-pack-year history of smoking and currently smoke or have quit within the past 15 years. Fecal occult blood test (FOBT) of the stool. You may have this test every year starting at age 18. Flexible sigmoidoscopy or colonoscopy. You may have a sigmoidoscopy every 5 years or a colonoscopy every 10 years starting at age 69. Prostate cancer screening. Recommendations will vary depending on your family history and other risks. Hepatitis C blood test. Hepatitis B blood test. Sexually transmitted disease (STD) testing. Diabetes screening. This is done by checking your blood sugar (glucose) after you have not eaten for a while (fasting). You may have this done every 1-3 years. Abdominal aortic aneurysm (AAA) screening. You may need this if you are a current or former  smoker. Osteoporosis. You may be screened starting at age 65 if you are at high risk. Talk with your health care provider about your test results, treatment options, and if necessary, the need for more tests. Vaccines  Your health care provider may recommend certain vaccines, such as: Influenza vaccine. This is recommended every year. Tetanus, diphtheria, and acellular  pertussis (Tdap, Td) vaccine. You may need a Td booster every 10 years. Zoster vaccine. You may need this after age 11. Pneumococcal 13-valent conjugate (PCV13) vaccine. One dose is recommended after age 20. Pneumococcal polysaccharide (PPSV23) vaccine. One dose is recommended after age 100. Talk to your health care provider about which screenings and vaccines you need and how often you need them. This information is not intended to replace advice given to you by your health care provider. Make sure you discuss any questions you have with your health care provider. Document Released: 11/03/2015 Document Revised: 06/26/2016 Document Reviewed: 08/08/2015 Elsevier Interactive Patient Education  2017 [place] Prevention in the Home Falls can cause injuries. They can happen to people of all ages. There are many things you can do to make your home safe and to help prevent falls. What can I do on the outside of my home? Regularly fix the edges of walkways and driveways and fix any cracks. Remove anything that might make you trip as you walk through a door, such as a raised step or threshold. Trim any bushes or trees on the path to your home. Use bright outdoor lighting. Clear any walking paths of anything that might make someone trip, such as rocks or tools. Regularly check to see if handrails are loose or broken. Make sure that both sides of any steps have handrails. Any raised decks and porches should have guardrails on the edges. Have any leaves, snow, or ice cleared regularly. Use sand or salt on walking paths during winter. Clean up any spills in your garage right away. This includes oil or grease spills. What can I do in the bathroom? Use night lights. Install grab bars by the toilet and in the tub and shower. Do not use towel bars as grab bars. Use non-skid mats or decals in the tub or shower. If you need to sit down in the shower, use a plastic, non-slip stool. Keep the floor  dry. Clean up any water that spills on the floor as soon as it happens. Remove soap buildup in the tub or shower regularly. Attach bath mats securely with double-sided non-slip rug tape. Do not have throw rugs and other things on the floor that can make you trip. What can I do in the bedroom? Use night lights. Make sure that you have a light by your bed that is easy to reach. Do not use any sheets or blankets that are too big for your bed. They should not hang down onto the floor. Have a firm chair that has side arms. You can use this for support while you get dressed. Do not have throw rugs and other things on the floor that can make you trip. What can I do in the kitchen? Clean up any spills right away. Avoid walking on wet floors. Keep items that you use a lot in easy-to-reach places. If you need to reach something above you, use a strong step stool that has a grab bar. Keep electrical cords out of the way. Do not use floor polish or wax that makes floors slippery. If you must use wax, use non-skid  floor wax. Do not have throw rugs and other things on the floor that can make you trip. What can I do with my stairs? Do not leave any items on the stairs. Make sure that there are handrails on both sides of the stairs and use them. Fix handrails that are broken or loose. Make sure that handrails are as long as the stairways. Check any carpeting to make sure that it is firmly attached to the stairs. Fix any carpet that is loose or worn. Avoid having throw rugs at the top or bottom of the stairs. If you do have throw rugs, attach them to the floor with carpet tape. Make sure that you have a light switch at the top of the stairs and the bottom of the stairs. If you do not have them, ask someone to add them for you. What else can I do to help prevent falls? Wear shoes that: Do not have high heels. Have rubber bottoms. Are comfortable and fit you well. Are closed at the toe. Do not wear  sandals. If you use a stepladder: Make sure that it is fully opened. Do not climb a closed stepladder. Make sure that both sides of the stepladder are locked into place. Ask someone to hold it for you, if possible. Clearly mark and make sure that you can see: Any grab bars or handrails. First and last steps. Where the edge of each step is. Use tools that help you move around (mobility aids) if they are needed. These include: Canes. Walkers. Scooters. Crutches. Turn on the lights when you go into a dark area. Replace any light bulbs as soon as they burn out. Set up your furniture so you have a clear path. Avoid moving your furniture around. If any of your floors are uneven, fix them. If there are any pets around you, be aware of where they are. Review your medicines with your doctor. Some medicines can make you feel dizzy. This can increase your chance of falling. Ask your doctor what other things that you can do to help prevent falls. This information is not intended to replace advice given to you by your health care provider. Make sure you discuss any questions you have with your health care provider. Document Released: 08/03/2009 Document Revised: 03/14/2016 Document Reviewed: 11/11/2014 Elsevier Interactive Patient Education  2017 [employer].

## 2023-02-28 ENCOUNTER — Telehealth: Payer: Self-pay | Admitting: Cardiology

## 2023-02-28 NOTE — Telephone Encounter (Signed)
Daughter returned call and stated patient had ED visit and they referred him to cardiology.,

## 2023-02-28 NOTE — Telephone Encounter (Signed)
Spoke patients daughter per release form. Confirmed he has appointment and she had no further questions at this time.

## 2023-03-06 ENCOUNTER — Ambulatory Visit: Payer: PPO | Attending: Cardiology | Admitting: Cardiology

## 2023-03-06 ENCOUNTER — Encounter: Payer: Self-pay | Admitting: Cardiology

## 2023-03-06 VITALS — BP 132/80 | HR 62 | Ht 74.0 in | Wt 261.4 lb

## 2023-03-06 DIAGNOSIS — R072 Precordial pain: Secondary | ICD-10-CM | POA: Diagnosis not present

## 2023-03-06 DIAGNOSIS — R42 Dizziness and giddiness: Secondary | ICD-10-CM | POA: Diagnosis not present

## 2023-03-06 DIAGNOSIS — R079 Chest pain, unspecified: Secondary | ICD-10-CM

## 2023-03-06 MED ORDER — METOPROLOL TARTRATE 100 MG PO TABS: 100.0000 mg | ORAL_TABLET | Freq: Once | ORAL | 0 refills | Status: DC

## 2023-03-06 NOTE — Patient Instructions (Signed)
Medication Instructions:   Your physician recommends that you continue on your current medications as directed. Please refer to the Current Medication list given to you today.  *If you need a refill on your cardiac medications before your next appointment, please call your pharmacy*   Lab Work:  None Ordered  If you have labs (blood work) drawn today and your tests are completely normal, you will receive your results only by: MyChart Message (if you have MyChart) OR A paper copy in the mail If you have any lab test that is abnormal or we need to change your treatment, we will call you to review the results.   Testing/Procedures:  Your physician has requested that you have an echocardiogram. Echocardiography is a painless test that uses sound waves to create images of your heart. It provides your doctor with information about the size and shape of your heart and how well your heart's chambers and valves are working. This procedure takes approximately one hour. There are no restrictions for this procedure. Please do NOT wear cologne, perfume, aftershave, or lotions (deodorant is allowed). Please arrive 15 minutes prior to your appointment time.    Your cardiac CT will be scheduled at:  [facility] [address] [place], [place] [address] [phone]  OR   [facility] [address] [place], [place] [address] [phone]  If scheduled at [facility] or [facility], please arrive 15 mins early for check-in and test prep.   Please follow these instructions carefully (unless otherwise directed):  Hold all erectile dysfunction medications at least 3 days (72 hrs) prior to test. (Ie viagra, cialis, sildenafil, tadalafil, etc) We will administer nitroglycerin during this exam.   On the Night Before the Test: Be sure to Drink plenty of water. Do not consume any  caffeinated/decaffeinated beverages or chocolate 12 hours prior to your test. Do not take any antihistamines 12 hours prior to your test.   On the Day of the Test: Drink plenty of water until 1 hour prior to the test. Do not eat any food 1 hour prior to test. You may take your regular medications prior to the test.  Take metoprolol (Lopressor) two hours prior to test.  After the Test: Drink plenty of water. After receiving IV contrast, you may experience a mild flushed feeling. This is normal. On occasion, you may experience a mild rash up to 24 hours after the test. This is not dangerous. If this occurs, you can take Benadryl 25 mg and increase your fluid intake. If you experience trouble breathing, this can be serious. If it is severe call 911 IMMEDIATELY. If it is mild, please call our office. If you take any of these medications: Glipizide/Metformin, Avandament, Glucavance, please do not take 48 hours after completing test unless otherwise instructed.  We will call to schedule your test 2-4 weeks out understanding that some insurance companies will need an authorization prior to the service being performed.   For non-scheduling related questions, please contact the cardiac imaging nurse navigator should you have any questions/concerns: Rockwell Alexandria, Cardiac Imaging Nurse Navigator [name], Cardiac Imaging Nurse Navigator  [facility] Direct Office Dial: [phone]   For scheduling needs, including cancellations and rescheduling, please call [place], [phone].    Follow-Up: At [facility], you and your health needs are our priority.  As part of our continuing mission to provide you with exceptional heart care,  we have created designated Provider Care Teams.  These Care Teams include your primary Cardiologist (physician) and Advanced Practice Providers (APPs -  Physician Assistants and Nurse Practitioners) who all work together to  provide you with the care you need, when you need it.  We recommend signing up for the patient portal called "MyChart".  Sign up information is provided on this After Visit Summary.  MyChart is used to connect with patients for Virtual Visits (Telemedicine).  Patients are able to view lab/test results, encounter notes, upcoming appointments, etc.  Non-urgent messages can be sent to your provider as well.   To learn more about what you can do with MyChart, go to [email or web address].    Your next appointment:    6-8 weeks  Provider:   You may see Debbe Odea, MD or one of the following Advanced Practice Providers on your designated Care Team:   Nicolasa Ducking, NP Eula Listen, PA-C Cadence Fransico Michael, PA-C Charlsie Quest, NP

## 2023-03-06 NOTE — Progress Notes (Signed)
Cardiology Office Note:    Date:  03/06/2023   ID:  Bryan Thomas, DOB [birth year], MRN [id number]  PCP:  Sandre Kitty, MD   [facility] Providers Cardiologist:  Debbe Odea, MD     Referring MD: Pilar Jarvis, MD   Chief Complaint  Patient presents with   New Patient (Initial Visit)    Patient reports occasional dull pain on right side usually with lying down.  Last episode was last night.  Went to ER couple months ago and was told there changes in EKG.   Bryan Thomas is a 82 y.o. male who is being seen today for the evaluation of chest pain at the request of Pilar Jarvis, MD.   History of Present Illness:    Bryan Thomas is a 82 y.o. male with a hx of diabetes, former smoker x 30+ years who presents due to chest pain.  Has occasional left-sided chest pain not associated with exertion.  Presented to the ED 2 months ago due to chest pain symptoms.  Workup with EKG and troponins were unrevealing.  Symptoms have persisted since.  States having dizziness usually when getting up from a seated position.  Denies palpitations, shortness of breath.  Endorses a wobbly gait, assist device recommended by PCP.  Currently not using.  States doing okay otherwise.  Recently started on metformin for elevated blood sugar, A1c 7.0, patient does not think he has diabetes.  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   metFORMIN (GLUCOPHAGE-XR) 750 MG 24 hr tablet Take 1 tablet (750 mg total) by mouth daily with breakfast.   metoprolol tartrate (LOPRESSOR) 100 MG tablet Take 1 tablet (100 mg total) by mouth once for 1 dose. TWO HOURS PRIOR TO CARDIAC CTA     Allergies:   Oxycodone   Social History   Socioeconomic History   Marital status: Widowed    Spouse name: Not on file   Number of children: 4   Years of education: Not on file   Highest education level: Not on file  Occupational History   Occupation: Service work--mechanic    Comment: tired  Tobacco Use   Smoking status: Former    Packs/day: 1.00    Years: 40.00    Additional pack years: 0.00    Total pack years: 40.00    Types: Cigarettes    Quit date: 1990    Years since quitting: 34.3    Passive exposure: Past   Smokeless tobacco: Former    Types: Chew    Quit date: 08/21/2021  Vaping Use   Vaping Use: Never used  Substance and Sexual Activity   Alcohol use: No    Alcohol/week: 0.0 standard drinks of alcohol   Drug use: Never   Sexual activity: Not on file  Other Topics Concern   Not on file  Social History Narrative   Widowed 1/22  Has living will   Daughter Vernona Rieger is health care POA   Would accept resuscitation attempts--but no prolonged artificial ventilation   No tube feeds if cognitively unaware   Social Determinants of Health   Financial Resource Strain: Low Risk  (02/11/2023)   Overall Financial Resource Strain (CARDIA)    Difficulty of Paying Living Expenses: Not hard at all  Food Insecurity: No Food Insecurity (02/11/2023)   Hunger Vital Sign    Worried About Running Out of Food in the Last Year: Never true    Ran Out of Food in the Last Year: Never true  Transportation Needs: No Transportation Needs (02/11/2023)   PRAPARE - Administrator, Civil Service (Medical): No    Lack of Transportation (Non-Medical): No  Physical Activity: Inactive (02/11/2023)   Exercise Vital Sign    Days of  Exercise per Week: 0 days    Minutes of Exercise per Session: 0 min  Stress: No Stress Concern Present (02/11/2023)   [employer] of Occupational Health - Occupational Stress Questionnaire    Feeling of Stress : Not at all  Social Connections: Socially Integrated (02/11/2023)   Social Connection and Isolation Panel [NHANES]    Frequency of Communication with Friends and Family: More than three times a week    Frequency of Social Gatherings with Friends and Family: More than three times a week    Attends Religious Services: More than 4 times per year    Active Member of [employer] or Organizations: Yes    Attends Engineer, structural: More than 4 times per year    Marital Status: Married     Family History: The patient's family history includes Aneurysm in his father; Arthritis in his mother; Breast cancer in his mother; Cancer in his mother; Diabetes in his brother, brother, brother, and sister; Heart disease in his brother and brother.  ROS:   Please see the history of present illness.     All other systems reviewed and are negative.  EKGs/Labs/Other Studies Reviewed:    The following studies were reviewed today:   EKG:  EKG is  ordered today.  The ekg ordered today demonstrates sinus rhythm, first-degree AV block, heart rate 62  Recent Labs: 03/28/2022: ALT 20 01/08/2023: BUN 18; Creatinine, Ser 0.92; Hemoglobin 16.1; Platelets 297; Potassium 4.1; Sodium 135  Recent Lipid Panel    Component Value Date/Time   CHOL 171 01/15/2023 0924   TRIG 227 (H) 01/15/2023 0924   HDL 29 (L) 01/15/2023 0924   CHOLHDL 5.9 (H) 01/15/2023 0924   LDLCALC 103 (H) 01/15/2023 0924     Risk Assessment/Calculations:             Physical Exam:    VS:  BP 132/80 (BP Location: Right Arm, Patient Position: Sitting, Cuff Size: Normal)   Pulse 62   Ht 6\' 2"  (1.88 m)   Wt 261 lb 6.4 oz (118.6 kg)   SpO2 95%   BMI 33.56 kg/m     Wt Readings from Last 3 Encounters:  03/06/23 261 lb  6.4 oz (118.6 kg)  02/11/23 263 lb (119.3 kg)  01/15/23 263 lb (119.3 kg)     GEN:  Well nourished, well developed in no acute distress HEENT: Normal NECK: No JVD; No carotid bruits CARDIAC: RRR, no murmurs, rubs, gallops RESPIRATORY:  Clear to auscultation without rales, wheezing or rhonchi  ABDOMEN: Soft, non-tender, non-distended MUSCULOSKELETAL:  No edema; No deformity  SKIN: Warm and dry NEUROLOGIC:  Alert and oriented  x 3 PSYCHIATRIC:  Normal affect   ASSESSMENT:    1. Precordial pain   2. Dizziness   3. Chest pain, unspecified type    PLAN:    In order of problems listed above:  Chest pain, risk factors diabetes, former smoker.  Get echo, get coronary CTA. Dizziness, usually with changing positions, denies palpitations.  Advised to stand up slowly when moving from sitting to standing position.  Assist devices such as cane and walker recommended to prevent falls.  Follow-up after cardiac testing.     Medication Adjustments/Labs and Tests Ordered: Current medicines are reviewed at length with the patient today.  Concerns regarding medicines are outlined above.  Orders Placed This Encounter  Procedures   CT CORONARY MORPH W/CTA COR W/SCORE W/CA W/CM &/OR WO/CM   EKG 12-Lead   Meds ordered this encounter  Medications   metoprolol tartrate (LOPRESSOR) 100 MG tablet    Sig: Take 1 tablet (100 mg total) by mouth once for 1 dose. TWO HOURS PRIOR TO CARDIAC CTA    Dispense:  1 tablet    Refill:  0    Patient Instructions  Medication Instructions:   Your physician recommends that you continue on your current medications as directed. Please refer to the Current Medication list given to you today.  *If you need a refill on your cardiac medications before your next appointment, please call your pharmacy*   Lab Work:  None Ordered  If you have labs (blood work) drawn today and your tests are completely normal, you will receive your results only by: MyChart Message  (if you have MyChart) OR A paper copy in the mail If you have any lab test that is abnormal or we need to change your treatment, we will call you to review the results.   Testing/Procedures:  Your physician has requested that you have an echocardiogram. Echocardiography is a painless test that uses sound waves to create images of your heart. It provides your doctor with information about the size and shape of your heart and how well your heart's chambers and valves are working. This procedure takes approximately one hour. There are no restrictions for this procedure. Please do NOT wear cologne, perfume, aftershave, or lotions (deodorant is allowed). Please arrive 15 minutes prior to your appointment time.    Your cardiac CT will be scheduled at:  [facility] [address] [place], [place] [address] [phone]  OR   [facility] [address] [place], [place] [address] [phone]  If scheduled at [facility] or [facility], please arrive 15 mins early for check-in and test prep.   Please follow these instructions carefully (unless otherwise directed):  Hold all erectile dysfunction medications at least 3 days (72 hrs) prior to test. (Ie viagra, cialis, sildenafil, tadalafil, etc) We will administer nitroglycerin during this exam.   On the Night Before the Test: Be sure to Drink plenty of water. Do not consume any caffeinated/decaffeinated beverages or chocolate 12 hours prior to your test. Do not take any antihistamines 12 hours prior to your test.   On the Day of the Test: Drink plenty of water until 1 hour prior to the test. Do not eat any food 1 hour prior to test. You may take your regular medications prior to the test.  Take metoprolol (Lopressor) two hours prior to test.  After the Test: Drink plenty of water. After receiving IV contrast, you  may experience a mild flushed feeling. This is normal. On occasion, you may experience a mild rash up to 24 hours after the test. This is not dangerous. If this occurs, you can take Benadryl 25 mg and increase your fluid intake. If you experience trouble breathing, this can be serious. If it is severe call 911 IMMEDIATELY. If it is mild, please call our office. If you take any of these medications: Glipizide/Metformin, Avandament, Glucavance, please do not take 48 hours after completing test unless otherwise instructed.  We will call to schedule your test 2-4 weeks out understanding that some insurance companies will need an authorization prior to the service being performed.   For non-scheduling related questions, please contact the cardiac imaging nurse navigator should you have any questions/concerns: Rockwell Alexandria, Cardiac Imaging Nurse Navigator [name], Cardiac Imaging Nurse Navigator [place] Direct Office Dial: [phone]   For scheduling needs, including cancellations and rescheduling, please call [place], [phone].    Follow-Up: At [facility], you and your health needs are our priority.  As part of our continuing mission to provide you with exceptional heart care, we have created designated Provider Care Teams.  These Care Teams include your primary Cardiologist (physician) and Advanced Practice Providers (APPs -  Physician Assistants and Nurse Practitioners) who all work together to provide you with the care you need, when you need it.  We recommend signing up for the patient portal called "MyChart".  Sign up information is provided on this After Visit Summary.  MyChart is used to connect with patients for Virtual Visits (Telemedicine).  Patients are able to view lab/test results, encounter notes, upcoming appointments, etc.  Non-urgent messages can be sent to your provider as well.   To learn more about what you can do with  MyChart, go to [email or web address].    Your next appointment:    6-8 weeks  Provider:   You may see Debbe Odea, MD or one of the following Advanced Practice Providers on your designated Care Team:   Nicolasa Ducking, NP Eula Listen, PA-C Cadence Fransico Michael, PA-C Charlsie Quest, NP   Signed, Debbe Odea, MD  03/06/2023 9:54 AM    [facility]

## 2023-04-07 ENCOUNTER — Ambulatory Visit (INDEPENDENT_AMBULATORY_CARE_PROVIDER_SITE_OTHER): Payer: PPO | Admitting: Family Medicine

## 2023-04-07 ENCOUNTER — Encounter: Payer: Self-pay | Admitting: Family Medicine

## 2023-04-07 VITALS — BP 157/84 | HR 60 | Temp 97.7°F | Ht 74.0 in | Wt 263.0 lb

## 2023-04-07 DIAGNOSIS — G4489 Other headache syndrome: Secondary | ICD-10-CM

## 2023-04-07 DIAGNOSIS — E1165 Type 2 diabetes mellitus with hyperglycemia: Secondary | ICD-10-CM | POA: Diagnosis not present

## 2023-04-07 DIAGNOSIS — R0609 Other forms of dyspnea: Secondary | ICD-10-CM | POA: Diagnosis not present

## 2023-04-07 LAB — POCT GLYCOSYLATED HEMOGLOBIN (HGB A1C): Hemoglobin A1C: 6.7 % — AB (ref 4.0–5.6)

## 2023-04-07 MED ORDER — METFORMIN HCL ER 750 MG PO TB24: 750.0000 mg | ORAL_TABLET | Freq: Every day | ORAL | 3 refills | Status: DC

## 2023-04-07 NOTE — Assessment & Plan Note (Signed)
A1c 6.7 today.  Has some decreased sensation in the toes b/l. Otherwise normal foot exam.  Advised continued use of metformin.  F/u 3 months.

## 2023-04-07 NOTE — Patient Instructions (Signed)
It was nice to see you today,  I will send in a prescription for the metformin with refills to last for a year.  Please make sure you follow through with your CTA testing and echocardiogram.  Please follow-up with me in 3 months from now.  Your A1c was improved at 6.7.  Please continue take the medications and try to limit your carbohydrate/sugar intake.  Have a great day,  Frederic Jericho, MD

## 2023-04-07 NOTE — Assessment & Plan Note (Signed)
Improved from previous visit.  He has seen cardiology.  Has echo and CT chest scheduled for next month.  Advised to keep these appointments.

## 2023-04-07 NOTE — Assessment & Plan Note (Signed)
Continues to have recurrent dull headaches.  Does not take medications for them.  CT head normal.  Does not appear consistent with migraines. Has not had a hx of migraines.  Advised pt to use tylenol as needed but pt resistant to taking medications in general.

## 2023-04-07 NOTE — Progress Notes (Signed)
Established Patient Office Visit  Subjective   Patient ID: Bryan Thomas, male    DOB: 09-13-1941  Age: 82 y.o. MRN: [id number]  Chief Complaint  Patient presents with   Follow-up    HPI  DM2 - Patient has been taking his metformin.  Has not taken it yet today, but will later.  We discussed his A1c, the improvement, and advised him to continue taking his medication.  Patient has some decreased sensation in his toes but "it is not bothering him".  We discussed possible medications for this but patient does not want to take any new medications today.  We discussed controlling his diabetes as a way to prevent this from worsening.  Patient has a CTA and echocardiogram scheduled for approximately 1 month from now.  Encouraged patient to follow through with this appointment.  Discussed pros and cons of these procedures, the importance with evaluating his heart health.  Discussed the metformin he was prescribed to take prior to the CTA.  Patient still complains of recurrent headache.  Describes it as a dull ache.  Does not take any medication for it.  No history of migraines.  We discussed his CT of his head from 3 months ago.  Advised patient he can take Tylenol if headaches become too bothersome for him.     ROS    Objective:     BP (!) 157/84   Pulse 60   Temp 97.7 F (36.5 C) (Oral)   Ht 6\' 2"  (1.88 m)   Wt 263 lb (119.3 kg)   SpO2 96%   BMI 33.77 kg/m    Physical Exam General: Alert, oriented.  Accompanied by daughter. Pulmonary: No respiratory distres   Results for orders placed or performed in visit on 04/07/23  POCT HgB A1C  Result Value Ref Range   Hemoglobin A1C 6.7 (A) 4.0 - 5.6 %   HbA1c POC (<> result, manual entry)     HbA1c, POC (prediabetic range)     HbA1c, POC (controlled diabetic range)        The ASCVD Risk score (Arnett DK, et al., 2019) failed to calculate for the following reasons:   The 2019 ASCVD risk score is only valid for ages 6 to  23    Assessment & Plan:   Problem List Items Addressed This Visit       Endocrine   Type 2 diabetes mellitus with hyperglycemia, without long-term current use of insulin (HCC) - Primary    A1c 6.7 today.  Has some decreased sensation in the toes b/l. Otherwise normal foot exam.  Advised continued use of metformin.  F/u 3 months.       Relevant Medications   metFORMIN (GLUCOPHAGE-XR) 750 MG 24 hr tablet   Other Relevant Orders   POCT HgB A1C (Completed)     Other   Dyspnea on exertion    Improved from previous visit.  He has seen cardiology.  Has echo and CT chest scheduled for next month.  Advised to keep these appointments.       Other headache syndrome    Continues to have recurrent dull headaches.  Does not take medications for them.  CT head normal.  Does not appear consistent with migraines. Has not had a hx of migraines.  Advised pt to use tylenol as needed but pt resistant to taking medications in general.        Return in about 3 months (around 07/08/2023) for DM.    Mabeline Caras  Constance Goltz, MD

## 2023-04-15 ENCOUNTER — Telehealth: Payer: Self-pay | Admitting: Orthopaedic Surgery

## 2023-04-16 ENCOUNTER — Telehealth (HOSPITAL_COMMUNITY): Payer: Self-pay | Admitting: *Deleted

## 2023-04-16 DIAGNOSIS — R001 Bradycardia, unspecified: Secondary | ICD-10-CM | POA: Diagnosis not present

## 2023-04-16 DIAGNOSIS — R42 Dizziness and giddiness: Secondary | ICD-10-CM | POA: Diagnosis not present

## 2023-04-16 DIAGNOSIS — R29898 Other symptoms and signs involving the musculoskeletal system: Secondary | ICD-10-CM | POA: Diagnosis not present

## 2023-04-16 NOTE — Telephone Encounter (Signed)
Patient returning call about his upcoming cardiac imaging study; pt verbalizes understanding of appt date/time, parking situation and where to check in, pre-test NPO status and medications ordered, and verified current allergies; name and call back number provided for further questions should they arise  Bryan Brick RN Navigator Cardiac Imaging [facility] [phone] office [phone] cell  Patient to take 50mg  metoprolol tartrate if his HR is greater than 65bpm two hours prior to his cardiac CT scan.  He states his HR is in the 50's while on the phone.

## 2023-04-16 NOTE — Telephone Encounter (Signed)
Attempted to call patient regarding upcoming cardiac CT appointment. °Left message on voicemail with name and callback number ° °Bryanda Mikel RN Navigator Cardiac Imaging °[place] °[phone] Office °[phone] Cell ° °

## 2023-04-17 ENCOUNTER — Emergency Department: Payer: PPO

## 2023-04-17 ENCOUNTER — Ambulatory Visit (HOSPITAL_BASED_OUTPATIENT_CLINIC_OR_DEPARTMENT_OTHER)
Admission: RE | Admit: 2023-04-17 | Discharge: 2023-04-17 | Disposition: A | Discharge status: Home / Self Care | Payer: PPO | Source: Ambulatory Visit | Attending: Cardiology | Admitting: Cardiology

## 2023-04-17 ENCOUNTER — Emergency Department
Admission: EM | Admit: 2023-04-17 | Discharge: 2023-04-17 | Disposition: A | Discharge status: Home / Self Care | Payer: PPO | Attending: Emergency Medicine | Admitting: Emergency Medicine

## 2023-04-17 ENCOUNTER — Other Ambulatory Visit: Payer: Self-pay

## 2023-04-17 DIAGNOSIS — R001 Bradycardia, unspecified: Secondary | ICD-10-CM | POA: Insufficient documentation

## 2023-04-17 DIAGNOSIS — R42 Dizziness and giddiness: Secondary | ICD-10-CM | POA: Diagnosis not present

## 2023-04-17 DIAGNOSIS — I6782 Cerebral ischemia: Secondary | ICD-10-CM | POA: Diagnosis not present

## 2023-04-17 DIAGNOSIS — R531 Weakness: Secondary | ICD-10-CM | POA: Diagnosis not present

## 2023-04-17 DIAGNOSIS — R55 Syncope and collapse: Secondary | ICD-10-CM | POA: Diagnosis not present

## 2023-04-17 DIAGNOSIS — I2510 Atherosclerotic heart disease of native coronary artery without angina pectoris: Secondary | ICD-10-CM | POA: Diagnosis not present

## 2023-04-17 DIAGNOSIS — R079 Chest pain, unspecified: Secondary | ICD-10-CM

## 2023-04-17 DIAGNOSIS — E119 Type 2 diabetes mellitus without complications: Secondary | ICD-10-CM | POA: Insufficient documentation

## 2023-04-17 DIAGNOSIS — R918 Other nonspecific abnormal finding of lung field: Secondary | ICD-10-CM | POA: Diagnosis not present

## 2023-04-17 LAB — CBC
HCT: 43.7 % (ref 39.0–52.0)
Hemoglobin: 14.2 g/dL (ref 13.0–17.0)
MCH: 31.4 pg (ref 26.0–34.0)
MCHC: 32.5 g/dL (ref 30.0–36.0)
MCV: 96.7 fL (ref 80.0–100.0)
Platelets: 255 10*3/uL (ref 150–400)
RBC: 4.52 MIL/uL (ref 4.22–5.81)
RDW: 13.2 % (ref 11.5–15.5)
WBC: 11.8 10*3/uL — ABNORMAL HIGH (ref 4.0–10.5)
nRBC: 0 % (ref 0.0–0.2)

## 2023-04-17 LAB — URINALYSIS, W/ REFLEX TO CULTURE (INFECTION SUSPECTED)
Bacteria, UA: NONE SEEN
Bilirubin Urine: NEGATIVE
Glucose, UA: NEGATIVE mg/dL
Hgb urine dipstick: NEGATIVE
Ketones, ur: NEGATIVE mg/dL
Leukocytes,Ua: NEGATIVE
Nitrite: NEGATIVE
Protein, ur: NEGATIVE mg/dL
Specific Gravity, Urine: 1.034 — ABNORMAL HIGH (ref 1.005–1.030)
Squamous Epithelial / HPF: NONE SEEN /HPF (ref 0–5)
pH: 5 (ref 5.0–8.0)

## 2023-04-17 LAB — BASIC METABOLIC PANEL
Anion gap: 7 (ref 5–15)
BUN: 15 mg/dL (ref 8–23)
CO2: 23 mmol/L (ref 22–32)
Calcium: 8.9 mg/dL (ref 8.9–10.3)
Chloride: 105 mmol/L (ref 98–111)
Creatinine, Ser: 0.77 mg/dL (ref 0.61–1.24)
GFR, Estimated: >60 mL/min (ref >60)
Glucose, Bld: 118 mg/dL — ABNORMAL HIGH (ref 70–99)
Potassium: 4.3 mmol/L (ref 3.5–5.1)
Sodium: 135 mmol/L (ref 135–145)

## 2023-04-17 LAB — HEPATIC FUNCTION PANEL
ALT: 20 U/L (ref 0–44)
AST: 20 U/L (ref 15–41)
Albumin: 3.5 g/dL (ref 3.5–5.0)
Alkaline Phosphatase: 87 U/L (ref 38–126)
Bilirubin, Direct: 0.1 mg/dL (ref 0.0–0.2)
Indirect Bilirubin: 0.6 mg/dL (ref 0.3–0.9)
Total Bilirubin: 0.7 mg/dL (ref 0.3–1.2)
Total Protein: 6.4 g/dL — ABNORMAL LOW (ref 6.5–8.1)

## 2023-04-17 LAB — POCT I-STAT CREATININE: Creatinine, Ser: 0.9 mg/dL (ref 0.61–1.24)

## 2023-04-17 LAB — TSH: TSH: 1.304 u[IU]/mL (ref 0.350–4.500)

## 2023-04-17 LAB — TROPONIN I (HIGH SENSITIVITY)
Troponin I (High Sensitivity): 7 ng/L (ref <18)
Troponin I (High Sensitivity): 7 ng/L

## 2023-04-17 LAB — MAGNESIUM: Magnesium: 2.1 mg/dL (ref 1.7–2.4)

## 2023-04-17 MED ORDER — IOHEXOL 350 MG/ML SOLN
100.0000 mL | Freq: Once | INTRAVENOUS | Status: AC | PRN
  Administered 2023-04-17: 100 mL via INTRAVENOUS

## 2023-04-17 MED ORDER — SODIUM CHLORIDE 0.9 % IV BOLUS
1000.0000 mL | Freq: Once | INTRAVENOUS | Status: AC
  Administered 2023-04-17: 1000 mL via INTRAVENOUS

## 2023-04-17 MED ORDER — NITROGLYCERIN 0.4 MG SL SUBL
0.8000 mg | SUBLINGUAL_TABLET | Freq: Once | SUBLINGUAL | Status: AC
  Administered 2023-04-17: 0.8 mg via SUBLINGUAL

## 2023-04-17 MED ORDER — METOPROLOL TARTRATE 5 MG/5ML IV SOLN
5.0000 mg | Freq: Once | INTRAVENOUS | Status: AC
  Administered 2023-04-17: 5 mg via INTRAVENOUS

## 2023-04-17 NOTE — ED Provider Notes (Signed)
Procedures     ----------------------------------------- 10:00 PM on 04/17/2023 -----------------------------------------   Heart rate 65.  Blood pressure normal.  Patient states he is back to his recent baseline.  Further testing in the ED is unremarkable, MRI brain negative.  Reviewed results of his gated coronary CTA, mild CAD, no obstructing lesions.  Stable for discharge, encouraged him to keep his upcoming appointment with cardiology and contact neurology as well.   Sharman Cheek, MD 04/17/23 2201

## 2023-04-17 NOTE — Progress Notes (Signed)
Patient arrived for Cardiac CT with pre medications instructions of Metoprolol Tartrate 50mg  PO if heart rate is greater than 65 bpm. Patient did not take medication because it was below 65 bpm. History of dizziness and headache that never goes away. Patient completed cardiac CT. After patient stated he has dizziness and headache. Patient stood up from chair stated "I'll be all right" Patient was unsteady and stated his vision is fuzzy. Sat patient down symptoms decreased and vision returned to normal. Two daughters at bedside stated he was evaluated for his continued dizziness and headaches in March. Notified his cardiologist and reader for the Cardiac CT Dr Azucena Cecil. Concerned for safety patient and family agreed to be transported by EMS and to be evaluated in the Emergency Department. Patient alert answering and following commands entire time and at time of transport.

## 2023-04-17 NOTE — ED Triage Notes (Signed)
Nurse from outpatient CT reached out to this writer, concerned for stroke for this pt. Pt c/o blurred vision when trying to stand at outpatient as well as weakness.

## 2023-04-17 NOTE — ED Triage Notes (Addendum)
Pt here with heart issues and dizziness. Pt was at outpatient CT for a cardiac scan and received 0.8mg  of nitroglycerin and 5 mg of metoprolol. After his scan pt began stumbling and not able to walk normally. Pt states he feels weak.   115-cbg 130/84 95% RA

## 2023-04-17 NOTE — ED Notes (Signed)
This RN was called into patients room by patient's family due to "patient feeling worse". This RN assessed patient and found patient sitting on side of bed using the urinal. Patient was SOB with normal O2 sats. Patient said his SOB was due to pain when urinating. MD made aware of discomfort when urinating.

## 2023-04-17 NOTE — ED Notes (Signed)
Bladder scan at bedside, result Dr. Rosalia Hammers notified.

## 2023-04-17 NOTE — ED Notes (Signed)
This RN to bedside to answer call light, pt. States he just urinated, and it burned when he was going. Pt. States he feels like his bladder is still full, and he is unable to empty it. Pt. Asked for fluids to drink as he is supposed to be drinking a lot to flush out contrast from CT this AM. This RN reported all info to Dr. Rosalia Hammers, see orders. Will obtain bladder scan. Pt. Provided ice water and cups. Denies any further need currently.

## 2023-04-17 NOTE — ED Notes (Signed)
Pt A&O x4, no obvious distress noted, respirations regular/unlabored. Pt verbalizes understanding of discharge instructions. Pt wheeled out of ED without incident   

## 2023-04-17 NOTE — ED Provider Notes (Signed)
[facility] Provider Note    Event Date/Time   First MD Initiated Contact with Patient 04/17/23 1141     (approximate)   History   Weakness   HPI  Bryan Thomas is a 82 y.o. male with history of T2DM, recurrent headaches presenting to the emergency department for evaluation of dizziness.  Patient was sent for a cardiac CT earlier today in the setting of several months of dizziness.  His goal heart rate for the procedure was under 65 and he was slightly above that around 67.  He was given 5 mg of metoprolol and two 0.4 mg sublingual nitroglycerin.  Following this, patient began to feel generally weak with associated dizziness and difficulty walking.  In the setting of this, he was directed to the ER.  Patient denies chest pain, shortness of breath.  Does report some urinary discomfort.    Physical Exam   Triage Vital Signs: ED Triage Vitals  Enc Vitals Group     BP 04/17/23 1105 139/74     Pulse Rate 04/17/23 1105 (!) 43     Resp 04/17/23 1105 16     Temp 04/17/23 1105 97.7 F (36.5 C)     Temp src --      SpO2 04/17/23 1105 95 %     Weight 04/17/23 1107 263 lb 0.1 oz (119.3 kg)     Height 04/17/23 1107 6\' 2"  (1.88 m)     Head Circumference --      Peak Flow --      Pain Score 04/17/23 1106 0     Pain Loc --      Pain Edu? --      Excl. in GC? --     Most recent vital signs: Vitals:   04/17/23 1430 04/17/23 1500  BP: (!) 166/96 (!) 166/98  Pulse: (!) 53 (!) 50  Resp: 20 15  Temp:    SpO2: 97% 95%     General: Awake, interactive  CV:  Bradycardic with regular rhythm, normal peripheral perfusion Resp:  Lungs clear, unlabored respirations.  Abd:  Soft, nondistended.  Neuro:  Symmetric facial movement, fluid speech, 5 of 5 strength in the bilateral upper and lower extremities, normal finger-to-nose testing   ED Results / Procedures / Treatments   Labs (all labs ordered are listed, but only abnormal results are displayed) Labs  Reviewed  BASIC METABOLIC PANEL - Abnormal; Notable for the following components:      Result Value   Glucose, Bld 118 (*)    All other components within normal limits  CBC - Abnormal; Notable for the following components:   WBC 11.8 (*)    All other components within normal limits  HEPATIC FUNCTION PANEL - Abnormal; Notable for the following components:   Total Protein 6.4 (*)    All other components within normal limits  URINALYSIS, W/ REFLEX TO CULTURE (INFECTION SUSPECTED) - Abnormal; Notable for the following components:   Color, Urine YELLOW (*)    APPearance CLEAR (*)    Specific Gravity, Urine 1.034 (*)    All other components within normal limits  MAGNESIUM  TSH  TROPONIN I (HIGH SENSITIVITY)  TROPONIN I (HIGH SENSITIVITY)     EKG EKG independently reviewed interpreted by myself (ER attending) demonstrates:  EKG demonstrates sinus bradycardia with PAC and 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  Differential diagnosis includes, but is not limited to, strongly suspect developing of bradycardia in the setting of beta-blocker administration, consideration for anemia, electrolyte abnormality, UTI  Patient's presentation is most consistent with acute presentation with potential threat to life or bodily function.  82 year old male presenting with dizziness exacerbated after metoprolol administration, bradycardic on presentation here.  Fortunately no hypotension.  Labs  overall reassuring.  Urine without evidence of infection.  However, patient did have some ongoing discomfort in his lower abdomen and bladder scan demonstrated 550 cc of urine prior to receiving fluids.  Did discuss Foley catheter placement and urology follow-up, patient repeatedly declined this.  I did also discuss in and out catheter which patient initially declined, but had worsening pain when receiving IV fluids and is now agreeable to this.  RN notified.  With observation here, heart rate has improved to the mid 50s.  Did discuss that metoprolol has a prolonged half-life, but with patient's symptoms improving here I do not think it is unreasonable for him to be discharged if his symptoms have improved.  He would like to be discharged if possible.  Signed out ongoing provider pending IV fluid resuscitation, ambulation trial, and disposition.      FINAL CLINICAL IMPRESSION(S) / ED DIAGNOSES   Final diagnoses:  Bradycardia  Dizziness     Rx / DC Orders   ED Discharge Orders     None        Note:  This document was prepared using Dragon voice recognition software and may include unintentional dictation errors.   Trinna Post, MD 04/17/23 [phone]

## 2023-04-17 NOTE — ED Notes (Signed)
Pt still very dizzy and lightheaded when changing positions. Pt able to sit on edge of bed and urinate with assistance.

## 2023-04-21 ENCOUNTER — Telehealth: Payer: Self-pay | Admitting: *Deleted

## 2023-04-21 DIAGNOSIS — H5211 Myopia, right eye: Secondary | ICD-10-CM | POA: Diagnosis not present

## 2023-04-21 DIAGNOSIS — H2513 Age-related nuclear cataract, bilateral: Secondary | ICD-10-CM | POA: Diagnosis not present

## 2023-04-21 DIAGNOSIS — E119 Type 2 diabetes mellitus without complications: Secondary | ICD-10-CM | POA: Diagnosis not present

## 2023-04-21 LAB — HM DIABETES EYE EXAM

## 2023-04-21 MED ORDER — ATORVASTATIN CALCIUM 20 MG PO TABS: 20.0000 mg | ORAL_TABLET | Freq: Every day | ORAL | 3 refills | Status: DC

## 2023-04-21 MED ORDER — ASPIRIN 81 MG PO TBEC: 81.0000 mg | DELAYED_RELEASE_TABLET | Freq: Every day | ORAL | 3 refills | Status: DC

## 2023-04-21 NOTE — Telephone Encounter (Signed)
Pt was returning nurse call and is requesting a callback. Please advise. 

## 2023-04-21 NOTE — Telephone Encounter (Signed)
-----   Message from Debbe Odea, MD sent at 04/17/2023  6:24 PM EDT ----- Coronary CT shows mild nonobstructive CAD.  Recommend aspirin 81 mg daily, Lipitor 20 mg daily.

## 2023-04-21 NOTE — Telephone Encounter (Signed)
Left voicemail message to call back  

## 2023-04-21 NOTE — Telephone Encounter (Signed)
Left a message for the patient to call back.  

## 2023-04-22 ENCOUNTER — Ambulatory Visit: Payer: PPO | Attending: Family Medicine

## 2023-04-22 DIAGNOSIS — R0609 Other forms of dyspnea: Secondary | ICD-10-CM

## 2023-04-22 LAB — ECHOCARDIOGRAM COMPLETE
AR max vel: 3.52 cm2
AV Area VTI: 4.3 cm2
AV Area mean vel: 3.34 cm2
AV Mean grad: 2.5 mmHg
AV Peak grad: 4.3 mmHg
Ao pk vel: 1.04 m/s
Area-P 1/2: 2.56 cm2
S' Lateral: 3.7 cm

## 2023-04-22 MED ORDER — PERFLUTREN LIPID MICROSPHERE
1.0000 mL | INTRAVENOUS | Status: AC | PRN
Start: 2023-04-22 — End: 2023-04-22
  Administered 2023-04-22: 2 mL via INTRAVENOUS

## 2023-04-22 NOTE — Telephone Encounter (Signed)
Patient advised of Dr. Myriam Forehand message. Patient verbalizes understanding. Patient states he will take the aspirin but needs to think about the Lipitor and would like to discuss at f/u appointment on 7/8. Patient advised that both medications have been sent to the pharmacy.

## 2023-04-22 NOTE — Telephone Encounter (Signed)
Left VM to call back 

## 2023-04-22 NOTE — Telephone Encounter (Signed)
Patient is returning call. Transferred to [place], Charity fundraiser.

## 2023-04-28 ENCOUNTER — Encounter: Payer: Self-pay | Admitting: Cardiology

## 2023-04-28 ENCOUNTER — Ambulatory Visit: Payer: PPO | Attending: Cardiology | Admitting: Cardiology

## 2023-04-28 VITALS — BP 134/90 | HR 60 | Ht 74.0 in | Wt 262.0 lb

## 2023-04-28 DIAGNOSIS — I2510 Atherosclerotic heart disease of native coronary artery without angina pectoris: Secondary | ICD-10-CM

## 2023-04-28 DIAGNOSIS — E782 Mixed hyperlipidemia: Secondary | ICD-10-CM | POA: Diagnosis not present

## 2023-04-28 MED ORDER — ASPIRIN 81 MG PO TBEC: 81.0000 mg | DELAYED_RELEASE_TABLET | Freq: Every day | ORAL | 3 refills | Status: AC

## 2023-04-28 MED ORDER — ATORVASTATIN CALCIUM 20 MG PO TABS: 20.0000 mg | ORAL_TABLET | Freq: Every day | ORAL | 3 refills | Status: DC

## 2023-04-28 NOTE — Patient Instructions (Signed)
Medication Instructions:   START Aspirin - Take one tablet ( 81mg ) by mouth daily. 2. START Atorvastatin - Take one tablet ( 20mg ) by mouth daily.   *If you need a refill on your cardiac medications before your next appointment, please call your pharmacy*   Lab Work:  Your physician recommends that you return for lab work in: 3 months at the medical mall. You will need to be fasting.  No appt is needed. Hours are M-F 7AM- 6 PM.  If you have labs (blood work) drawn today and your tests are completely normal, you will receive your results only by: MyChart Message (if you have MyChart) OR A paper copy in the mail If you have any lab test that is abnormal or we need to change your treatment, we will call you to review the results.   Testing/Procedures:  None Ordered    Follow-Up: At [facility], you and your health needs are our priority.  As part of our continuing mission to provide you with exceptional heart care, we have created designated Provider Care Teams.  These Care Teams include your primary Cardiologist (physician) and Advanced Practice Providers (APPs -  Physician Assistants and Nurse Practitioners) who all work together to provide you with the care you need, when you need it.  We recommend signing up for the patient portal called "MyChart".  Sign up information is provided on this After Visit Summary.  MyChart is used to connect with patients for Virtual Visits (Telemedicine).  Patients are able to view lab/test results, encounter notes, upcoming appointments, etc.  Non-urgent messages can be sent to your provider as well.   To learn more about what you can do with MyChart, go to [email or web address].    Your next appointment:   4 month(s)  Provider:   You may see Debbe Odea, MD or one of the following Advanced Practice Providers on your designated Care Team:   Nicolasa Ducking, NP Eula Listen, PA-C Cadence Fransico Michael, PA-C Charlsie Quest, NP

## 2023-04-28 NOTE — Progress Notes (Signed)
Cardiology Office Note:    Date:  04/28/2023   ID:  Bryan Thomas, DOB 01-09-1941, MRN [id number]  PCP:  Sandre Kitty, MD   [facility] Providers Cardiologist:  Debbe Odea, MD     Referring MD: Sandre Kitty, MD   Chief Complaint  Patient presents with   Follow-up    Discuss cardiac testing.  Recent ED visit for dizziness that has now improved.  The dizziness episode happened after cardiac CT Metoprolol dose.  Has not started ASA or Atorvastatin.   History of Present Illness:    Bryan Thomas is a 82 y.o. male with a hx of diabetes, former smoker x 30+ years who presents for follow-up.  Previously seen due to chest pain.  Echocardiogram and coronary CT obtained due to risk factors of diabetes, former smoker.  He states doing okay, denies chest pain, states having a little "tweak" on the left side of his chest under his armpit.  Wants to discuss cardiac results testing.  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 Swallow whole.   atorvastatin (LIPITOR) 20 MG tablet Take 1 tablet (20 mg total) by mouth daily.   clobetasol cream (TEMOVATE) 0.05 % Apply 1 Application topically 2 (two) times daily.   metFORMIN (GLUCOPHAGE-XR) 750 MG 24 hr tablet Take 1 tablet (750 mg total) by mouth daily with breakfast.     Allergies:   Oxycodone   Social History   Socioeconomic History   Marital status: Widowed    Spouse name: Not on file   Number of  children: 4   Years of education: Not on file   Highest education level: Not on file  Occupational History   Occupation: Service work--mechanic    Comment: tired  Tobacco Use   Smoking status: Former    Packs/day: 1.00    Years: 40.00    Additional pack years: 0.00    Total pack years: 40.00    Types: Cigarettes    Quit date: 1990    Years since quitting: 34.5    Passive exposure: Past   Smokeless tobacco: Former    Types: Chew    Quit date: 08/21/2021  Vaping Use   Vaping Use: Never used  Substance and Sexual Activity   Alcohol use: No    Alcohol/week: 0.0 standard drinks of alcohol   Drug use: Never   Sexual activity: Not on file  Other Topics Concern   Not on file  Social History Narrative   Widowed 1/22      Has living will   Daughter Vernona Rieger is health care POA   Would accept resuscitation attempts--but no prolonged artificial ventilation   No tube feeds if cognitively unaware   Social Determinants of Health   Financial Resource Strain: Low Risk  (02/11/2023)   Overall Financial Resource Strain (CARDIA)    Difficulty of Paying Living Expenses: Not hard at all  Food Insecurity: No Food Insecurity (  02/11/2023)   Hunger Vital Sign    Worried About Running Out of Food in the Last Year: Never true    Ran Out of Food in the Last Year: Never true  Transportation Needs: No Transportation Needs (02/11/2023)   PRAPARE - Administrator, Civil Service (Medical): No    Lack of Transportation (Non-Medical): No  Physical Activity: Inactive (02/11/2023)   Exercise Vital Sign    Days of Exercise per Week: 0 days    Minutes of Exercise per Session: 0 min  Stress: No Stress Concern Present (02/11/2023)   [employer] of Occupational Health - Occupational Stress Questionnaire    Feeling of Stress : Not at all  Social Connections: Socially Integrated (02/11/2023)   Social Connection and Isolation Panel [NHANES]    Frequency of Communication with Friends and Family:  More than three times a week    Frequency of Social Gatherings with Friends and Family: More than three times a week    Attends Religious Services: More than 4 times per year    Active Member of [employer] or Organizations: Yes    Attends Engineer, structural: More than 4 times per year    Marital Status: Married     Family History: The patient's family history includes Aneurysm in his father; Arthritis in his mother; Breast cancer in his mother; Cancer in his mother; Diabetes in his brother, brother, brother, and sister; Heart disease in his brother and brother.  ROS:   Please see the history of present illness.     All other systems reviewed and are negative.  EKGs/Labs/Other Studies Reviewed:    The following studies were reviewed today:   EKG:  EKG not ordered today.    Recent Labs: 04/17/2023: ALT 20; BUN 15; Creatinine, Ser 0.77; Hemoglobin 14.2; Magnesium 2.1; Platelets 255; Potassium 4.3; Sodium 135; TSH 1.304  Recent Lipid Panel    Component Value Date/Time   CHOL 171 01/15/2023 0924   TRIG 227 (H) 01/15/2023 0924   HDL 29 (L) 01/15/2023 0924   CHOLHDL 5.9 (H) 01/15/2023 0924   LDLCALC 103 (H) 01/15/2023 0924     Risk Assessment/Calculations:         Physical Exam:    VS:  BP (!) 134/90 (BP Location: Left Arm, Patient Position: Sitting, Cuff Size: Large)   Pulse 60   Ht 6\' 2"  (1.88 m)   Wt 262 lb (118.8 kg)   SpO2 97%   BMI 33.64 kg/m     Wt Readings from Last 3 Encounters:  04/28/23 262 lb (118.8 kg)  04/17/23 263 lb 0.1 oz (119.3 kg)  04/07/23 263 lb (119.3 kg)     GEN:  Well nourished, well developed in no acute distress HEENT: Normal NECK: No JVD; No carotid bruits RESPIRATORY:  Clear to auscultation without rales, wheezing or rhonchi  ABDOMEN: Soft, non-tender, non-distended MUSCULOSKELETAL:  No edema; No deformity  SKIN: Warm and dry NEUROLOGIC:  Alert and oriented x 3 PSYCHIATRIC:  Normal affect   ASSESSMENT:    1. Coronary artery  disease involving native coronary artery of native heart, unspecified whether angina present   2. Mixed hyperlipidemia    PLAN:    In order of problems listed above:  Mild nonobstructive CAD proximal LAD and RCA on CCTA 6/24,  Calcium score 624.  Echo with normal EF 55%.  Start aspirin 81 mg daily, Lipitor 20 mg daily.  Repeat lipid panel in 3 months. Hyperlipidemia, start Lipitor 20 mg daily.  Repeat lipid panel in 3 months as above.  Follow-up in 4 months.     Medication Adjustments/Labs and Tests Ordered: Current medicines are reviewed at length with the patient today.  Concerns regarding medicines are outlined above.  Orders Placed This Encounter  Procedures   Lipid panel   Meds ordered this encounter  Medications   aspirin EC 81 MG tablet    Sig: Take 1 tablet (81 mg total) by mouth daily. Swallow whole.    Dispense:  90 tablet    Refill:  3   atorvastatin (LIPITOR) 20 MG tablet    Sig: Take 1 tablet (20 mg total) by mouth daily.    Dispense:  90 tablet    Refill:  3    Patient Instructions  Medication Instructions:   START Aspirin - Take one tablet ( 81mg ) by mouth daily. 2. START Atorvastatin - Take one tablet ( 20mg ) by mouth daily.   *If you need a refill on your cardiac medications before your next appointment, please call your pharmacy*   Lab Work:  Your physician recommends that you return for lab work in: 3 months at the medical mall. You will need to be fasting.  No appt is needed. Hours are M-F 7AM- 6 PM.  If you have labs (blood work) drawn today and your tests are completely normal, you will receive your results only by: MyChart Message (if you have MyChart) OR A paper copy in the mail If you have any lab test that is abnormal or we need to change your treatment, we will call you to review the results.   Testing/Procedures:  None Ordered    Follow-Up: At [facility], you and your health needs are our priority.  As part of our  continuing mission to provide you with exceptional heart care, we have created designated Provider Care Teams.  These Care Teams include your primary Cardiologist (physician) and Advanced Practice Providers (APPs -  Physician Assistants and Nurse Practitioners) who all work together to provide you with the care you need, when you need it.  We recommend signing up for the patient portal called "MyChart".  Sign up information is provided on this After Visit Summary.  MyChart is used to connect with patients for Virtual Visits (Telemedicine).  Patients are able to view lab/test results, encounter notes, upcoming appointments, etc.  Non-urgent messages can be sent to your provider as well.   To learn more about what you can do with MyChart, go to [email or web address].    Your next appointment:   4 month(s)  Provider:   You may see Debbe Odea, MD or one of the following Advanced Practice Providers on your designated Care Team:   Nicolasa Ducking, NP Eula Listen, PA-C Cadence Fransico Michael, PA-C Charlsie Quest, NP   Signed, Debbe Odea, MD  04/28/2023 10:27 AM     [facility]

## 2023-04-30 ENCOUNTER — Encounter: Payer: Self-pay | Admitting: Family Medicine

## 2023-05-12 ENCOUNTER — Ambulatory Visit (INDEPENDENT_AMBULATORY_CARE_PROVIDER_SITE_OTHER): Payer: PPO | Admitting: Family Medicine

## 2023-05-12 ENCOUNTER — Encounter: Payer: Self-pay | Admitting: Family Medicine

## 2023-05-12 VITALS — BP 137/87 | HR 64 | Ht 74.0 in | Wt 265.0 lb

## 2023-05-12 DIAGNOSIS — N401 Enlarged prostate with lower urinary tract symptoms: Secondary | ICD-10-CM | POA: Diagnosis not present

## 2023-05-12 DIAGNOSIS — R351 Nocturia: Secondary | ICD-10-CM

## 2023-05-12 DIAGNOSIS — R5382 Chronic fatigue, unspecified: Secondary | ICD-10-CM

## 2023-05-12 DIAGNOSIS — I2510 Atherosclerotic heart disease of native coronary artery without angina pectoris: Secondary | ICD-10-CM | POA: Diagnosis not present

## 2023-05-12 DIAGNOSIS — E1165 Type 2 diabetes mellitus with hyperglycemia: Secondary | ICD-10-CM | POA: Diagnosis not present

## 2023-05-12 MED ORDER — TAMSULOSIN HCL 0.4 MG PO CAPS
0.4000 mg | ORAL_CAPSULE | Freq: Every day | ORAL | 3 refills | Status: DC
Start: 2023-05-12 — End: 2024-06-15

## 2023-05-12 NOTE — Patient Instructions (Addendum)
It was nice to see you today,  We addressed the following topics today: - I started flomax.  Take this once a day.  - I am ordering a home sleep study.  Someone should call you to set this up.   - continue to take your medications - follow up with me in September.    Have a great day,  Frederic Jericho, MD

## 2023-05-12 NOTE — Progress Notes (Signed)
   Established Patient Office Visit  Subjective   Patient ID: Bryan Thomas, male    DOB: [birth year]  Age: 82 y.o. MRN: [id number]  Chief Complaint  Patient presents with   Follow-up    HPI  Dm2 -tolerating his medication well.  No GI complaints.  No questions or concerns  Patient is taking his aspirin and statin.  No questions or concerns  Headaches -still having headaches every day.  Dull aching.  Also complaining of decreased energy still.  Patient gets up 2-3 times a night to urinate.  Is having issues with initiating urination and weak stream.  We discussed Flomax.  Patient willing to try it.    The ASCVD Risk score (Arnett DK, et al., 2019) failed to calculate for the following reasons:   The 2019 ASCVD risk score is only valid for ages 20 to 46     ROS    Objective:     BP 137/87   Pulse 64   Ht 6\' 2"  (1.88 m)   Wt 265 lb (120.2 kg)   SpO2 95%   BMI 34.02 kg/m    Physical Exam General: Alert and oriented.  Accompanied by daughter CV: Regular rate and rhythm no murmurs Pulmonary: Lungs clear bilaterally    No results found for any visits on 05/12/23.        Assessment & Plan:   BPH associated with nocturia Assessment & Plan: Flomax started   Type 2 diabetes mellitus with hyperglycemia, without long-term current use of insulin (HCC) Assessment & Plan: Continue current medication   Coronary artery disease involving native heart without angina pectoris, unspecified vessel or lesion type Assessment & Plan: Continue aspirin and statin   Chronic fatigue Assessment & Plan: After his cardiac workup showed only mild nonobstructive CAD, seems more likely his fatigue and possibly headaches as well or due to obstructive sleep apnea.  Per the patient and his daughter he snores very loudly.  Has headaches and daytime fatigue.  STOP-BANG score indicates he is at high risk for OSA.  Patient not welcoming to the idea of using a CPAP machine.   Advised him it is still beneficial to get his sleep study done so that we can at least rule in or out this as a cause of his symptoms.  Patient agreeable to this - Follow-up home sleep study results.   Other orders -     Tamsulosin HCl; Take 1 capsule (0.4 mg total) by mouth daily.  Dispense: 30 capsule; Refill: 3     No follow-ups on file.    Sandre Kitty, MD

## 2023-05-17 DIAGNOSIS — R5382 Chronic fatigue, unspecified: Secondary | ICD-10-CM | POA: Insufficient documentation

## 2023-05-17 DIAGNOSIS — N401 Enlarged prostate with lower urinary tract symptoms: Secondary | ICD-10-CM | POA: Insufficient documentation

## 2023-05-17 DIAGNOSIS — I2510 Atherosclerotic heart disease of native coronary artery without angina pectoris: Secondary | ICD-10-CM | POA: Insufficient documentation

## 2023-05-17 NOTE — Assessment & Plan Note (Signed)
Flomax started

## 2023-05-17 NOTE — Assessment & Plan Note (Signed)
Continue current medication.

## 2023-05-17 NOTE — Assessment & Plan Note (Signed)
After his cardiac workup showed only mild nonobstructive CAD, seems more likely his fatigue and possibly headaches as well or due to obstructive sleep apnea.  Per the patient and his daughter he snores very loudly.  Has headaches and daytime fatigue.  STOP-BANG score indicates he is at high risk for OSA.  Patient not welcoming to the idea of using a CPAP machine.  Advised him it is still beneficial to get his sleep study done so that we can at least rule in or out this as a cause of his symptoms.  Patient agreeable to this - Follow-up home sleep study results.

## 2023-05-17 NOTE — Assessment & Plan Note (Signed)
Continue aspirin and statin. 

## 2023-05-28 IMAGING — CR DG CHEST 2V
2 series · 2 of 2 positions shown · non-contrast
Comparison: Radiographs dated September 10, 2021

CLINICAL DATA: chronic cough chronic cough

EXAM:
CHEST - 2 VIEW

[w chest pa]
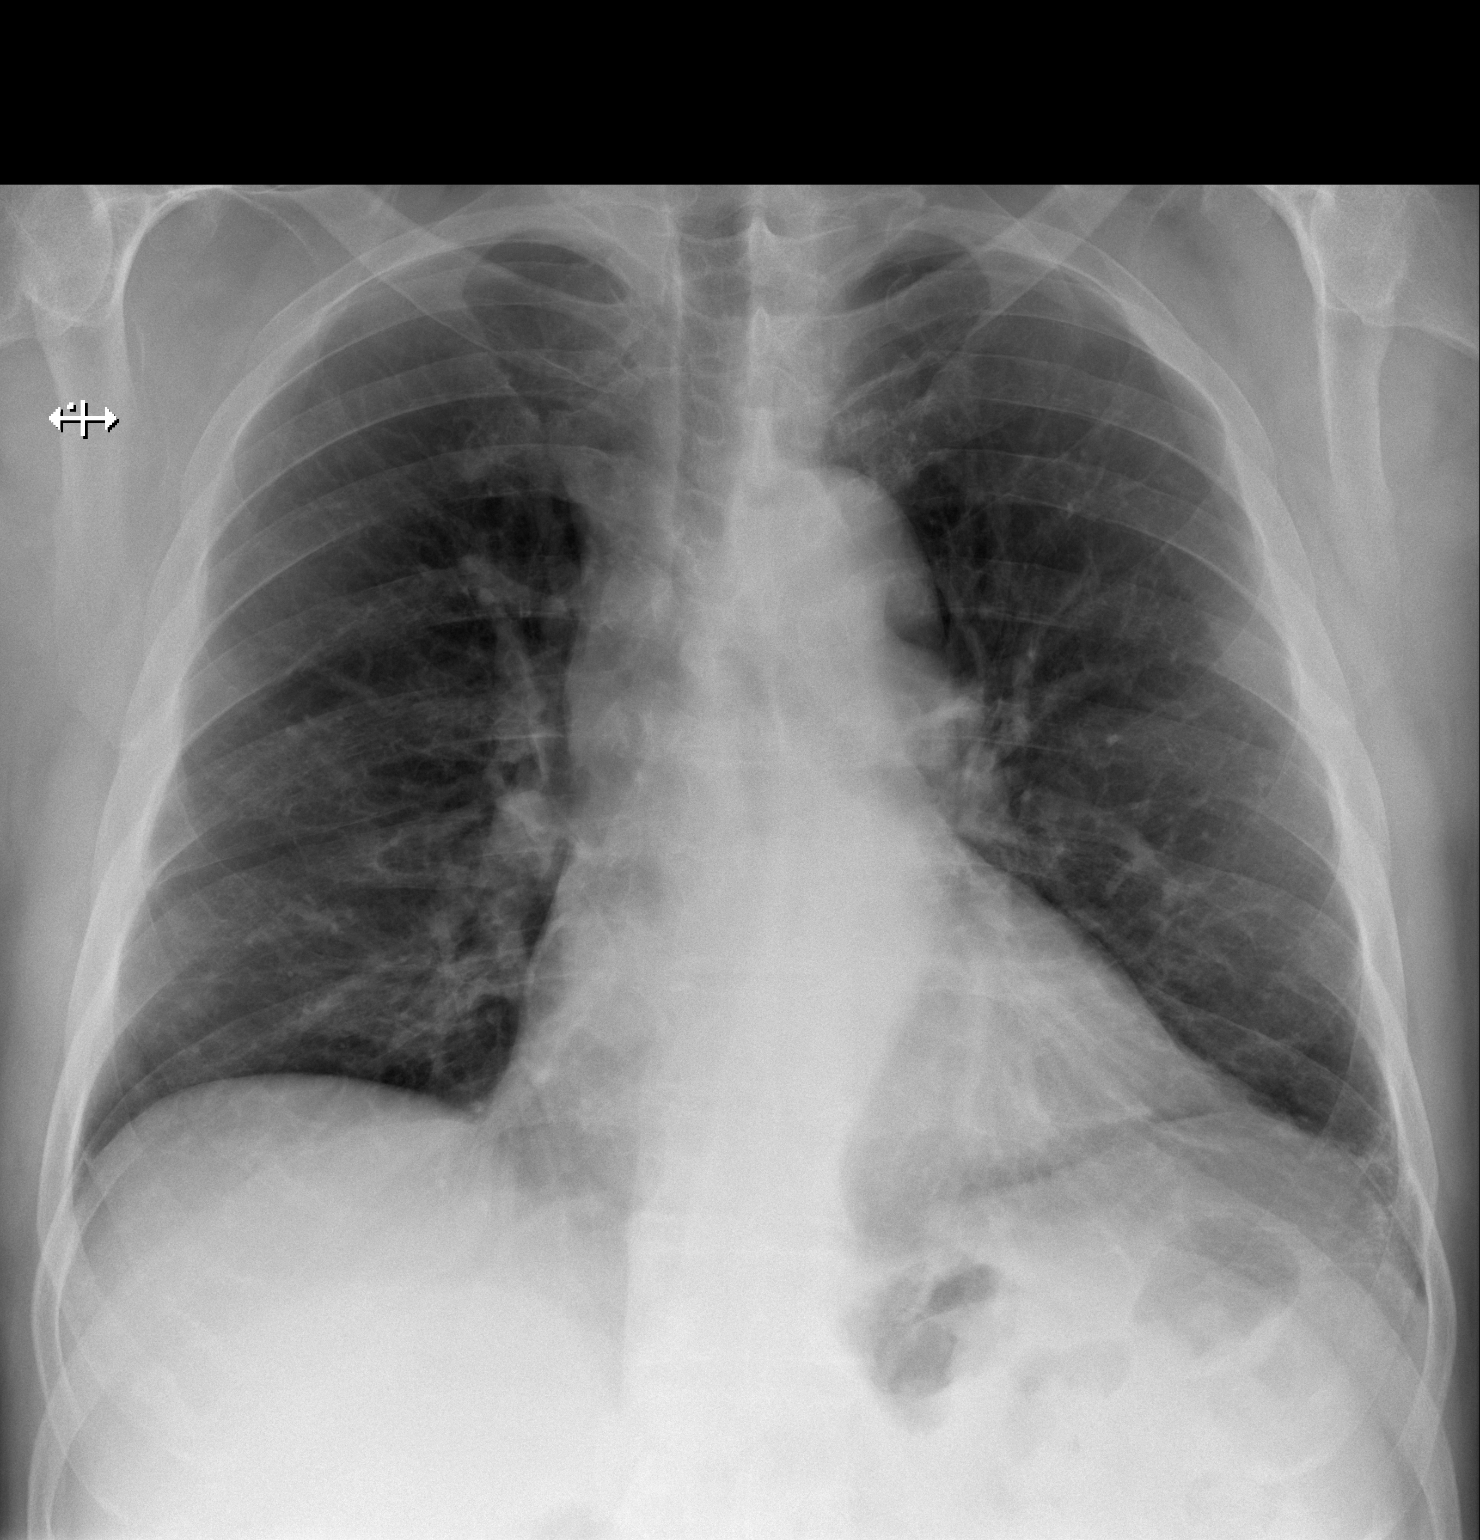

[w chest lat]
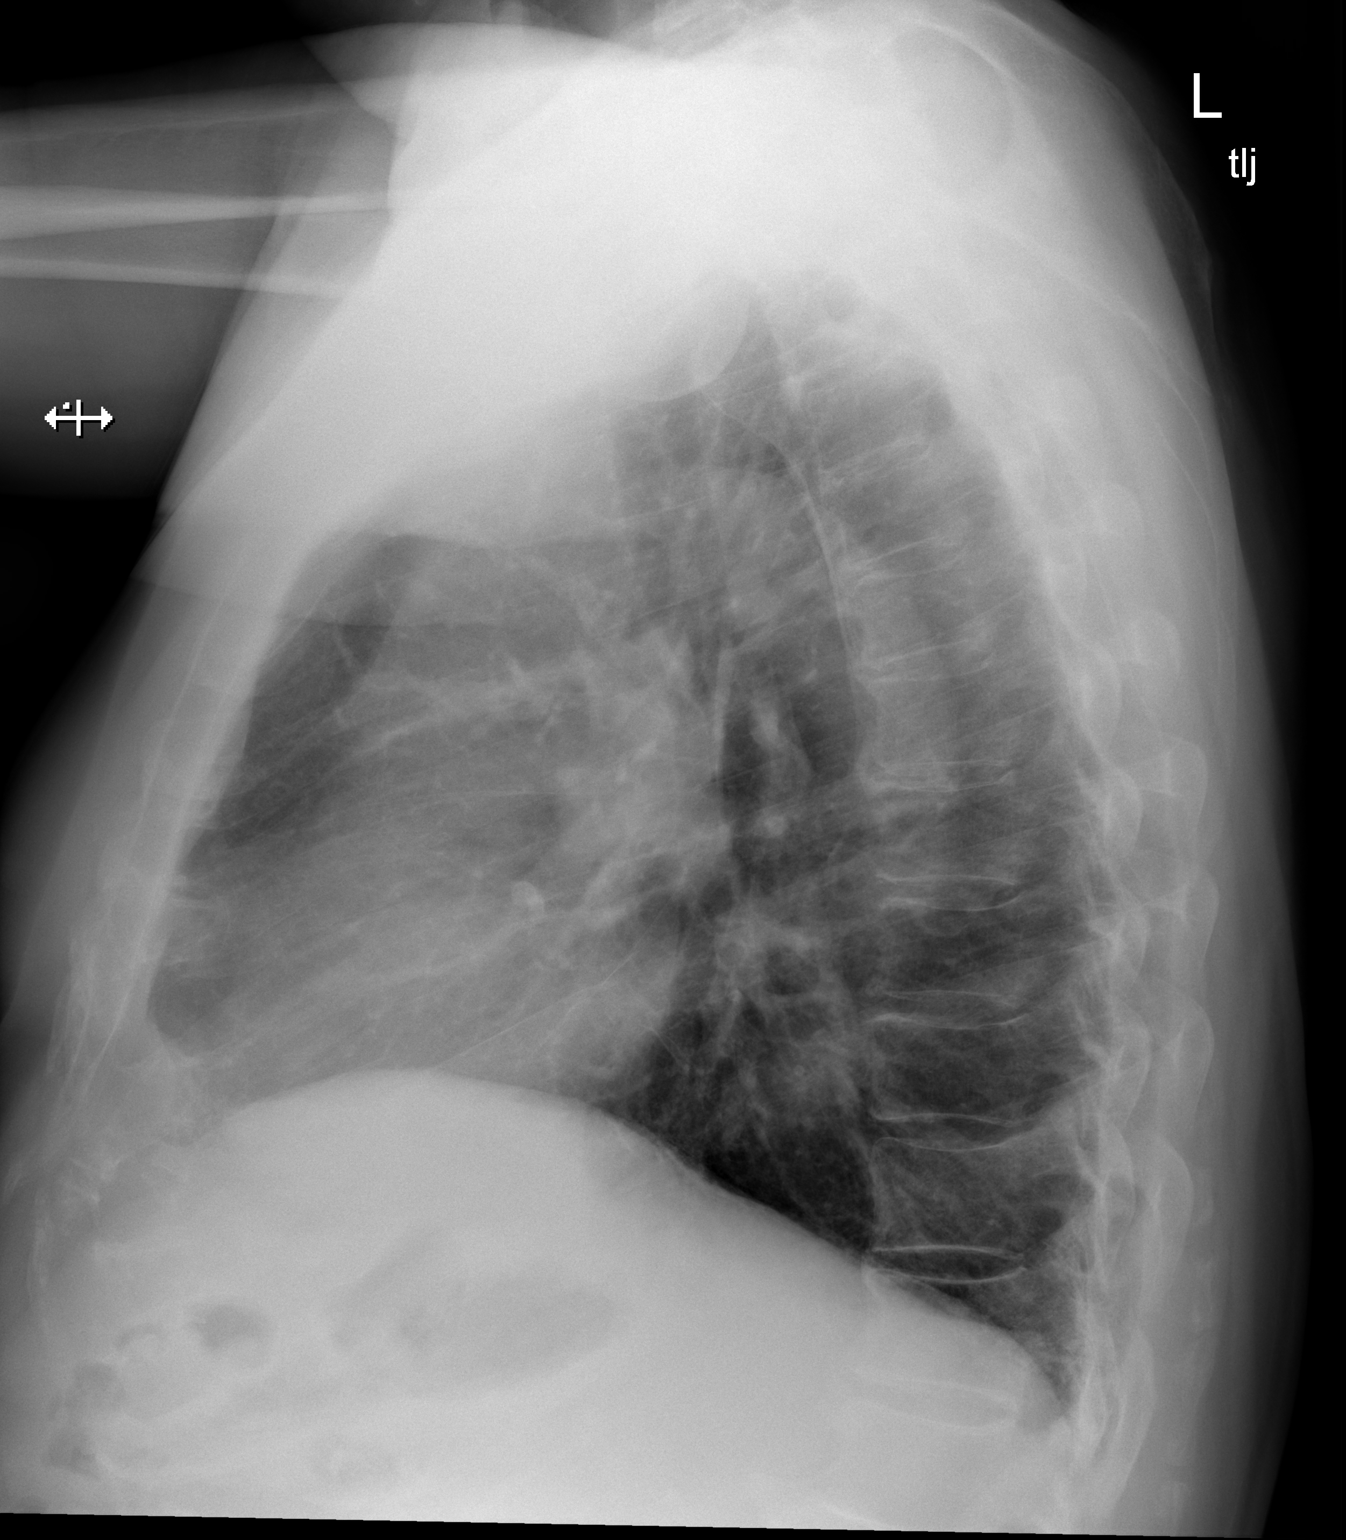

[2 of 2 positions shown; findings below may reference images not displayed]

FINDINGS: The cardiomediastinal silhouette is unchanged in contour. No pleural
effusion. No pneumothorax. No acute pleuroparenchymal abnormality.
Visualized abdomen is unremarkable. Mild degenerative changes of the
thoracic spine.
IMPRESSION: No acute cardiopulmonary abnormality.

## 2023-07-08 ENCOUNTER — Encounter: Payer: Self-pay | Admitting: Family Medicine

## 2023-07-08 ENCOUNTER — Ambulatory Visit (INDEPENDENT_AMBULATORY_CARE_PROVIDER_SITE_OTHER): Payer: PPO | Admitting: Family Medicine

## 2023-07-08 VITALS — BP 115/64 | HR 62 | Ht 74.0 in | Wt 260.8 lb

## 2023-07-08 DIAGNOSIS — R351 Nocturia: Secondary | ICD-10-CM | POA: Diagnosis not present

## 2023-07-08 DIAGNOSIS — R42 Dizziness and giddiness: Secondary | ICD-10-CM

## 2023-07-08 DIAGNOSIS — E1165 Type 2 diabetes mellitus with hyperglycemia: Secondary | ICD-10-CM

## 2023-07-08 DIAGNOSIS — N401 Enlarged prostate with lower urinary tract symptoms: Secondary | ICD-10-CM | POA: Diagnosis not present

## 2023-07-08 LAB — POCT GLYCOSYLATED HEMOGLOBIN (HGB A1C): HbA1c POC (<> result, manual entry): 6.5 % (ref 4.0–5.6)

## 2023-07-08 NOTE — Assessment & Plan Note (Signed)
Lab Results  Component Value Date   HGBA1C 6.5 07/08/2023   HGBA1C 6.7 (A) 04/07/2023   HGBA1C 7.0 (A) 01/15/2023   Patient compliant with his medication.  Will try switching his medications to nightly due to his transient side effects

## 2023-07-08 NOTE — Assessment & Plan Note (Signed)
Patient not taking his Flomax.  Took it for 3 to 4 days on 2 different occasions and both times made him feel worse with headache and worsening dizziness.  Still symptomatic with enlarged prostate symptoms but does not want to take additional medications at this time.

## 2023-07-08 NOTE — Progress Notes (Unsigned)
Established Patient Office Visit  Subjective   Patient ID: Bryan Thomas, male    DOB: 12/10/1940  Age: 82 y.o. MRN: [id number]  Chief Complaint  Patient presents with   Medical Management of Chronic Issues    HPI   BPH-patient not taking his Flomax anymore.  made him sick when he took it.  Symptoms included headache, dizziness.  He took it for 4 days, stopped taking it and symptoms improved.  Then took it for 3 more days and the symptoms returned.  He has not stopped taking it.  Patient is not interested in seeing any other specialist.  Dizziness-patient complains of dizziness, blurred vision or a few hours after taking his medications in the morning.  This goes away.  We talked about this dizziness versus his previous dizziness when standing.  He says that is improved but still present.  We discussed compression stockings and how they treat orthostatic hypotension  Diabetes-patient compliant with his metformin.  Does not give him diarrhea or stomach complaints.  Has some come plaints of dizziness, blurred vision after taking his medications (takes all 3 of them together).  Discussed taking them at night before bedtime.  The ASCVD Risk score (Arnett DK, et al., 2019) failed to calculate for the following reasons:   The 2019 ASCVD risk score is only valid for ages 81 to 57  Health Maintenance Due  Topic Date Due   FOOT EXAM  Never done   Diabetic kidney evaluation - Urine ACR  Never done   INFLUENZA VACCINE  Never done      Objective:     BP 115/64   Pulse 62   Ht 6\' 2"  (1.88 m)   Wt 260 lb 12.8 oz (118.3 kg)   SpO2 97%   BMI 33.48 kg/m    Physical Exam General: Alert and oriented.  Accompanied by daughter CV: Regular rhythm Pulmonary: Lungs clear bilaterally Psych: Pleasant.   Results for orders placed or performed in visit on 07/08/23  POCT glycosylated hemoglobin (Hb A1C)  Result Value Ref Range   Hemoglobin A1C     HbA1c POC (<> result, manual entry) 6.5  4.0 - 5.6 %   HbA1c, POC (prediabetic range)     HbA1c, POC (controlled diabetic range)          Assessment & Plan:   Type 2 diabetes mellitus with hyperglycemia, without long-term current use of insulin [facility]) Assessment & Plan: Lab Results  Component Value Date   HGBA1C 6.5 07/08/2023   HGBA1C 6.7 (A) 04/07/2023   HGBA1C 7.0 (A) 01/15/2023   Patient compliant with his medication.  Will try switching his medications to nightly due to his transient side effects  Orders: -     Microalbumin / creatinine urine ratio -     POCT glycosylated hemoglobin (Hb A1C)  Dizziness Assessment & Plan: Overall the dizziness upon standing is improved but still present.  Daughter states she notices that when he stands up.  Discussed wearing compression stockings to help with orthostasis.   BPH associated with nocturia Assessment & Plan: Patient not taking his Flomax.  Took it for 3 to 4 days on 2 different occasions and both times made him feel worse with headache and worsening dizziness.  Still symptomatic with enlarged prostate symptoms but does not want to take additional medications at this time.      Return in about 3 months (around 10/07/2023) for DM.    Sandre Kitty, MD

## 2023-07-08 NOTE — Patient Instructions (Signed)
It was nice to see you today,  We addressed the following topics today: -Continue taking your metformin and Lipitor and aspirin.  You can take these medications at night if you are having symptoms during the day that bother you. - To help with the dizziness when you stand up I would recommend compression stockings.  Look for ones that have a pressure rating of 20 to 30 mmHg. - For constipation you can use over-the-counter MiraLAX or Metamucil.  You can take this once a day and titrate up to 2 or 3 doses a day as needed.  Goal should be to have 1 bowel movement every day or every 2 days at most.  Have a great day,  Frederic Jericho, MD

## 2023-07-08 NOTE — Assessment & Plan Note (Signed)
Overall the dizziness upon standing is improved but still present.  Daughter states she notices that when he stands up.  Discussed wearing compression stockings to help with orthostasis.

## 2023-08-29 ENCOUNTER — Ambulatory Visit: Payer: PPO | Admitting: Cardiology

## 2023-09-07 ENCOUNTER — Other Ambulatory Visit: Payer: Self-pay

## 2023-09-07 NOTE — Progress Notes (Signed)
This patient is appearing on a report for being at risk of failing the adherence measure for cholesterol (statin) and diabetes medications this calendar year.   Medication: atorvastatin 20 mg PO daily, metformin XR 750 mg PO daily Last fill date atorvastatin: 04/21/23 for 90 day supply Last fill date metformin: 04/07/23 for 90 day supply  Engaged patient to discuss adherence to metformin and statin. Pt stated that he stopped taking both metformin and atorvastatin. He had decided to get a second opinion from another physician because he felt he was taking too much medication. He was having severe muscle aches (bilateral LE) and fatigue, which have improved since he stopped taking the medications. He still has some residual pain in the backs of his thighs.   Appears that pt hx of CAD consists of non-obstructive CAD and elevated CAC noted on coronary CTA from  04/17/23: 25-49% stenosis 2/2 calcified plaque in the LAD and RCA, CAC score of 624 (54th percentile for age). Atorvastatin 20 mg daily was initiated 04/28/23. Benefit of initiating statins for ACS prevention is relatively unknown in patients > 75, may be appropriate to discontinue therapy vs try alternative statin at lower dose at PCP f/u on 12/17, as it appears pt was having statin induced muscle AE.   Discussed that patient would likely benefit from continued treatment with metformin. Kidney function normal. A1c was well controlled on 750 mg PO daily. Should engage in further risk/benefit discussion with patient at PCP visit on 10/07/23.   Nils Pyle, PharmD PGY1 Pharmacy Resident

## 2023-10-07 ENCOUNTER — Ambulatory Visit: Payer: PPO | Admitting: Family Medicine

## 2023-11-05 ENCOUNTER — Ambulatory Visit: Payer: PPO | Admitting: Cardiology

## 2023-12-25 DIAGNOSIS — H903 Sensorineural hearing loss, bilateral: Secondary | ICD-10-CM | POA: Diagnosis not present

## 2024-01-12 DIAGNOSIS — R3912 Poor urinary stream: Secondary | ICD-10-CM | POA: Diagnosis not present

## 2024-01-12 DIAGNOSIS — N401 Enlarged prostate with lower urinary tract symptoms: Secondary | ICD-10-CM | POA: Diagnosis not present

## 2024-01-12 DIAGNOSIS — R351 Nocturia: Secondary | ICD-10-CM | POA: Diagnosis not present

## 2024-01-12 DIAGNOSIS — R350 Frequency of micturition: Secondary | ICD-10-CM | POA: Diagnosis not present

## 2024-01-12 DIAGNOSIS — R972 Elevated prostate specific antigen [PSA]: Secondary | ICD-10-CM | POA: Diagnosis not present

## 2024-03-04 ENCOUNTER — Ambulatory Visit: Payer: PPO

## 2024-03-04 DIAGNOSIS — Z0000 Encounter for general adult medical examination without abnormal findings: Secondary | ICD-10-CM | POA: Diagnosis not present

## 2024-03-04 NOTE — Patient Instructions (Signed)
 Mr. Bryan Thomas , Thank you for taking time out of your busy schedule to complete your Annual Wellness Visit with me. I enjoyed our conversation and look forward to speaking with you again next year. I, as well as your care team,  appreciate your ongoing commitment to your health goals. Please review the following plan we discussed and let me know if I can assist you in the future. Your Game plan/ To Do List    Referrals: If you haven't heard from the office you've been referred to, please reach out to them at the phone provided.  N/a Follow up Visits: Next Medicare AWV with our clinical staff: 03/31/2025 at 8:10   Have you seen your provider in the last 6 months (3 months if uncontrolled diabetes)? No Next Office Visit with your provider: declines making appointment at this time.  Clinician Recommendations:  Aim for 30 minutes of exercise or brisk walking, 6-8 glasses of water , and 5 servings of fruits and vegetables each day.       This is a list of the screening recommended for you and due dates:  Health Maintenance  Topic Date Due   Complete foot exam   Never done   Hemoglobin A1C  01/05/2024   Yearly kidney function blood test for diabetes  04/16/2024   Eye exam for diabetics  04/20/2024   Flu Shot  05/21/2024   Yearly kidney health urinalysis for diabetes  07/07/2024   Medicare Annual Wellness Visit  03/04/2025   DTaP/Tdap/Td vaccine (3 - Td or Tdap) 08/01/2031   Pneumonia Vaccine  Completed   HPV Vaccine  Aged Out   Meningitis B Vaccine  Aged Out   COVID-19 Vaccine  Discontinued   Zoster (Shingles) Vaccine  Discontinued    Advanced directives: (In Chart) A copy of your advanced directives are scanned into your chart should your provider ever need it. Advance Care Planning is important because it:  [x]  Makes sure you receive the medical care that is consistent with your values, goals, and preferences  [x]  It provides guidance to your family and loved ones and reduces their  decisional burden about whether or not they are making the right decisions based on your wishes.  Follow the link provided in your after visit summary or read over the paperwork we have mailed to you to help you started getting your Advance Directives in place. If you need assistance in completing these, please reach out to us  so that we can help you!  See attachments for Preventive Care and Fall Prevention Tips.

## 2024-03-04 NOTE — Progress Notes (Signed)
 Subjective:   Bryan Thomas is a 83 y.o. who presents for a Medicare Wellness preventive visit.  As a reminder, Annual Wellness Visits don't include a physical exam, and some assessments may be limited, especially if this visit is performed virtually. We may recommend an in-person visit if needed.  Visit Complete: Virtual I connected with  Bryan Thomas on 03/04/24 by a audio enabled telemedicine application and verified that I am speaking with the correct person using two identifiers.  Patient Location: Home  Provider Location: Home Office  I discussed the limitations of evaluation and management by telemedicine. The patient expressed understanding and agreed to proceed.  Vital Signs: Because this visit was a virtual/telehealth visit, some criteria may be missing or patient reported. Any vitals not documented were not able to be obtained and vitals that have been documented are patient reported.  VideoError- Librarian, academic were attempted between this provider and patient, however failed, due to patient having technical difficulties OR patient did not have access to video capability.  We continued and completed visit with audio only.   Persons Participating in Visit: Patient.  AWV Questionnaire: No: Patient Medicare AWV questionnaire was not completed prior to this visit.  Cardiac Risk Factors include: advanced age (>4men, >58 women);diabetes mellitus;male gender     Objective:     Today's Vitals   There is no height or weight on file to calculate BMI.     03/04/2024    8:14 AM 04/17/2023   11:08 AM 02/11/2023    8:28 AM 01/08/2023   12:53 PM 04/01/2022   11:19 AM 12/10/2021    1:25 PM 10/24/2021    4:31 PM  Advanced Directives  Does Patient Have a Medical Advance Directive? Yes No Yes No Yes No No  Type of Estate agent of [place];Living will  Healthcare Power of [place];Living will  Living will    Does patient  want to make changes to medical advance directive?   No - Patient declined    No - Patient declined  Copy of Healthcare Power of Attorney in Chart? Yes - validated most recent copy scanned in chart (See row information)  Yes - validated most recent copy scanned in chart (See row information)      Would patient like information on creating a medical advance directive?  No - Patient declined    No - Patient declined No - Patient declined    Current Medications (verified) Outpatient Encounter Medications as of 03/04/2024  Medication Sig   aspirin  EC 81 MG tablet Take 1 tablet (81 mg total) by mouth daily. Swallow whole.   clobetasol  cream (TEMOVATE ) 0.05 % Apply 1 Application topically 2 (two) times daily.   atorvastatin  (LIPITOR) 20 MG tablet Take 1 tablet (20 mg total) by mouth daily.   metFORMIN  (GLUCOPHAGE -XR) 750 MG 24 hr tablet Take 1 tablet (750 mg total) by mouth daily with breakfast. (Patient not taking: Reported on 03/04/2024)   tamsulosin  (FLOMAX ) 0.4 MG CAPS capsule Take 1 capsule (0.4 mg total) by mouth daily. (Patient not taking: Reported on 03/04/2024)   No facility-administered encounter medications on file as of 03/04/2024.    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   Procedure: RIGHT TOTAL KNEE ARTHROPLASTY;  Surgeon: Wes Hamman, MD;  Location: [facility] OR;  Service: Orthopedics;  Laterality: Right;   Family History  Problem Relation Age of Onset   Cancer Mother    Breast cancer Mother    Arthritis Mother    Aneurysm Father    Diabetes Sister    Diabetes Brother    Heart disease Brother        CABG   Heart disease Brother    Diabetes Brother    Diabetes Brother    Social  History   Socioeconomic History   Marital status: Widowed    Spouse name: Not on file   Number of children: 4   Years of education: Not on file   Highest education level: Not on file  Occupational History   Occupation: Service work--mechanic    Comment: tired  Tobacco Use   Smoking status: Former    Current packs/day: 0.00    Average packs/day: 1 pack/day for 40.0 years (40.0 ttl pk-yrs)    Types: Cigarettes    Start date: 28    Quit date: 63    Years since quitting: 35.3    Passive exposure: Past   Smokeless tobacco: Former    Types: Chew    Quit date: 08/21/2021  Vaping Use   Vaping status: Never Used  Substance and Sexual Activity   Alcohol use: No    Alcohol/week: 0.0 standard drinks of alcohol   Drug use: Never   Sexual activity: Not on file  Other Topics Concern   Not on file  Social History Narrative   Widowed 1/22      Has living will   Daughter [name] is health care POA   Would accept resuscitation attempts--but no prolonged artificial ventilation   No tube feeds if cognitively unaware   Social Drivers of Health   Financial Resource Strain: Low Risk  (03/04/2024)   Overall Financial Resource Strain (CARDIA)    Difficulty of Paying Living Expenses: Not hard at all  Food Insecurity: No Food Insecurity (03/04/2024)   Hunger Vital Sign    Worried About Running Out of Food in the Last Year: Never true    Ran Out of Food in the Last Year: Never true  Transportation Needs: No Transportation Needs (03/04/2024)   PRAPARE - Administrator, Civil Service (Medical): No    Lack of Transportation (Non-Medical): No  Physical Activity: Inactive (03/04/2024)   Exercise Vital Sign    Days of Exercise per Week: 0 days    Minutes of Exercise per Session: 0 min  Stress: No Stress Concern Present (03/04/2024)   [employer] of Occupational Health - Occupational Stress Questionnaire    Feeling of Stress : Not at all  Social Connections: Moderately Isolated  (03/04/2024)   Social Connection and Isolation Panel [NHANES]    Frequency of Communication with Friends and Family: More than three times a week    Frequency of Social Gatherings with Friends and Family: More than three times a week    Attends Religious Services: More than 4 times per year    Active Member of [employer] or Organizations: No    Attends Banker Meetings: Never    Marital Status: Widowed    Tobacco Counseling Counseling given: Not Answered    Clinical Intake:  Pre-visit preparation completed: Yes  Pain : No/denies pain     Nutritional Risks: None Diabetes: Yes CBG done?: No  Did pt. bring in CBG monitor from home?: No  Lab Results  Component Value Date   HGBA1C 6.5 07/08/2023   HGBA1C 6.7 (A) 04/07/2023   HGBA1C 7.0 (A) 01/15/2023     How often do you need to have someone help you when you read instructions, pamphlets, or other written materials from your doctor or pharmacy?: 1 - Never  Interpreter Needed?: No  Information entered by :: NAllen LPN   Activities of Daily Living     03/04/2024    8:08 AM  In your present state of health, do you have any difficulty performing the following activities:  Hearing? 1  Comment has hearing aids  Vision? 1  Comment vision changes  Difficulty concentrating or making decisions? 0  Walking or climbing stairs? 0  Dressing or bathing? 0  Doing errands, shopping? 0  Preparing Food and eating ? N  Using the Toilet? N  In the past six months, have you accidently leaked urine? N  Do you have problems with loss of bowel control? N  Managing your Medications? N  Managing your Finances? N  Housekeeping or managing your Housekeeping? N    Patient Care Team: Laneta Pintos, MD as PCP - General (Family Medicine) Constancia Delton, MD as PCP - Cardiology (Cardiology)  Indicate any recent Medical Services you may have received from other than [facility] providers in the past year (date may be approximate).      Assessment:    This is a routine wellness examination for [place].  Hearing/Vision screen Hearing Screening - Comments:: Has hearing aids that are maintained Vision Screening - Comments:: Regular eye exams   Goals Addressed             This Visit's Progress    Patient Stated       03/04/2024, denies goals       Depression Screen     03/04/2024    8:15 AM 07/08/2023    9:06 AM 05/12/2023    3:41 PM 04/07/2023    9:19 AM 02/11/2023    8:25 AM 12/04/2022   10:39 AM 03/19/2022    2:59 PM  PHQ 2/9 Scores  PHQ - 2 Score 0 2 5 0 0 2 1  PHQ- 9 Score 1 8 13 5  7      Fall Risk     03/04/2024    8:15 AM 04/07/2023    9:18 AM 02/11/2023    8:27 AM 12/04/2022   10:40 AM 03/19/2022    2:59 PM  Fall Risk   Falls in the past year? 0 0 0 0 0  Number falls in past yr: 0  0 0 0  Injury with Fall? 0  0 0 0  Risk for fall due to : No Fall Risks No Fall Risks No Fall Risks  No Fall Risks  Follow up Falls prevention discussed;Falls evaluation completed Falls evaluation completed Falls prevention discussed  Falls evaluation completed    MEDICARE RISK AT HOME:  Medicare Risk at Home Any stairs in or around the home?: Yes If so, are there any without handrails?: No Home free of loose throw rugs in walkways, pet beds, electrical cords, etc?: Yes Adequate lighting in your home to reduce risk of falls?: Yes Life alert?: No Use of a cane, walker or w/c?: Yes Grab bars in the bathroom?: No Shower chair or bench in shower?: No Elevated toilet seat or a handicapped toilet?: No  TIMED UP AND GO:  Was the test performed?  No  Cognitive Function: 6CIT completed        03/04/2024    8:16 AM 02/11/2023    8:28 AM  6CIT Screen  What Year? 0 points 0 points  What month? 0 points 0 points  What time? 0 points 0 points  Count back from 20 0 points 0 points  Months in reverse 0 points 0 points  Repeat phrase 0 points 0 points  Total Score 0 points 0 points    Immunizations Immunization  History  Administered Date(s) Administered   Pneumococcal Conjugate-13 09/20/2014   Pneumococcal Polysaccharide-23 02/25/2017   Td 09/20/2014   Tdap 07/31/2021    Screening Tests Health Maintenance  Topic Date Due   FOOT EXAM  Never done   HEMOGLOBIN A1C  01/05/2024   Diabetic kidney evaluation - eGFR measurement  04/16/2024   OPHTHALMOLOGY EXAM  04/20/2024   INFLUENZA VACCINE  05/21/2024   Diabetic kidney evaluation - Urine ACR  07/07/2024   Medicare Annual Wellness (AWV)  03/04/2025   DTaP/Tdap/Td (3 - Td or Tdap) 08/01/2031   Pneumonia Vaccine 33+ Years old  Completed   HPV VACCINES  Aged Out   Meningococcal B Vaccine  Aged Out   COVID-19 Vaccine  Discontinued   Zoster Vaccines- Shingrix  Discontinued    Health Maintenance  Health Maintenance Due  Topic Date Due   FOOT EXAM  Never done   HEMOGLOBIN A1C  01/05/2024   Health Maintenance Items Addressed: Declines appointment to have A1C or feet checked.  Additional Screening:  Vision Screening: Recommended annual ophthalmology exams for early detection of glaucoma and other disorders of the eye.  Dental Screening: Recommended annual dental exams for proper oral hygiene  Community Resource Referral / Chronic Care Management: CRR required this visit?  No   CCM required this visit?  No   Plan:    I have personally reviewed and noted the following in the patient's chart:   Medical and social history Use of alcohol, tobacco or illicit drugs  Current medications and supplements including opioid prescriptions. Patient is not currently taking opioid prescriptions. Functional ability and status Nutritional status Physical activity Advanced directives List of other physicians Hospitalizations, surgeries, and ER visits in previous 12 months Vitals Screenings to include cognitive, depression, and falls Referrals and appointments  In addition, I have reviewed and discussed with patient certain preventive  protocols, quality metrics, and best practice recommendations. A written personalized care plan for preventive services as well as general preventive health recommendations were provided to patient.   Areatha Beecham, LPN   [id number]/66/4403   After Visit Summary: (MyChart) Due to this being a telephonic visit, the after visit summary with patients personalized plan was offered to patient via MyChart   Notes: Nothing significant to report at this time.

## 2024-05-10 ENCOUNTER — Telehealth: Payer: Self-pay | Admitting: *Deleted

## 2024-05-10 NOTE — Telephone Encounter (Signed)
 LVM to call office to schedule an appointment, Looks like pt was to follow up in December and that appt did not happen so wanted to see about scheduling an appointment for below from appt in September.    Return in about 3 months (around 10/07/2023) for DM.

## 2024-05-12 NOTE — Telephone Encounter (Signed)
 Contacted pt and scheduled an appointment in August.

## 2024-06-09 DIAGNOSIS — U071 COVID-19: Secondary | ICD-10-CM | POA: Diagnosis not present

## 2024-06-15 ENCOUNTER — Encounter: Payer: Self-pay | Admitting: Family Medicine

## 2024-06-15 ENCOUNTER — Ambulatory Visit (INDEPENDENT_AMBULATORY_CARE_PROVIDER_SITE_OTHER): Admitting: Family Medicine

## 2024-06-15 VITALS — BP 133/71 | HR 70 | Ht 74.0 in | Wt 255.1 lb

## 2024-06-15 DIAGNOSIS — G629 Polyneuropathy, unspecified: Secondary | ICD-10-CM | POA: Insufficient documentation

## 2024-06-15 DIAGNOSIS — E1169 Type 2 diabetes mellitus with other specified complication: Secondary | ICD-10-CM | POA: Diagnosis not present

## 2024-06-15 DIAGNOSIS — E785 Hyperlipidemia, unspecified: Secondary | ICD-10-CM | POA: Diagnosis not present

## 2024-06-15 DIAGNOSIS — E1165 Type 2 diabetes mellitus with hyperglycemia: Secondary | ICD-10-CM

## 2024-06-15 DIAGNOSIS — D696 Thrombocytopenia, unspecified: Secondary | ICD-10-CM | POA: Diagnosis not present

## 2024-06-15 LAB — POCT GLYCOSYLATED HEMOGLOBIN (HGB A1C): HbA1c POC (<> result, manual entry): 6.6 % (ref 4.0–5.6)

## 2024-06-15 NOTE — Assessment & Plan Note (Signed)
 Currently only taking berberine.  A1c 6.6%.  getting uacr today, cmp.  F/u 6 months.

## 2024-06-15 NOTE — Patient Instructions (Signed)
 It was nice to see you today,  We addressed the following topics today: -Your A1c was 6.6.  This is good.  Keep taking your berberine. - I will get some additional testing including B12, CBC to reevaluate your platelets, and kidney and liver function tests. 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  If this ever gets worse to the point where you would like to treat it, I would recommend using Qutenza patches on your feet. - You may also have some sciatic pain.  If your low back pain and pain radiating down your legs does not get better after 6 weeks let us  know.  I can refer you to an orthopedist who may be able to do local injections if appropriate.  You should not take oral steroids due to your diabetes.  Have a great day,  Rolan Slain, MD

## 2024-06-15 NOTE — Progress Notes (Unsigned)
 Established Patient Office Visit  Subjective   Patient ID: Bryan Thomas, male    DOB: [birth year]  Age: 83 y.o. MRN: [id number]  Chief Complaint  Patient presents with   Medical Management of Chronic Issues    HPI  Subjective - Recent COVID-19 infection while on a cruise. Completed a course of Paxlovid yesterday. Reports resolution of symptoms.  - Bilateral leg pain from hips to feet and foot tingling. Started with recent COVID-19 illness. Describes tingling in feet, not a pins-and-needles sensation. Pain is present with walking.  - History of dizziness and headaches, which are now much improved.  Medications: Finasteride daily, Aspirin  (taken intermittently), Berberine, Vitamin K, Vitamin B12. Previously stopped metformin  and Lipitor due to side effects including nausea and dizziness. Not currently taking tamsulosin  or alfuzosin.  PMH, PSH, FH, Social Hx: PMHx: Diabetes mellitus type 2, benign prostatic hyperplasia, hyperlipidemia, history of dizziness and headaches. Recent COVID-19 infection. PSH: None mentioned. FH: None mentioned. Social Hx: Recently returned from a cruise to [place] . Sees a urologist and a homeopathic MD (Dr. Dorn Pouch).  ROS: Constitutional: No current fever, chills, or other active COVID-19 symptoms. Neurologic: Reports foot tingling and leg pain. Denies headaches or dizziness currently. Musculoskeletal: Reports bilateral leg pain.   The ASCVD Risk score (Arnett DK, et al., 2019) failed to calculate for the following reasons:   The 2019 ASCVD risk score is only valid for ages 30 to 37  Health Maintenance Due  Topic Date Due   Diabetic kidney evaluation - eGFR measurement  04/16/2024   OPHTHALMOLOGY EXAM  04/20/2024   INFLUENZA VACCINE  05/21/2024   Diabetic kidney evaluation - Urine ACR  07/07/2024      Objective:     BP 133/71   Pulse 70   Ht 6' 2 (1.88 m)   Wt 255 lb 1.9 oz (115.7 kg)   SpO2 93%   BMI 32.76 kg/m  {Vitals  History (Optional):23777}  Physical Exam Gen: alert, oriented. Accompanied by daughter Pulm: no resp distress. No cough.  Ext: dp pulse equal b/l.  Sensation dulled b/l.  Sensation to monofilament spared in the great toes b/l.    Results for orders placed or performed in visit on 06/15/24  POCT glycosylated hemoglobin (Hb A1C)  Result Value Ref Range   Hemoglobin A1C     HbA1c POC (<> result, manual entry) 6.6 4.0 - 5.6 %   HbA1c, POC (prediabetic range)     HbA1c, POC (controlled diabetic range)          Assessment & Plan:   Type 2 diabetes mellitus with hyperglycemia, without long-term current use of insulin (HCC) Assessment & Plan: Currently only taking berberine.  A1c 6.6%.  getting uacr today, cmp.  F/u 6 months.   Orders: -     POCT glycosylated hemoglobin (Hb A1C) -     Microalbumin / creatinine urine ratio -     Comprehensive metabolic panel with GFR  Hyperlipidemia associated with type 2 diabetes mellitus (HCC) -     Lipid panel -     Comprehensive metabolic panel with GFR  Low platelet count (HCC) Assessment & Plan: Recently diagnosed with COVID-19 infection during a cruise and completed a 5-day course of Paxlovid, which finished on 06/14/2024. Reports resolution of symptoms. Labs from the cruise ship showed transiently low platelets (70) which subsequently normalized (188). BNP was normal. - Plan to recheck platelets with today's labs to ensure they remain in the normal range.  Orders: -  CBC  Neuropathy Assessment & Plan: Reports leg pain from the hips down to the feet, worse with walking, which also began with the recent COVID-19 infection. This is suggestive of sciatica, possibly exacerbated by the recent illness. Also has evidence of diabetic neuropathy on exam.   - Advised to avoid strenuous activity, heavy lifting, and twisting motions. - Short-term use of NSAIDs like ibuprofen is acceptable if needed. - not a good candidate for oral steroids d/t  diabetes.  - If pain does not improve in 4 weeks, will consider imaging such as an X-ray or referral to orthopedics. Follow up sooner if symptoms worsen. - Labs today to include B12 level, CBC, CMP, and a urine microalbumin. - Discussed management options if symptoms worsen, including gabapentin/pregabalin or a capsaicin patch, though not indicated at this time.  Orders: -     B12 and Folate Panel     Return in about 6 months (around 12/16/2024) for DM, hld.    Toribio MARLA Slain, MD

## 2024-06-15 NOTE — Assessment & Plan Note (Signed)
 Recently diagnosed with COVID-19 infection during a cruise and completed a 5-day course of Paxlovid, which finished on 06/14/2024. Reports resolution of symptoms. Labs from the cruise ship showed transiently low platelets (70) which subsequently normalized (188). BNP was normal. - Plan to recheck platelets with today's labs to ensure they remain in the normal range.

## 2024-06-15 NOTE — Assessment & Plan Note (Addendum)
 Reports leg pain from the hips down to the feet, worse with walking, which also began with the recent COVID-19 infection. This is suggestive of sciatica, possibly exacerbated by the recent illness. Also has evidence of diabetic neuropathy on exam.   - Advised to avoid strenuous activity, heavy lifting, and twisting motions. - Short-term use of NSAIDs like ibuprofen is acceptable if needed. - not a good candidate for oral steroids d/t diabetes.  - If pain does not improve in 4 weeks, will consider imaging such as an X-ray or referral to orthopedics. Follow up sooner if symptoms worsen. - Labs today to include B12 level, CBC, CMP, and a urine microalbumin. - Discussed management options if symptoms worsen, including gabapentin/pregabalin or a capsaicin patch, though not indicated at this time.

## 2024-06-16 ENCOUNTER — Other Ambulatory Visit: Payer: Self-pay | Admitting: Family Medicine

## 2024-06-16 ENCOUNTER — Ambulatory Visit: Payer: Self-pay | Admitting: Family Medicine

## 2024-06-16 DIAGNOSIS — D72829 Elevated white blood cell count, unspecified: Secondary | ICD-10-CM

## 2024-06-16 LAB — CBC
Hematocrit: 46.5 % (ref 37.5–51.0)
Hemoglobin: 15.5 g/dL (ref 13.0–17.7)
MCH: 31.8 pg (ref 26.6–33.0)
MCHC: 33.3 g/dL (ref 31.5–35.7)
MCV: 96 fL (ref 79–97)
Platelets: 270 x10E3/uL (ref 150–450)
RBC: 4.87 x10E6/uL (ref 4.14–5.80)
RDW: 12.7 % (ref 11.6–15.4)
WBC: 14.8 x10E3/uL — ABNORMAL HIGH (ref 3.4–10.8)

## 2024-06-16 LAB — COMPREHENSIVE METABOLIC PANEL WITH GFR
ALT: 24 IU/L (ref 0–44)
AST: 21 IU/L (ref 0–40)
Albumin: 4.4 g/dL (ref 3.7–4.7)
Alkaline Phosphatase: 136 IU/L — ABNORMAL HIGH (ref 44–121)
BUN/Creatinine Ratio: 17 (ref 10–24)
BUN: 14 mg/dL (ref 8–27)
Bilirubin Total: 0.5 mg/dL (ref 0.0–1.2)
CO2: 21 mmol/L (ref 20–29)
Calcium: 9.5 mg/dL (ref 8.6–10.2)
Chloride: 101 mmol/L (ref 96–106)
Creatinine, Ser: 0.83 mg/dL (ref 0.76–1.27)
Globulin, Total: 2.6 g/dL (ref 1.5–4.5)
Glucose: 182 mg/dL — ABNORMAL HIGH (ref 70–99)
Potassium: 4.7 mmol/L (ref 3.5–5.2)
Sodium: 139 mmol/L (ref 134–144)
Total Protein: 7 g/dL (ref 6.0–8.5)
eGFR: 86 mL/min/1.73 (ref >59)

## 2024-06-16 LAB — LIPID PANEL
Chol/HDL Ratio: 6.4 ratio — ABNORMAL HIGH (ref 0.0–5.0)
Cholesterol, Total: 166 mg/dL (ref 100–199)
HDL: 26 mg/dL — ABNORMAL LOW (ref >39)
LDL Chol Calc (NIH): 69 mg/dL (ref 0–99)
Triglycerides: 457 mg/dL — ABNORMAL HIGH (ref 0–149)
VLDL Cholesterol Cal: 71 mg/dL — ABNORMAL HIGH (ref 5–40)

## 2024-06-16 LAB — B12 AND FOLATE PANEL
Folate: >20 ng/mL (ref >3.0)
Vitamin B-12: 843 pg/mL (ref 232–1245)

## 2024-06-16 LAB — MICROALBUMIN / CREATININE URINE RATIO
Creatinine, Urine: 165.4 mg/dL
Microalb/Creat Ratio: 14 mg/g{creat} (ref 0–29)
Microalbumin, Urine: 22.5 ug/mL

## 2024-06-18 ENCOUNTER — Other Ambulatory Visit: Payer: Self-pay | Admitting: Family Medicine

## 2024-06-18 DIAGNOSIS — D72820 Lymphocytosis (symptomatic): Secondary | ICD-10-CM

## 2024-06-18 LAB — CBC WITH DIFFERENTIAL/PLATELET
Basophils Absolute: 0 x10E3/uL (ref 0.0–0.2)
Basos: 0 %
EOS (ABSOLUTE): 0.5 x10E3/uL — ABNORMAL HIGH (ref 0.0–0.4)
Eos: 3 %
Hematocrit: 46.2 % (ref 37.5–51.0)
Hemoglobin: 15.7 g/dL (ref 13.0–17.7)
Immature Grans (Abs): 0 x10E3/uL (ref 0.0–0.1)
Immature Granulocytes: 0 %
Lymphocytes Absolute: 7.8 x10E3/uL — ABNORMAL HIGH (ref 0.7–3.1)
Lymphs: 53 %
MCH: 32.4 pg (ref 26.6–33.0)
MCHC: 34 g/dL (ref 31.5–35.7)
MCV: 96 fL (ref 79–97)
Monocytes Absolute: 0.9 x10E3/uL (ref 0.1–0.9)
Monocytes: 6 %
Neutrophils Absolute: 5.7 x10E3/uL (ref 1.4–7.0)
Neutrophils: 38 %
Platelets: 277 x10E3/uL (ref 150–450)
RBC: 4.84 x10E6/uL (ref 4.14–5.80)
RDW: 12.6 % (ref 11.6–15.4)
WBC: 14.9 x10E3/uL — ABNORMAL HIGH (ref 3.4–10.8)

## 2024-06-18 LAB — SPECIMEN STATUS REPORT

## 2024-06-22 DIAGNOSIS — D72820 Lymphocytosis (symptomatic): Secondary | ICD-10-CM | POA: Diagnosis not present

## 2024-06-22 NOTE — Telephone Encounter (Signed)
 Patient has been scheduled for labs tomorrow 06/23/24.

## 2024-06-23 ENCOUNTER — Other Ambulatory Visit

## 2024-06-23 DIAGNOSIS — D72820 Lymphocytosis (symptomatic): Secondary | ICD-10-CM

## 2024-06-24 ENCOUNTER — Encounter: Payer: Self-pay | Admitting: Family Medicine

## 2024-06-24 ENCOUNTER — Ambulatory Visit: Admitting: Family Medicine

## 2024-06-24 ENCOUNTER — Ambulatory Visit: Payer: Self-pay | Admitting: Family Medicine

## 2024-06-24 ENCOUNTER — Ambulatory Visit: Payer: Self-pay

## 2024-06-24 ENCOUNTER — Ambulatory Visit (INDEPENDENT_AMBULATORY_CARE_PROVIDER_SITE_OTHER): Admitting: Family Medicine

## 2024-06-24 ENCOUNTER — Ambulatory Visit (HOSPITAL_BASED_OUTPATIENT_CLINIC_OR_DEPARTMENT_OTHER)
Admission: RE | Admit: 2024-06-24 | Discharge: 2024-06-24 | Disposition: A | Discharge status: Home / Self Care | Source: Ambulatory Visit | Attending: Family Medicine | Admitting: Family Medicine

## 2024-06-24 VITALS — BP 110/68 | HR 83 | Temp 97.9°F | Resp 24 | Ht 74.0 in | Wt 257.0 lb

## 2024-06-24 DIAGNOSIS — R0602 Shortness of breath: Secondary | ICD-10-CM | POA: Diagnosis not present

## 2024-06-24 DIAGNOSIS — U071 COVID-19: Secondary | ICD-10-CM | POA: Insufficient documentation

## 2024-06-24 DIAGNOSIS — D72829 Elevated white blood cell count, unspecified: Secondary | ICD-10-CM | POA: Diagnosis not present

## 2024-06-24 DIAGNOSIS — J1282 Pneumonia due to coronavirus disease 2019: Secondary | ICD-10-CM | POA: Diagnosis not present

## 2024-06-24 MED ORDER — AMOXICILLIN-POT CLAVULANATE 875-125 MG PO TABS
1.0000 | ORAL_TABLET | Freq: Two times a day (BID) | ORAL | 0 refills | Status: AC
Start: 2024-06-24 — End: ?

## 2024-06-24 NOTE — Telephone Encounter (Signed)
 Pt scheduled at alternate office d/t no availability in primary office.  FYI Only or Action Required?: FYI only for provider.  Patient was last seen in primary care on 06/15/2024 by Chandra Toribio POUR, MD.  Called Nurse Triage reporting Hypertension.  Symptoms began several days ago.  Interventions attempted: Rest, hydration, or home remedies.  Symptoms are: gradually worsening.  Triage Disposition: See HCP Within 4 Hours (Or PCP Triage)  Patient/caregiver understands and will follow disposition?: Yes Reason for Disposition  [1] MODERATE weakness (e.g., interferes with work, school, normal activities) AND [2] cause unknown  (Exceptions: Weakness from acute minor illness or poor fluid intake; weakness is chronic and not worse.)  Answer Assessment - Initial Assessment Questions BLOOD PRESSURE: What is your blood pressure? Did you take at least two measurements 5 minutes apart?     Yesterday and today: 146/82, 158/95, 153/87  ONSET: When did you take your blood pressure?     Nancye  MEDICINES: Are you taking any medicines for blood pressure? Have you missed any doses recently?     No  OTHER SYMPTOMS: Do you have any symptoms? (e.g., blurred vision, chest pain, difficulty breathing, headache, weakness)     Malaise and excessive sweating  Answer Assessment - Initial Assessment Questions DESCRIPTION: Describe how you are feeling.     Malaise, feels like he can't get up and do anything  ONSET: When did these symptoms begin? (e.g., hours, days, weeks, months)     3 days  CAUSE: What do you think is causing the weakness or fatigue? (e.g., not drinking enough fluids, medical problem, trouble sleeping)     Unknown, but recently had covid (Aug 20)  NEW MEDICINES:  Have you started on any new medicines recently? (e.g., opioid pain medicines, benzodiazepines, muscle relaxants, antidepressants, antihistamines, neuroleptics, beta blockers)     Only takes aspirin . Did take  Paxlovid for covid.  Protocols used: Blood Pressure - High-A-AH, Weakness (Generalized) and Fatigue-A-AH Copied from CRM #[id number]. Topic: Clinical - Red Word Triage >> Jun 24, 2024  9:03 AM Leonette P wrote: Red Word that prompted transfer to Nurse Triage: Pt has been having elevated blood pressure rapid pulse also not feeling well

## 2024-06-24 NOTE — Assessment & Plan Note (Signed)
 Elevated WBC, suspected infection Recent COVID 19 infection from several weeks ago on a cruise, was given Paxlovid and decongestant on the cruise.  - Augmentin  TWICE A DAY for 10 days sent to pharmacy - STAT chest xray ordered to confirm possible pneumonia

## 2024-06-24 NOTE — Assessment & Plan Note (Signed)
 Shortness of breath, night sweats, elevated WBC (14.9), and wheezing suggest pneumonia. Recent COVID-19 may have led to secondary bacterial infection. Differential includes walking pneumonia. - Order chest x-[facility] at [facility]. - Prescribe Augmentin  BID for 10 days. - If x-ray confirms pneumonia, prescribe azithromycin : 2 pills on day 1, then 1 pill daily for 4 days.

## 2024-06-24 NOTE — Progress Notes (Signed)
 Acute Office Visit  Subjective:    Patient ID: Bryan Thomas, male    DOB: 03-21-1941, 83 y.o.   MRN: [id number]  Chief Complaint  Patient presents with   Fatigue    X two or three weeks    Discussed the use of AI scribe software for clinical note transcription with the patient, who gave verbal consent to proceed.  History of Present Illness   Bryan Thomas is an 83 year old male who presents with shortness of breath and possible pneumonia.  He has experienced worsening shortness of breath over the past week, becoming easily fatigued with minimal exertion, such as walking a short distance. 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 He also reports night sweats, feeling damp even on mild days, and a whistling noise when breathing.  He recently recovered from COVID-19, treated with Paxlovid, with initial improvement in symptoms. However, his condition has since deteriorated. During his COVID-19 illness, he had a fever that resolved within 24 hours. He denies recent chest pain but has a persistent dull headache behind his eyes, present even before the COVID-19 infection.  He has a history of high blood pressure readings (140's- 160's/80's - 90's), with a recent low readings around 90/58 in the office. Denies taking over-the-counter medications except for a daily baby aspirin . He completed a course of decongestants during a recent cruise.   No gastrointestinal symptoms such as nausea, vomiting, diarrhea, or constipation. His white blood cell count was elevated at 14.6 during a recent office visit.       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   Procedure: RIGHT TOTAL KNEE ARTHROPLASTY;   Surgeon: Jerri Kay HERO, MD;  Location: [facility] OR;  Service: Orthopedics;  Laterality: Right;    Family History  Problem Relation Age of Onset   Cancer Mother    Breast cancer Mother    Arthritis Mother    Aneurysm Father    Diabetes Sister    Diabetes Brother    Heart disease Brother        CABG   Heart disease Brother    Diabetes Brother    Diabetes Brother     Social History   Socioeconomic History   Marital status: Widowed    Spouse name: Not on file   Number of children: 4   Years of education: Not on file   Highest education level: Not on file  Occupational History   Occupation: Service work--mechanic    Comment: tired  Tobacco Use   Smoking status: Former    Current packs/day: 0.00    Average packs/day: 1 pack/day for 40.0 years (40.0 ttl pk-yrs)    Types: Cigarettes    Start date: 27    Quit date: 77    Years since quitting: 35.6    Passive exposure: Past   Smokeless tobacco: Former    Types: Chew    Quit date: 08/21/2021  Vaping Use   Vaping status: Never Used  Substance and Sexual Activity   Alcohol use: No    Alcohol/week: 0.0 standard drinks of alcohol   Drug use: Never   Sexual activity: Not on file  Other Topics  Concern   Not on file  Social History Narrative   Widowed 1/22      Has living will   Daughter Leita is health care POA   Would accept resuscitation attempts--but no prolonged artificial ventilation   No tube feeds if cognitively unaware   Social Drivers of Health   Financial Resource Strain: Low Risk  (03/04/2024)   Overall Financial Resource Strain (CARDIA)    Difficulty of Paying Living Expenses: Not hard at all  Food Insecurity: No Food Insecurity (03/04/2024)   Hunger Vital Sign    Worried About Running Out of Food in the Last Year: Never true    Ran Out of Food in the Last Year: Never true  Transportation Needs: No Transportation Needs (03/04/2024)   PRAPARE - Administrator, Civil Service (Medical): No    Lack of  Transportation (Non-Medical): No  Physical Activity: Inactive (03/04/2024)   Exercise Vital Sign    Days of Exercise per Week: 0 days    Minutes of Exercise per Session: 0 min  Stress: No Stress Concern Present (03/04/2024)   [employer] of Occupational Health - Occupational Stress Questionnaire    Feeling of Stress : Not at all  Social Connections: Moderately Isolated (03/04/2024)   Social Connection and Isolation Panel    Frequency of Communication with Friends and Family: More than three times a week    Frequency of Social Gatherings with Friends and Family: More than three times a week    Attends Religious Services: More than 4 times per year    Active Member of [employer] or Organizations: No    Attends Banker Meetings: Never    Marital Status: Widowed  Intimate Partner Violence: Not At Risk (03/04/2024)   Humiliation, Afraid, Rape, and Kick questionnaire    Fear of Current or Ex-Partner: No    Emotionally Abused: No    Physically Abused: No    Sexually Abused: No    Outpatient Medications Prior to Visit  Medication Sig Dispense Refill   aspirin  EC 81 MG tablet Take 1 tablet (81 mg total) by mouth daily. Swallow whole. 90 tablet 3   No facility-administered medications prior to visit.    Allergies  Allergen Reactions   Oxycodone      Ants crawling all over me    Review of Systems  Constitutional:  Positive for diaphoresis and fatigue. Negative for appetite change and fever.  HENT:  Negative for congestion, ear pain, sinus pressure and sore throat.   Eyes: Negative.   Respiratory:  Positive for shortness of breath and wheezing. Negative for cough and chest tightness.   Cardiovascular:  Negative for chest pain and palpitations.  Gastrointestinal:  Negative for abdominal pain, constipation, diarrhea, nausea and vomiting.  Endocrine: Negative.   Genitourinary:  Negative for dysuria, frequency, hematuria and urgency.  Musculoskeletal:  Negative for  arthralgias, back pain, joint swelling and myalgias.  Skin:  Negative for rash.  Allergic/Immunologic: Negative.   Neurological:  Positive for dizziness and headaches. Negative for weakness and light-headedness.  Hematological: Negative.   Psychiatric/Behavioral:  Negative for dysphoric mood. The patient is not nervous/anxious.        Objective:        06/24/2024    3:49 PM 06/24/2024    3:19 PM 06/15/2024    8:55 AM  Vitals with BMI  Height  6' 2 6' 2  Weight  257 lbs 255 lbs 2 oz  BMI  32.98 32.74  Systolic 110 98  133  Diastolic 68 60 71  Pulse  83 70    Orthostatic VS for the past 72 hrs (Last 3 readings):  Patient Position BP Location Cuff Size  06/24/24 1549 Sitting Left Arm Large     Physical Exam Vitals reviewed.  Constitutional:      General: He is not in acute distress.    Appearance: Normal appearance. He is obese. He is ill-appearing.  Eyes:     Conjunctiva/sclera: Conjunctivae normal.  Cardiovascular:     Rate and Rhythm: Normal rate and regular rhythm.     Heart sounds: Normal heart sounds. No murmur heard. Pulmonary:     Effort: Accessory muscle usage present.     Breath sounds: Examination of the right-upper field reveals wheezing. Examination of the left-upper field reveals wheezing. Examination of the right-lower field reveals decreased breath sounds. Examination of the left-lower field reveals decreased breath sounds. Decreased breath sounds and wheezing present.  Abdominal:     Palpations: Abdomen is soft.  Skin:    General: Skin is warm.  Neurological:     Mental Status: He is alert. Mental status is at baseline.  Psychiatric:        Mood and Affect: Mood normal.        Behavior: Behavior normal.     Health Maintenance Due  Topic Date Due   OPHTHALMOLOGY EXAM  04/20/2024   INFLUENZA VACCINE  Never done    There are no preventive care reminders to display for this patient.   Lab Results  Component Value Date   TSH 1.304 04/17/2023    Lab Results  Component Value Date   WBC 14.8 (H) 06/15/2024   WBC 14.9 (H) 06/15/2024   HGB 15.5 06/15/2024   HGB 15.7 06/15/2024   HCT 46.5 06/15/2024   HCT 46.2 06/15/2024   MCV 96 06/15/2024   MCV 96 06/15/2024   PLT 270 06/15/2024   PLT 277 06/15/2024   Lab Results  Component Value Date   NA 139 06/15/2024   K 4.7 06/15/2024   CO2 21 06/15/2024   GLUCOSE 182 (H) 06/15/2024   BUN 14 06/15/2024   CREATININE 0.83 06/15/2024   BILITOT 0.5 06/15/2024   ALKPHOS 136 (H) 06/15/2024   AST 21 06/15/2024   ALT 24 06/15/2024   PROT 7.0 06/15/2024   ALBUMIN 4.4 06/15/2024   CALCIUM  9.5 06/15/2024   ANIONGAP 7 04/17/2023   EGFR 86 06/15/2024   GFR 80.13 03/23/2021   Lab Results  Component Value Date   CHOL 166 06/15/2024   Lab Results  Component Value Date   HDL 26 (L) 06/15/2024   Lab Results  Component Value Date   LDLCALC 69 06/15/2024   Lab Results  Component Value Date   TRIG 457 (H) 06/15/2024   Lab Results  Component Value Date   CHOLHDL 6.4 (H) 06/15/2024   Lab Results  Component Value Date   HGBA1C 6.6 06/15/2024       Assessment & Plan:  Shortness of breath Assessment & Plan: Shortness of breath, night sweats, elevated WBC (14.9), and wheezing suggest pneumonia. Recent COVID-19 may have led to secondary bacterial infection. Differential includes walking pneumonia. - Order chest x-ray at [facility]. - Prescribe Augmentin  BID for 10 days. - If x-ray confirms pneumonia, prescribe azithromycin : 2 pills on day 1, then 1 pill daily for 4 days.  Orders: -     DG Chest 2 View; Future -     Amoxicillin -Pot Clavulanate; Take 1 tablet by  mouth 2 (two) times daily.  Dispense: 20 tablet; Refill: 0  Leukocytosis, unspecified type Assessment & Plan: Elevated WBC, suspected infection Recent COVID 19 infection from several weeks ago on a cruise, was given Paxlovid and decongestant on the cruise.  - Augmentin  TWICE A DAY for 10 days sent to  pharmacy - STAT chest xray ordered to confirm possible pneumonia  Orders: -     Amoxicillin -Pot Clavulanate; Take 1 tablet by mouth 2 (two) times daily.  Dispense: 20 tablet; Refill: 0  Pneumonia due to COVID-19 virus Assessment & Plan: Acute, onset 4-5 days ago.  Shortness of breath, night sweats, elevated WBC (14.9), and wheezing suggest pneumonia. Recent COVID-19 may have led to secondary bacterial infection. Differential includes walking pneumonia. - Order chest x-ray at [facility]. - Prescribe Augmentin  BID for 10 days. - If x-ray confirms pneumonia, prescribe azithromycin : 2 pills on day 1, then 1 pill daily for 4 days.      Meds ordered this encounter  Medications   amoxicillin -clavulanate (AUGMENTIN ) 875-125 MG tablet    Sig: Take 1 tablet by mouth 2 (two) times daily.    Dispense:  20 tablet    Refill:  0    Orders Placed This Encounter  Procedures   DG Chest 2 View      Follow-up: Return if symptoms worsen or fail to improve.  An After Visit Summary was printed and given to the patient.  Harrie Cedar, FNP [facility] [phone]

## 2024-06-24 NOTE — Assessment & Plan Note (Signed)
 Acute, onset 4-5 days ago.  Shortness of breath, night sweats, elevated WBC (14.9), and wheezing suggest pneumonia. Recent COVID-19 may have led to secondary bacterial infection. Differential includes walking pneumonia. - Order chest x-ray at [facility]. - Prescribe Augmentin  BID for 10 days. - If x-ray confirms pneumonia, prescribe azithromycin : 2 pills on day 1, then 1 pill daily for 4 days.

## 2024-06-28 LAB — SPECIMEN STATUS REPORT

## 2024-06-30 ENCOUNTER — Telehealth: Payer: Self-pay | Admitting: *Deleted

## 2024-06-30 LAB — COMP PANEL: LEUKEMIA/LYMPHOMA

## 2024-06-30 NOTE — Telephone Encounter (Signed)
 Contacted [facility] and LVM informing them that we could not find another order per the provider so that it was ok to discard the sample.

## 2024-06-30 NOTE — Telephone Encounter (Signed)
 Copied from CRM #[id number]. Topic: Clinical - Medication Question >> Jun 30, 2024 10:58 AM Tinnie BROCKS wrote: Reason for CRM: Janelle from [facility] has a tube that she does not have a requisition for for this pt. Requesting to speak with Dr. Adine nurse. Ey#[id number]

## 2024-07-01 ENCOUNTER — Ambulatory Visit: Payer: Self-pay | Admitting: Family Medicine

## 2024-07-01 ENCOUNTER — Other Ambulatory Visit: Payer: Self-pay | Admitting: Family Medicine

## 2024-07-01 DIAGNOSIS — C9110 Chronic lymphocytic leukemia of B-cell type not having achieved remission: Secondary | ICD-10-CM

## 2024-07-06 DIAGNOSIS — R972 Elevated prostate specific antigen [PSA]: Secondary | ICD-10-CM | POA: Diagnosis not present

## 2024-07-13 ENCOUNTER — Encounter: Payer: Self-pay | Admitting: Oncology

## 2024-07-13 ENCOUNTER — Inpatient Hospital Stay

## 2024-07-13 ENCOUNTER — Inpatient Hospital Stay: Attending: Oncology | Admitting: Oncology

## 2024-07-13 VITALS — BP 147/73 | HR 63 | Temp 97.9°F | Resp 18 | Wt 259.1 lb

## 2024-07-13 DIAGNOSIS — R3912 Poor urinary stream: Secondary | ICD-10-CM | POA: Diagnosis not present

## 2024-07-13 DIAGNOSIS — C9110 Chronic lymphocytic leukemia of B-cell type not having achieved remission: Secondary | ICD-10-CM | POA: Diagnosis not present

## 2024-07-13 DIAGNOSIS — Z8616 Personal history of COVID-19: Secondary | ICD-10-CM | POA: Insufficient documentation

## 2024-07-13 DIAGNOSIS — R351 Nocturia: Secondary | ICD-10-CM | POA: Diagnosis not present

## 2024-07-13 DIAGNOSIS — Z87891 Personal history of nicotine dependence: Secondary | ICD-10-CM | POA: Insufficient documentation

## 2024-07-13 DIAGNOSIS — R972 Elevated prostate specific antigen [PSA]: Secondary | ICD-10-CM | POA: Diagnosis not present

## 2024-07-13 DIAGNOSIS — N401 Enlarged prostate with lower urinary tract symptoms: Secondary | ICD-10-CM | POA: Diagnosis not present

## 2024-07-13 LAB — CBC WITH DIFFERENTIAL/PLATELET
Abs Immature Granulocytes: 0.06 K/uL (ref 0.00–0.07)
Basophils Absolute: 0.1 K/uL (ref 0.0–0.1)
Basophils Relative: 0 %
Eosinophils Absolute: 0.6 K/uL — ABNORMAL HIGH (ref 0.0–0.5)
Eosinophils Relative: 4 %
HCT: 44.4 % (ref 39.0–52.0)
Hemoglobin: 14.9 g/dL (ref 13.0–17.0)
Immature Granulocytes: 0 %
Lymphocytes Relative: 53 %
Lymphs Abs: 7.3 K/uL — ABNORMAL HIGH (ref 0.7–4.0)
MCH: 31.5 pg (ref 26.0–34.0)
MCHC: 33.6 g/dL (ref 30.0–36.0)
MCV: 93.9 fL (ref 80.0–100.0)
Monocytes Absolute: 0.8 K/uL (ref 0.1–1.0)
Monocytes Relative: 5 %
Neutro Abs: 5.3 K/uL (ref 1.7–7.7)
Neutrophils Relative %: 38 %
Platelets: 230 K/uL (ref 150–400)
RBC: 4.73 MIL/uL (ref 4.22–5.81)
RDW: 13 % (ref 11.5–15.5)
WBC: 14.1 K/uL — ABNORMAL HIGH (ref 4.0–10.5)
nRBC: 0 % (ref 0.0–0.2)

## 2024-07-13 LAB — LACTATE DEHYDROGENASE: LDH: 137 U/L (ref 98–192)

## 2024-07-13 NOTE — Progress Notes (Signed)
 Hematology/Oncology Progress note [phone] [phone]     REFERRING PROVIDER: Chandra Toribio POUR, MD   Patient Care Team: Bryan Toribio POUR, MD as PCP - General (Family Medicine) Bryan Rogue, MD as PCP - Cardiology (Cardiology)  ASSESSMENT & PLAN:  CLL (chronic lymphocytic leukemia) (HCC) Diagnosis of CLL was reviewed and discussed with patient Currently he has night sweats, no unintentional weight loss or fever. Total white count 14.8.  I recommend watchful waiting.  Obtain ultrasound abdomen for further evaluation.  Night sweat could be secondary to COVID-19 infection.  If symptom is persistent, could consider trigger treatments. Check IgHV and CLL FISH panel.   Orders Placed This Encounter  Procedures   US  Abdomen Complete    Standing Status:   Future    Expected Date:   07/20/2024    Expiration Date:   07/13/2025    Reason for Exam (SYMPTOM  OR DIAGNOSIS REQUIRED):   CLL    Preferred imaging location?:   [facility]   Miscellaneous [facility] test (send-out)    Standing Status:   Future    Number of Occurrences:   1    Expected Date:   07/13/2024    Expiration Date:   10/11/2024    Test name / description::   IGVH somatic hypermutation [facility] [id number]   FISH HES [id number] REA    Standing Status:   Future    Number of Occurrences:   1    Expected Date:   07/13/2024    Expiration Date:   10/11/2024   CBC with Differential/Platelet    Standing Status:   Future    Number of Occurrences:   1    Expected Date:   07/13/2024    Expiration Date:   10/11/2024   Lactate dehydrogenase    Standing Status:   Future    Number of Occurrences:   1    Expected Date:   07/13/2024    Expiration Date:   10/11/2024    All questions were answered. The patient knows to call the clinic with any problems, questions or concerns. No barriers to learning was detected. If above work-up results are negative, he does not need to follow-up with hematology  clinic.  The total time spent in the appointment was 25 minutes encounter with patients including review of chart and various tests results, discussions about plan of care and coordination of care plan  Bryan Cap, MD 07/13/2024   CHIEF COMPLAINTS/PURPOSE OF CONSULTATION:  Abnormal CBC  HISTORY OF PRESENTING ILLNESS:  Bryan Thomas 83 y.o. male presents to establish care for evaluation of abnormal CBC.  03/19/2022, CBC showed a WBC of 10.9.  Patient recalls having tooth extraction prior to the blood work.  Repeat CBC on 03/28/2022 showed WBC normalization to 8.3, no differential was checked. Reviewed patient's previous labs, patient has met elevated white count on 09/18/2021, 10/24/2021.  Reports that he has had knee surgery in November. He denies any recent upper respiratory infections, steroid use.  Denies unintentional weight loss, night sweats, fever.  Appetite is fair.   He was accompanied by his daughter-in-law. + Chronic cough for the past couple of months .  Former smoker, 40-pack-year smoking history, quit in 1990s. 10/24/2021, CT angio chest abdomen pelvis for dissection with and without contrast showed nonspecific mediastinal lymphadenopathy 03/23/2022, chest x-ray showed no acute process.   INTERVAL HISTORY Bryan Thomas is a 83 y.o. male who has above history reviewed by me today presents for follow up visit for  CLL. Patient was seen by me in June 2023 for leukocytosis.  Repeat CBC showed normalization of white count.  It was recommended to repeat blood work in 4 to 6 weeks which he lost to follow-up.  He also had mediastinal lymphadenopathy at that time.  Nonspecific findings.  Patient's total white count persistently increased. 06/15/2024, WBC 14.9, denies increased absolute lymphocyte of 7.8.  Increased eosinophil 0.5. 06/22/2024, peripheral blood flow cytometry showed CLL, clonal B cells representing 34% of leukocytes, positive for CD20, CD22 and CD19, negative for  CD38.  Patient was accompanied by daughter today.  He presents to establish care. He denies any unintentional weight loss.  He had COVID 90 infection 3 weeks ago and after that he has had a night sweats almost every day.  No fever.  05/01/2022 CT chest with contrast showed small to borderline enlarged mediastinal lymph nodes. 04/17/2023, cardiac coronary CTA showed indeterminate mildly enlarged right-sided subcarinal lymph node measuring 1.4 cm, previously 1.2 cm and a stable 1 cm right hilar lymph node.  MEDICAL HISTORY:  Past Medical History:  Diagnosis Date   Generalized osteoarthritis of multiple sites     SURGICAL HISTORY: Past Surgical History:  Procedure Laterality Date   KNEE ARTHROSCOPY Right 06/21/1989   LUMBAR DISC SURGERY  06/21/1969   TONSILLECTOMY     removed as a teenager   TOTAL KNEE ARTHROPLASTY Right 09/17/2021   Procedure: RIGHT TOTAL KNEE ARTHROPLASTY;  Surgeon: Bryan Kay HERO, MD;  Location: [facility] OR;  Service: Orthopedics;  Laterality: Right;    SOCIAL HISTORY: Social History   Socioeconomic History   Marital status: Widowed    Spouse name: Not on file   Number of children: 4   Years of education: Not on file   Highest education level: Not on file  Occupational History   Occupation: Service work--mechanic    Comment: tired  Tobacco Use   Smoking status: Former    Current packs/day: 0.00    Average packs/day: 1 pack/day for 40.0 years (40.0 ttl pk-yrs)    Types: Cigarettes    Start date: 86    Quit date: 7    Years since quitting: 35.7    Passive exposure: Past   Smokeless tobacco: Former    Types: Chew    Quit date: 08/21/2021  Vaping Use   Vaping status: Never Used  Substance and Sexual Activity   Alcohol use: No    Alcohol/week: 0.0 standard drinks of alcohol   Drug use: Never   Sexual activity: Not on file  Other Topics Concern   Not on file  Social History Narrative   Widowed 1/22      Has living will   Daughter Bryan Thomas is health  care POA   Would accept resuscitation attempts--but no prolonged artificial ventilation   No tube feeds if cognitively unaware   Social Drivers of Health   Financial Resource Strain: Low Risk  (03/04/2024)   Overall Financial Resource Strain (CARDIA)    Difficulty of Paying Living Expenses: Not hard at all  Food Insecurity: No Food Insecurity (03/04/2024)   Hunger Vital Sign    Worried About Running Out of Food in the Last Year: Never true    Ran Out of Food in the Last Year: Never true  Transportation Needs: No Transportation Needs (03/04/2024)   PRAPARE - Administrator, Civil Service (Medical): No    Lack of Transportation (Non-Medical): No  Physical Activity: Inactive (03/04/2024)   Exercise Vital Sign  Days of Exercise per Week: 0 days    Minutes of Exercise per Session: 0 min  Stress: No Stress Concern Present (03/04/2024)   [employer] of Occupational Health - Occupational Stress Questionnaire    Feeling of Stress : Not at all  Social Connections: Moderately Isolated (03/04/2024)   Social Connection and Isolation Panel    Frequency of Communication with Friends and Family: More than three times a week    Frequency of Social Gatherings with Friends and Family: More than three times a week    Attends Religious Services: More than 4 times per year    Active Member of [employer] or Organizations: No    Attends Banker Meetings: Never    Marital Status: Widowed  Intimate Partner Violence: Not At Risk (03/04/2024)   Humiliation, Afraid, Rape, and Kick questionnaire    Fear of Current or Ex-Partner: No    Emotionally Abused: No    Physically Abused: No    Sexually Abused: No    FAMILY HISTORY: Family History  Problem Relation Age of Onset   Cancer Mother    Breast cancer Mother    Arthritis Mother    Aneurysm Father    Diabetes Sister    Diabetes Brother    Heart disease Brother        CABG   Heart disease Brother    Diabetes Brother     Diabetes Brother     ALLERGIES:  is allergic to oxycodone .  MEDICATIONS:  Current Outpatient Medications  Medication Sig Dispense Refill   aspirin  EC 81 MG tablet Take 1 tablet (81 mg total) by mouth daily. Swallow whole. 90 tablet 3   cholecalciferol (VITAMIN D3) 25 MCG (1000 UNIT) tablet Take 1,000 Units by mouth daily.     finasteride (PROSCAR) 5 MG tablet Take 5 mg by mouth daily.     vitamin B-12 (CYANOCOBALAMIN ) 250 MCG tablet Take 250 mcg by mouth daily.     amoxicillin -clavulanate (AUGMENTIN ) 875-125 MG tablet Take 1 tablet by mouth 2 (two) times daily. (Patient not taking: Reported on 07/13/2024) 20 tablet 0   No current facility-administered medications for this visit.  Patient does not take any medications currently.  Review of Systems  Constitutional:  Negative for appetite change, chills, fatigue, fever and unexpected weight change.  HENT:   Negative for hearing loss and voice change.   Eyes:  Negative for eye problems and icterus.  Respiratory:  Negative for chest tightness, cough and shortness of breath.   Cardiovascular:  Negative for chest pain and leg swelling.  Gastrointestinal:  Negative for abdominal distention and abdominal pain.  Endocrine: Negative for hot flashes.  Genitourinary:  Negative for difficulty urinating, dysuria and frequency.   Musculoskeletal:  Negative for arthralgias.  Skin:  Negative for itching and rash.  Neurological:  Negative for light-headedness and numbness.  Hematological:  Negative for adenopathy. Does not bruise/bleed easily.  Psychiatric/Behavioral:  Negative for confusion.      PHYSICAL EXAMINATION: ECOG PERFORMANCE STATUS: 1 - Symptomatic but completely ambulatory  Vitals:   07/13/24 1353  BP: (!) 147/73  Pulse: 63  Resp: 18  Temp: 97.9 F (36.6 C)  SpO2: 95%   Filed Weights   07/13/24 1353  Weight: 259 lb 1.6 oz (117.5 kg)    Physical Exam Constitutional:      General: He is not in acute distress.     Appearance: He is obese. He is not diaphoretic.  HENT:     Head: Normocephalic and  atraumatic.     Nose: Nose normal.     Mouth/Throat:     Pharynx: No oropharyngeal exudate.  Eyes:     General: No scleral icterus.    Pupils: Pupils are equal, round, and reactive to light.  Cardiovascular:     Rate and Rhythm: Normal rate and regular rhythm.     Heart sounds: No murmur heard. Pulmonary:     Effort: Pulmonary effort is normal. No respiratory distress.     Breath sounds: No rales.  Chest:     Chest wall: No tenderness.  Abdominal:     General: There is no distension.     Palpations: Abdomen is soft.     Tenderness: There is no abdominal tenderness.  Musculoskeletal:        General: Normal range of motion.     Cervical back: Normal range of motion and neck supple.  Skin:    General: Skin is warm and dry.     Findings: No erythema.  Neurological:     Mental Status: He is alert and oriented to person, place, and time.     Cranial Nerves: No cranial nerve deficit.     Motor: No abnormal muscle tone.     Coordination: Coordination normal.  Psychiatric:        Mood and Affect: Affect normal.      LABORATORY DATA:  I have reviewed the data as listed     Latest Ref Rng & Units 07/13/2024    2:44 PM 06/15/2024   10:00 AM 04/17/2023   11:08 AM  CBC  WBC 4.0 - 10.5 K/uL 14.1  14.8    14.9  11.8   Hemoglobin 13.0 - 17.0 g/dL 85.0  84.4    84.2  85.7   Hematocrit 39.0 - 52.0 % 44.4  46.5    46.2  43.7   Platelets 150 - 400 K/uL 230  270    277  255       Latest Ref Rng & Units 06/15/2024   10:00 AM 04/17/2023   11:26 AM 04/17/2023   11:08 AM  CMP  Glucose 70 - 99 mg/dL 817   881   BUN 8 - 27 mg/dL 14   15   Creatinine 9.23 - 1.27 mg/dL 9.16   9.22   Sodium 865 - 144 mmol/L 139   135   Potassium 3.5 - 5.2 mmol/L 4.7   4.3   Chloride 96 - 106 mmol/L 101   105   CO2 20 - 29 mmol/L 21   23   Calcium  8.6 - 10.2 mg/dL 9.5   8.9   Total Protein 6.0 - 8.5 g/dL 7.0  6.4     Total Bilirubin 0.0 - 1.2 mg/dL 0.5  0.7    Alkaline Phos 44 - 121 IU/L 136  87    AST 0 - 40 IU/L 21  20    ALT 0 - 44 IU/L 24  20       RADIOGRAPHIC STUDIES: I have personally reviewed the radiological images as listed and agreed with the findings in the report. DG Chest 2 View Result Date: 06/24/2024 CLINICAL DATA:  Shortness of breath. EXAM: CHEST - 2 VIEW COMPARISON:  04/17/2023. FINDINGS: The heart size and mediastinal contours are unchanged. Similar streaky left basilar linear atelectasis/scarring. No focal consolidation, pleural effusion, or pneumothorax. No acute osseous abnormality. IMPRESSION: No acute cardiopulmonary findings. Electronically Signed   By: Harrietta Sherry M.D.   On: 06/24/2024 16:27

## 2024-07-13 NOTE — Assessment & Plan Note (Addendum)
 Diagnosis of CLL was reviewed and discussed with patient Currently he has night sweats, no unintentional weight loss or fever. Total white count 14.8.  I recommend watchful waiting.  Obtain ultrasound abdomen for further evaluation.  Night sweat could be secondary to COVID-19 infection.  If symptom is persistent, could consider trigger treatments. Check IgHV and CLL FISH panel.

## 2024-07-15 DIAGNOSIS — H5211 Myopia, right eye: Secondary | ICD-10-CM | POA: Diagnosis not present

## 2024-07-15 DIAGNOSIS — E119 Type 2 diabetes mellitus without complications: Secondary | ICD-10-CM | POA: Diagnosis not present

## 2024-07-15 DIAGNOSIS — H2513 Age-related nuclear cataract, bilateral: Secondary | ICD-10-CM | POA: Diagnosis not present

## 2024-07-15 LAB — HM DIABETES EYE EXAM

## 2024-07-16 ENCOUNTER — Ambulatory Visit
Admission: RE | Admit: 2024-07-16 | Discharge: 2024-07-16 | Disposition: A | Discharge status: Home / Self Care | Source: Ambulatory Visit | Attending: Oncology | Admitting: Oncology

## 2024-07-16 DIAGNOSIS — C9110 Chronic lymphocytic leukemia of B-cell type not having achieved remission: Secondary | ICD-10-CM | POA: Insufficient documentation

## 2024-07-16 DIAGNOSIS — K7689 Other specified diseases of liver: Secondary | ICD-10-CM | POA: Diagnosis not present

## 2024-07-16 DIAGNOSIS — R161 Splenomegaly, not elsewhere classified: Secondary | ICD-10-CM | POA: Diagnosis not present

## 2024-07-20 LAB — FISH HES LEUKEMIA, 4Q12 REA

## 2024-07-23 LAB — MISC LABCORP TEST (SEND OUT): Labcorp test code: 113753

## 2024-08-17 ENCOUNTER — Telehealth: Payer: Self-pay

## 2024-08-17 ENCOUNTER — Other Ambulatory Visit (INDEPENDENT_AMBULATORY_CARE_PROVIDER_SITE_OTHER): Payer: Self-pay

## 2024-08-17 ENCOUNTER — Ambulatory Visit: Admitting: Orthopaedic Surgery

## 2024-08-17 DIAGNOSIS — M1712 Unilateral primary osteoarthritis, left knee: Secondary | ICD-10-CM

## 2024-08-17 NOTE — Progress Notes (Signed)
 Office Visit Note   Patient: Bryan Thomas           Date of Birth: [birth year]           MRN: [id number] Visit Date: 08/17/2024              Requested by: Chandra Toribio POUR, MD [address] [place],  [place] [id number] PCP: Chandra Toribio POUR, MD   Assessment & Plan: Visit Diagnoses:  1. Primary osteoarthritis of left knee     Plan: History of Present Illness Bryan Thomas is an 83 year old male with severe left knee arthritis who presents for evaluation of left knee pain.  He experiences persistent left knee pain affecting the entire knee without recent injuries or falls. Previous cortisone injections in his other knee provided temporary relief but were not effective long-term.  He has undergone right knee replacement which I did a few years back and he is pleased with the outcome.  He is not on any blood thinners except for baby aspirin .  Physical Exam MUSCULOSKELETAL: No swelling in the left knee.  Pain and crepitus throughout range of motion.  Collaterals and cruciates are stable.  No joint effusion.  Assessment and Plan Severe left knee osteoarthritis Chronic severe osteoarthritis confirmed by X-rays. Previous cortisone injections provided temporary relief but ineffective long-term. - Discussed treatment options: cortisone injections, arthritis medications, knee replacement surgery. - Explained cortisone injections are not curative, limited to 3-4 per year. - Knee replacement surgery offers more permanent solution, requires recovery. - Obtain surgical clearance from Dr. Flynn and Dr. Babara.  Impression is severe left knee degenerative joint disease secondary to Osteoarthritis.  Patient has attempted conservative treatment for at least 6 consecutive weeks within the past 12 weeks, including but not limited to physical therapy, home exercise program, NSAIDs, activity modification, and/or corticosteroid injections. Despite these efforts, symptoms have not improved or have  worsened. Conservative measures have been deemed unsuccessful at this time. After a detailed discussion covering diagnosis and treatment options--including the risks, benefits, alternatives, and potential complications of surgical and nonsurgical management--the patient elected to proceed with surgery  Anticoagulants: No antithrombotic Postop anticoagulation: Eliquis Diabetic: No  Nickel allergy: No Prior DVT/PE: No Tobacco use: No Clearances needed for surgery: PCP, oncologist for CLL Anticipated discharge dispo: Home   Follow-Up Instructions: No follow-ups on file.   Orders:  Orders Placed This Encounter  Procedures   XR KNEE 3 VIEW LEFT   No orders of the defined types were placed in this encounter.     Procedures: No procedures performed   Clinical Data: No additional findings.   Subjective: Chief Complaint  Patient presents with   Left Knee - Pain    HPI  Review of Systems  Constitutional: Negative.   HENT: Negative.    Eyes: Negative.   Respiratory: Negative.    Cardiovascular: Negative.   Gastrointestinal: Negative.   Endocrine: Negative.   Genitourinary: Negative.   Skin: Negative.   Allergic/Immunologic: Negative.   Neurological: Negative.   Hematological: Negative.   Psychiatric/Behavioral: Negative.    All other systems reviewed and are negative.    Objective: Vital Signs: There were no vitals taken for this visit.  Physical Exam Vitals and nursing note reviewed.  Constitutional:      Appearance: He is well-developed.  HENT:     Head: Normocephalic and atraumatic.  Eyes:     Pupils: Pupils are equal, round, and reactive to light.  Pulmonary:  Effort: Pulmonary effort is normal.  Abdominal:     Palpations: Abdomen is soft.  Musculoskeletal:        General: Normal range of motion.     Cervical back: Neck supple.  Skin:    General: Skin is warm.  Neurological:     Mental Status: He is alert and oriented to person, place, and  time.  Psychiatric:        Behavior: Behavior normal.        Thought Content: Thought content normal.        Judgment: Judgment normal.     Ortho Exam  Specialty Comments:  No specialty comments available.  Imaging: XR KNEE 3 VIEW LEFT Result Date: 08/17/2024 X-rays of the left knee show advanced tricompartmental osteoarthritis.  Bone-on-bone joint space narrowing.  Kellgren-Lawrence stage IV    PMFS History: Patient Active Problem List   Diagnosis Date Noted   Pneumonia due to COVID-19 virus 06/24/2024   Shortness of breath 06/24/2024   Neuropathy 06/15/2024   Low platelet count 06/15/2024   BPH associated with nocturia 05/17/2023   CAD (coronary artery disease) 05/17/2023   Chronic fatigue 05/17/2023   Dyspnea on exertion 01/15/2023   Type 2 diabetes mellitus with hyperglycemia, without long-term current use of insulin (HCC) 01/15/2023   Chest pain 01/08/2023   Dizziness 01/08/2023   Other headache syndrome 01/08/2023   Mediastinal lymphadenopathy 04/01/2022   CLL (chronic lymphocytic leukemia) (HCC) 04/01/2022   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   Pre-op examination 09/10/2021   Cough 09/10/2021   Primary osteoarthritis of left knee 08/07/2021   Primary osteoarthritis of right knee 08/07/2021   Sensory loss 02/13/2016   Generalized osteoarthritis of multiple sites    Past Medical History:  Diagnosis Date   Generalized osteoarthritis of multiple sites     Family History  Problem Relation Age of Onset   Cancer Mother    Breast cancer Mother    Arthritis Mother    Aneurysm Father    Diabetes Sister    Diabetes Brother    Heart disease Brother        CABG   Heart disease Brother    Diabetes Brother    Diabetes Brother     Past Surgical History:  Procedure Laterality Date   KNEE ARTHROSCOPY Right 06/21/1989   LUMBAR DISC SURGERY  06/21/1969   TONSILLECTOMY     removed as a teenager   TOTAL KNEE ARTHROPLASTY Right 09/17/2021    Procedure: RIGHT TOTAL KNEE ARTHROPLASTY;  Surgeon: Jerri Kay HERO, MD;  Location: [facility] OR;  Service: Orthopedics;  Laterality: Right;   Social History   Occupational History   Occupation: Paramedic    Comment: tired  Tobacco Use   Smoking status: Former    Current packs/day: 0.00    Average packs/day: 1 pack/day for 40.0 years (40.0 ttl pk-yrs)    Types: Cigarettes    Start date: 57    Quit date: 1990    Years since quitting: 35.8    Passive exposure: Past   Smokeless tobacco: Former    Types: Chew    Quit date: 08/21/2021  Vaping Use   Vaping status: Never Used  Substance and Sexual Activity   Alcohol use: No    Alcohol/week: 0.0 standard drinks of alcohol   Drug use: Never   Sexual activity: Not on file

## 2024-08-17 NOTE — Telephone Encounter (Signed)
 Pt daughter drop clearance form off for Left TKA. I advised her if an appt was needed I would give them a call to schedule.  Please advise

## 2024-08-17 NOTE — Telephone Encounter (Signed)
 Patient given surgical clearance forms for PCP and oncology Dr.Yu. Aware that we must receive clearance forms back before being able to proceed with scheduling surgery.

## 2024-08-23 ENCOUNTER — Encounter: Payer: Self-pay | Admitting: Radiology

## 2024-08-31 ENCOUNTER — Ambulatory Visit

## 2024-08-31 VITALS — Ht 74.0 in | Wt 259.0 lb

## 2024-08-31 DIAGNOSIS — H6121 Impacted cerumen, right ear: Secondary | ICD-10-CM

## 2024-09-01 ENCOUNTER — Encounter: Payer: Self-pay | Admitting: Family Medicine

## 2024-09-01 NOTE — Progress Notes (Signed)
 Patient was here for a ear irrigation   Left ear canal normal.  Right ear canal with impacted cerumen that was impacting his ability to use hearing aid.  Right cerumen was removed with combination of lavage and curette.  Pt tolerated procedure well.

## 2024-10-26 ENCOUNTER — Inpatient Hospital Stay

## 2024-10-26 ENCOUNTER — Inpatient Hospital Stay: Admitting: Oncology

## 2024-12-10 ENCOUNTER — Inpatient Hospital Stay

## 2024-12-10 ENCOUNTER — Inpatient Hospital Stay: Admitting: Oncology

## 2024-12-16 ENCOUNTER — Ambulatory Visit: Admitting: Family Medicine

## 2025-03-31 ENCOUNTER — Ambulatory Visit
